# Patient Record
Sex: Female | Born: 1950
Health system: Southern US, Community
[De-identification: ages and names within clinical notes are randomized; demographics above are authoritative.]

## PROBLEM LIST (undated history)

## (undated) DIAGNOSIS — K5900 Constipation, unspecified: Secondary | ICD-10-CM

## (undated) DIAGNOSIS — R3915 Urgency of urination: Secondary | ICD-10-CM

## (undated) DIAGNOSIS — R310 Gross hematuria: Secondary | ICD-10-CM

## (undated) DIAGNOSIS — D494 Neoplasm of unspecified behavior of bladder: Secondary | ICD-10-CM

## (undated) DIAGNOSIS — C801 Malignant (primary) neoplasm, unspecified: Secondary | ICD-10-CM

## (undated) DIAGNOSIS — I10 Essential (primary) hypertension: Secondary | ICD-10-CM

## (undated) DIAGNOSIS — D62 Acute posthemorrhagic anemia: Secondary | ICD-10-CM

## (undated) HISTORY — PX: TUBAL LIGATION: SHX77

---

## 2001-04-20 ENCOUNTER — Emergency Department (HOSPITAL_COMMUNITY): Admission: EM | Admit: 2001-04-20 | Discharge: 2001-04-20 | Payer: Self-pay | Admitting: Emergency Medicine

## 2001-08-09 ENCOUNTER — Emergency Department (HOSPITAL_COMMUNITY): Admission: EM | Admit: 2001-08-09 | Discharge: 2001-08-09 | Payer: Self-pay | Admitting: Emergency Medicine

## 2002-08-13 ENCOUNTER — Emergency Department (HOSPITAL_COMMUNITY): Admission: EM | Admit: 2002-08-13 | Discharge: 2002-08-13 | Payer: Self-pay | Admitting: Emergency Medicine

## 2007-10-15 ENCOUNTER — Emergency Department (HOSPITAL_COMMUNITY): Admission: EM | Admit: 2007-10-15 | Discharge: 2007-10-15 | Payer: Self-pay | Admitting: Emergency Medicine

## 2009-09-13 ENCOUNTER — Emergency Department (HOSPITAL_COMMUNITY): Admission: EM | Admit: 2009-09-13 | Discharge: 2009-09-13 | Payer: Self-pay | Admitting: Emergency Medicine

## 2009-11-12 ENCOUNTER — Emergency Department (HOSPITAL_COMMUNITY): Admission: EM | Admit: 2009-11-12 | Discharge: 2009-11-13 | Payer: Self-pay | Admitting: Emergency Medicine

## 2010-12-15 ENCOUNTER — Emergency Department (HOSPITAL_COMMUNITY)
Admission: EM | Admit: 2010-12-15 | Discharge: 2010-12-15 | Payer: Self-pay | Source: Home / Self Care | Admitting: Emergency Medicine

## 2011-03-08 LAB — POCT I-STAT, CHEM 8
BUN: 21 mg/dL (ref 6–23)
Calcium, Ion: 1.11 mmol/L — ABNORMAL LOW (ref 1.12–1.32)
Creatinine, Ser: 1.1 mg/dL (ref 0.4–1.2)
Glucose, Bld: 112 mg/dL — ABNORMAL HIGH (ref 70–99)
Sodium: 140 mEq/L (ref 135–145)
TCO2: 30 mmol/L (ref 0–100)

## 2011-10-06 LAB — RPR: RPR Ser Ql: NONREACTIVE

## 2011-10-06 LAB — BASIC METABOLIC PANEL
CO2: 29
Calcium: 9.4
Glucose, Bld: 131 — ABNORMAL HIGH
Potassium: 4.1
Sodium: 135

## 2011-10-06 LAB — WET PREP, GENITAL: Trich, Wet Prep: NONE SEEN

## 2011-10-06 LAB — CBC
MCHC: 33.1
MCV: 79.1
RBC: 5.08
RDW: 15.4 — ABNORMAL HIGH

## 2011-10-06 LAB — GC/CHLAMYDIA PROBE AMP, GENITAL: Chlamydia, DNA Probe: NEGATIVE

## 2011-10-06 LAB — DIFFERENTIAL
Basophils Relative: 0
Eosinophils Absolute: 0.1
Monocytes Relative: 6
Neutrophils Relative %: 79 — ABNORMAL HIGH

## 2012-02-18 ENCOUNTER — Emergency Department (HOSPITAL_COMMUNITY)
Admission: EM | Admit: 2012-02-18 | Discharge: 2012-02-18 | Disposition: A | Discharge status: Home / Self Care | Payer: Self-pay | Attending: Emergency Medicine | Admitting: Emergency Medicine

## 2012-02-18 ENCOUNTER — Encounter (HOSPITAL_COMMUNITY): Payer: Self-pay

## 2012-02-18 DIAGNOSIS — S161XXA Strain of muscle, fascia and tendon at neck level, initial encounter: Secondary | ICD-10-CM

## 2012-02-18 DIAGNOSIS — I10 Essential (primary) hypertension: Secondary | ICD-10-CM

## 2012-02-18 DIAGNOSIS — X58XXXA Exposure to other specified factors, initial encounter: Secondary | ICD-10-CM | POA: Insufficient documentation

## 2012-02-18 DIAGNOSIS — S139XXA Sprain of joints and ligaments of unspecified parts of neck, initial encounter: Secondary | ICD-10-CM | POA: Insufficient documentation

## 2012-02-18 HISTORY — DX: Essential (primary) hypertension: I10

## 2012-02-18 LAB — RENAL FUNCTION PANEL
Albumin: 4 g/dL (ref 3.5–5.2)
Chloride: 98 mEq/L (ref 96–112)
GFR calc non Af Amer: >90 mL/min (ref >90)
Potassium: 3.4 mEq/L — ABNORMAL LOW (ref 3.5–5.1)

## 2012-02-18 MED ORDER — CLONIDINE HCL 0.1 MG PO TABS
0.1000 mg | ORAL_TABLET | Freq: Once | ORAL | Status: AC
  Administered 2012-02-18: 0.1 mg via ORAL
  Filled 2012-02-18: qty 1

## 2012-02-18 MED ORDER — OXYCODONE-ACETAMINOPHEN 5-325 MG PO TABS
1.0000 | ORAL_TABLET | Freq: Once | ORAL | Status: AC
  Administered 2012-02-18: 1 via ORAL
  Filled 2012-02-18: qty 1

## 2012-02-18 MED ORDER — CYCLOBENZAPRINE HCL 10 MG PO TABS: 10.0000 mg | ORAL_TABLET | Freq: Two times a day (BID) | ORAL | Status: AC | PRN

## 2012-02-18 MED ORDER — TRAMADOL HCL 50 MG PO TABS: 50.0000 mg | ORAL_TABLET | Freq: Four times a day (QID) | ORAL | Status: AC | PRN

## 2012-02-18 MED ORDER — AMLODIPINE BESYLATE 10 MG PO TABS: 5.0000 mg | ORAL_TABLET | Freq: Every day | ORAL | Status: DC

## 2012-02-18 NOTE — ED Notes (Signed)
Pt aware of HTN no on meds- supposed to be on medication- does not have doctor- pt has no symptoms

## 2012-02-18 NOTE — ED Provider Notes (Signed)
History     CSN: [id number]  Arrival date & time 02/18/12  7829   First MD Initiated Contact with Patient 02/18/12 1826      Chief Complaint  Patient presents with  . Neck Pain    (Consider location/radiation/quality/duration/timing/severity/associated sxs/prior treatment) HPI   Pt presents to the ED with complaints of sleeping wrong on her neck. She woke up yesterday morning with the left side of her neck hurting. It hurts even worse when she moves iit. She denies history of neck problems, denies injury or MVC. The patient also incidentally is noted to be hypertensive at 242/110. The patient has not seen a PCP in 3 years and does not take any medications. She is not having any symptoms of chest pain, SOB, weakness, headache, abdominal pain. Past Medical History  Diagnosis Date  . Hypertension     History reviewed. No pertinent past surgical history.  History reviewed. No pertinent family history.  History  Substance Use Topics  . Smoking status: Never Smoker   . Smokeless tobacco: Not on file  . Alcohol Use: No    OB History    Grav Para Term Preterm Abortions TAB SAB Ect Mult Living                  Review of Systems  All other systems reviewed and are negative.    Allergies  Review of patient's allergies indicates no known allergies.  Home Medications   Current Outpatient Rx  Name Route Sig Dispense Refill  . IBUPROFEN 200 MG PO TABS Oral Take 800 mg by mouth every 6 (six) hours as needed. FOR PAIN    . AMLODIPINE BESYLATE 10 MG PO TABS Oral Take 0.5 tablets (5 mg total) by mouth daily. 30 tablet 0  . CYCLOBENZAPRINE HCL 10 MG PO TABS Oral Take 1 tablet (10 mg total) by mouth 2 (two) times daily as needed for muscle spasms. 20 tablet 0  . TRAMADOL HCL 50 MG PO TABS Oral Take 1 tablet (50 mg total) by mouth every 6 (six) hours as needed for pain. 15 tablet 0    BP 200/90  Pulse 83  Resp 18  Wt 175 lb (79.379 kg)  SpO2 99%  Physical Exam  Nursing  note and vitals reviewed. Constitutional: She is oriented to person, place, and time. She appears well-developed and well-nourished. No distress.  HENT:  Head: Normocephalic and atraumatic.  Eyes: Conjunctivae are normal. Pupils are equal, round, and reactive to light.  Neck: Full passive range of motion without pain. Neck supple. No JVD present. Muscular tenderness present. No tracheal tenderness and no spinous process tenderness present. Decreased range of motion (due to pain) present. No tracheal deviation and no edema present.    Cardiovascular: Normal rate, regular rhythm and normal pulses.   Pulmonary/Chest: Effort normal and breath sounds normal. No stridor. Chest wall is not dull to percussion. She exhibits no tenderness, no crepitus, no edema, no deformity and no retraction.  Abdominal: Soft. Normal appearance and bowel sounds are normal.  Neurological: She is oriented to person, place, and time. She has normal strength. GCS eye subscore is 4. GCS verbal subscore is 5. GCS motor subscore is 6.  Skin: Skin is warm, dry and intact.  Psychiatric: She has a normal mood and affect. Her speech is normal and behavior is normal. Judgment and thought content normal. Cognition and memory are normal.    ED Course  Procedures (including critical care time)  Labs Reviewed  RENAL  FUNCTION PANEL - Abnormal; Notable for the following:    Potassium 3.4 (*)    Glucose, Bld 111 (*)    All other components within normal limits   No results found.   1. Hypertension   2. Neck strain       MDM  I discussed patients blood pressure with Dr. Freida Busman who has advised me to get a Renal Function panel.   Pt is asymptomatic. Given 0.1 of catapress, BP went down to below 200/90. I have started patient on Norvasc 5mg  tab q day. Pt also given tramadol and flexeril for pain. Pt renal function came back normal.     Dorthula Matas, PA 02/18/12 2145

## 2012-02-18 NOTE — Discharge Instructions (Signed)
Arterial Hypertension Arterial hypertension (high blood pressure) is a condition of elevated pressure in your blood vessels. Hypertension over a long period of time is a risk factor for strokes, heart attacks, and heart failure. It is also the leading cause of kidney (renal) failure.  CAUSES   In Adults -- Over 90% of all hypertension has no known cause. This is called essential or primary hypertension. In the other 10% of people with hypertension, the increase in blood pressure is caused by another disorder. This is called secondary hypertension. Important causes of secondary hypertension are:   Heavy alcohol use.   Obstructive sleep apnea.   Hyperaldosterosim (Conn's syndrome).   Steroid use.   Chronic kidney failure.   Hyperparathyroidism.   Medications.   Renal artery stenosis.   Pheochromocytoma.   Cushing's disease.   Coarctation of the aorta.   Scleroderma renal crisis.   Licorice (in excessive amounts).   Drugs (cocaine, methamphetamine).  Your caregiver can explain any items above that apply to you.  In Children -- Secondary hypertension is more common and should always be considered.   Pregnancy -- Few women of childbearing age have high blood pressure. However, up to 10% of them develop hypertension of pregnancy. Generally, this will not harm the woman. It Even be a sign of 3 complications of pregnancy: preeclampsia, HELLP syndrome, and eclampsia. Follow up and control with medication is necessary.  SYMPTOMS   This condition normally does not produce any noticeable symptoms. It is usually found during a routine exam.   Malignant hypertension is a late problem of high blood pressure. It Monger have the following symptoms:   Headaches.   Blurred vision.   End-organ damage (this means your kidneys, heart, lungs, and other organs are being damaged).   Stressful situations can increase the blood pressure. If a person with normal blood pressure has their blood  pressure go up while being seen by their caregiver, this is often termed "white coat hypertension." Its importance is not known. It Alkire be related with eventually developing hypertension or complications of hypertension.   Hypertension is often confused with mental tension, stress, and anxiety.  DIAGNOSIS  The diagnosis is made by 3 separate blood pressure measurements. They are taken at least 1 week apart from each other. If there is organ damage from hypertension, the diagnosis Lemire be made without repeat measurements. Hypertension is usually identified by having blood pressure readings:  Above 140/90 mmHg measured in both arms, at 3 separate times, over a couple weeks.   Over 130/80 mmHg should be considered a risk factor and Grisanti require treatment in patients with diabetes.  Blood pressure readings over 120/80 mmHg are called "pre-hypertension" even in non-diabetic patients. To get a true blood pressure measurement, use the following guidelines. Be aware of the factors that can alter blood pressure readings.  Take measurements at least 1 hour after caffeine.   Take measurements 30 minutes after smoking and without any stress. This is another reason to quit smoking - it raises your blood pressure.   Use a proper cuff size. Ask your caregiver if you are not sure about your cuff size.   Most home blood pressure cuffs are automatic. They will measure systolic and diastolic pressures. The systolic pressure is the pressure reading at the start of sounds. Diastolic pressure is the pressure at which the sounds disappear. If you are elderly, measure pressures in multiple postures. Try sitting, lying or standing.   Sit at rest for a minimum of   5 minutes before taking measurements.   You should not be on any medications like decongestants. These are found in many cold medications.   Record your blood pressure readings and review them with your caregiver.  If you have hypertension:  Your caregiver  may do tests to be sure you do not have secondary hypertension (see "causes" above).   Your caregiver may also look for signs of metabolic syndrome. This is also called Syndrome X or Insulin Resistance Syndrome. You may have this syndrome if you have type 2 diabetes, abdominal obesity, and abnormal blood lipids in addition to hypertension.   Your caregiver will take your medical and family history and perform a physical exam.   Diagnostic tests may include blood tests (for glucose, cholesterol, potassium, and kidney function), a urinalysis, or an EKG. Other tests may also be necessary depending on your condition.  PREVENTION  There are important lifestyle issues that you can adopt to reduce your chance of developing hypertension:  Maintain a normal weight.   Limit the amount of salt (sodium) in your diet.   Exercise often.   Limit alcohol intake.   Get enough potassium in your diet. Discuss specific advice with your caregiver.   Follow a DASH diet (dietary approaches to stop hypertension). This diet is rich in fruits, vegetables, and low-fat dairy products, and avoids certain fats.  PROGNOSIS  Essential hypertension cannot be cured. Lifestyle changes and medical treatment can lower blood pressure and reduce complications. The prognosis of secondary hypertension depends on the underlying cause. Many people whose hypertension is controlled with medicine or lifestyle changes can live a normal, healthy life.  RISKS AND COMPLICATIONS  While high blood pressure alone is not an illness, it often requires treatment due to its short- and long-term effects on many organs. Hypertension increases your risk for:  CVAs or strokes (cerebrovascular accident).   Heart failure due to chronically high blood pressure (hypertensive cardiomyopathy).   Heart attack (myocardial infarction).   Damage to the retina (hypertensive retinopathy).   Kidney failure (hypertensive nephropathy).  Your caregiver can  explain list items above that apply to you. Treatment of hypertension can significantly reduce the risk of complications. TREATMENT   For overweight patients, weight loss and regular exercise are recommended. Physical fitness lowers blood pressure.   Mild hypertension is usually treated with diet and exercise. A diet rich in fruits and vegetables, fat-free dairy products, and foods low in fat and salt (sodium) can help lower blood pressure. Decreasing salt intake decreases blood pressure in a 1/3 of people.   Stop smoking if you are a smoker.  The steps above are highly effective in reducing blood pressure. While these actions are easy to suggest, they are difficult to achieve. Most patients with moderate or severe hypertension end up requiring medications to bring their blood pressure down to a normal level. There are several classes of medications for treatment. Blood pressure pills (antihypertensives) will lower blood pressure by their different actions. Lowering the blood pressure by 10 mmHg may decrease the risk of complications by as much as 25%. The goal of treatment is effective blood pressure control. This will reduce your risk for complications. Your caregiver will help you determine the best treatment for you according to your lifestyle. What is excellent treatment for one person, may not be for you. HOME CARE INSTRUCTIONS   Do not smoke.   Follow the lifestyle changes outlined in the "Prevention" section.   If you are on medications, follow the directions   carefully. Blood pressure medications must be taken as prescribed. Skipping doses reduces their benefit. It also puts you at risk for problems.   Follow up with your caregiver, as directed.   If you are asked to monitor your blood pressure at home, follow the guidelines in the "Diagnosis" section above.  SEEK MEDICAL CARE IF:   You think you are having medication side effects.   You have recurrent headaches or lightheadedness.     You have swelling in your ankles.   You have trouble with your vision.  SEEK IMMEDIATE MEDICAL CARE IF:   You have sudden onset of chest pain or pressure, difficulty breathing, or other symptoms of a heart attack.   You have a severe headache.   You have symptoms of a stroke (such as sudden weakness, difficulty speaking, difficulty walking).  MAKE SURE YOU:   Understand these instructions.   Will watch your condition.   Will get help right away if you are not doing well or get worse.  Document Released: 12020/09/905 Document Revised: 08/25/2011 Document Reviewed: 07/13/2007 [facility] Patient Information 2012 [place], [place].Cervical Sprain and Strain   A cervical sprain is an injury to the neck. The injury can include either over-stretching or even small tears in the ligaments that hold the bones of the neck in place. A strain affects muscles and tendons. Minor injuries usually only involve ligaments and muscles. Because the different parts of the neck are so close together, more severe injuries can involve both sprain and strain. These injuries can affect the muscles, ligaments, tendons, discs, and nerves in the neck. CAUSES  An injury may be the result of a direct blow or from certain habits that can lead to the symptoms noted above.  Injury from:   Contact sports (such as football, rugby, wrestling, hockey, auto racing, gymnastics, diving, martial arts, and boxing).   Motor vehicle accidents.   Whiplash injuries (see image at right). These are common. They occur when the neck is forcefully whipped or forced backward and/or forward.   Falls.   Lifestyle or awkward postures:   Cradling a telephone between the ear and shoulder.   Sitting in a chair that offers no support.   Working at an [employer] park manager station.   Activities that require hours of repeated or long periods of looking up (stretching the neck backward) or looking down (bending the head/neck forward).   SYMPTOMS   Pain, soreness, stiffness, or burning sensation in the front, back, or sides of the neck. This may develop immediately after injury. Onset of discomfort may also develop slowly and not begin for 24 hours or more.   Shoulder and/or upper back pain.   Limits to the normal movement of the neck.   Headache.   Dizziness.   Weakness and/or abnormal sensation (such as numbness or tingling) of one or both arms and/or hands.   Muscle spasm.   Difficulty with swallowing or chewing.   Tenderness and swelling at the injury site.  DIAGNOSIS  Most of the time, your caregiver can diagnose this problem with a careful history and examination. The history will include information about known problems (such as arthritis in the neck) or a previous neck injury. X-rays may be ordered to find out if there is a different problem. X-rays can also help to find problems with the bones of the neck not related to the injury or current symptoms. TREATMENT  Several treatment options are available to help pain, spasm, and other symptoms. They include:  Cold helps relieve pain and reduce inflammation. Cold should be applied for 10 to 15 minutes every 2 to 3 hours after any activity that aggravates your symptoms. Use ice packs or an ice massage. Place a towel or cloth in between your skin and the ice pack.   Medication:   Only take over-the-counter or prescription medicines for pain, discomfort, or fever as directed by your caregiver.   Pain relievers or muscle relaxants may be prescribed. Use only as directed and only as much as you need.   Change in the activity that caused the problem. This might include using a headset with a telephone so that the phone is not propped between your ear and shoulder.   Neck collar. Your caregiver may recommend temporary use of a soft cervical collar.   Work station. Changes may be needed in your work place. A better sitting position and/or better posture during work  may be part of your treatment.   Physical Therapy. Your caregiver may recommend physical therapy. This can include instructions in the use of stretching and strengthening exercises. Improvement in posture is important. Exercises and posture training can help stabilize the neck and strengthen muscles and keep symptoms from returning.  HOME CARE INSTRUCTIONS  Other than formal physical therapy, all treatments above can be done at home. Even when not at work, it is important to be conscious of your posture and of activities that can cause a return of symptoms. Most cervical sprains and/or strains are better in 1-3 weeks. As you improve and increase activities, doing a warm up and stretching before the activity will help prevent recurrent problems. SEEK MEDICAL CARE IF:   Pain is not effectively controlled with medication.   You feel unable to decrease pain medication over time as planned.   Activity level is not improving as planned and/or expected.  SEEK IMMEDIATE MEDICAL CARE IF:   While using medication, you develop any bleeding, stomach upset, or signs of an allergic reaction.   Symptoms get worse, become intolerable, and are not helped by medications.   New, unexplained symptoms develop.   You experience numbness, tingling, weakness, or paralysis of any part of your body.  MAKE SURE YOU:   Understand these instructions.   Will watch your condition.   Will get help right away if you are not doing well or get worse.  Document Released: 10/10/2007 Document Revised: 08/25/2011 Document Reviewed: 10/10/2007 [facility] Patient Information 2012 [place], [place].

## 2012-02-18 NOTE — ED Notes (Signed)
Pt denies injury- HX of the same.  EDPA Tiffany present for evaluation

## 2012-02-19 NOTE — ED Provider Notes (Signed)
Medical screening examination/treatment/procedure(s) were performed by non-physician practitioner and as supervising physician I was immediately available for consultation/collaboration.  Toy Baker, MD 02/19/12 2026

## 2013-07-13 ENCOUNTER — Other Ambulatory Visit: Payer: Self-pay

## 2013-07-13 ENCOUNTER — Encounter (HOSPITAL_COMMUNITY): Payer: Self-pay | Admitting: *Deleted

## 2013-07-13 ENCOUNTER — Emergency Department (HOSPITAL_COMMUNITY)
Admission: EM | Admit: 2013-07-13 | Discharge: 2013-07-13 | Disposition: A | Discharge status: Home / Self Care | Payer: BC Managed Care – PPO | Attending: Emergency Medicine | Admitting: Emergency Medicine

## 2013-07-13 DIAGNOSIS — R5381 Other malaise: Secondary | ICD-10-CM | POA: Insufficient documentation

## 2013-07-13 DIAGNOSIS — I10 Essential (primary) hypertension: Secondary | ICD-10-CM

## 2013-07-13 DIAGNOSIS — R197 Diarrhea, unspecified: Secondary | ICD-10-CM | POA: Insufficient documentation

## 2013-07-13 DIAGNOSIS — R110 Nausea: Secondary | ICD-10-CM | POA: Insufficient documentation

## 2013-07-13 LAB — BASIC METABOLIC PANEL
CO2: 29 mEq/L (ref 19–32)
GFR calc non Af Amer: 79 mL/min — ABNORMAL LOW (ref >90)
Glucose, Bld: 137 mg/dL — ABNORMAL HIGH (ref 70–99)
Potassium: 4.3 mEq/L (ref 3.5–5.1)
Sodium: 141 mEq/L (ref 135–145)

## 2013-07-13 LAB — CBC WITH DIFFERENTIAL/PLATELET
Eosinophils Absolute: 0.2 10*3/uL (ref 0.0–0.7)
Lymphocytes Relative: 25 % (ref 12–46)
Lymphs Abs: 2.6 10*3/uL (ref 0.7–4.0)
Neutrophils Relative %: 65 % (ref 43–77)
Platelets: 272 10*3/uL (ref 150–400)
RBC: 5.48 MIL/uL — ABNORMAL HIGH (ref 3.87–5.11)
WBC: 10.2 10*3/uL (ref 4.0–10.5)

## 2013-07-13 MED ORDER — SODIUM CHLORIDE 0.9 % IV BOLUS (SEPSIS)
1000.0000 mL | Freq: Once | INTRAVENOUS | Status: AC
  Administered 2013-07-13: 1000 mL via INTRAVENOUS

## 2013-07-13 MED ORDER — HYDROCHLOROTHIAZIDE 25 MG PO TABS: 25.0000 mg | ORAL_TABLET | Freq: Every day | ORAL | Status: DC

## 2013-07-13 MED ORDER — LABETALOL HCL 5 MG/ML IV SOLN
20.0000 mg | Freq: Once | INTRAVENOUS | Status: AC
  Administered 2013-07-13: 20 mg via INTRAVENOUS
  Filled 2013-07-13: qty 4

## 2013-07-13 NOTE — ED Notes (Signed)
Pt verbalizes understanding 

## 2013-07-13 NOTE — ED Provider Notes (Signed)
History    CSN: [id number] Arrival date & time 07/13/13  1452  First MD Initiated Contact with Patient 07/13/13 1621     Chief Complaint  Patient presents with  . hypertension 252/104   . general malaise    (Consider location/radiation/quality/duration/timing/severity/associated sxs/prior Treatment) Patient is a 62 y.o. female presenting with general illness.  Illness Quality:  Generalized malaise Severity:  Moderate Onset quality:  Gradual Duration:  3 days Timing:  Constant Progression:  Worsening Chronicity:  New Context:  Has been out of BP meds for about 1 year Relieved by:  Nothing Worsened by:  Nothing Associated symptoms: diarrhea and nausea   Associated symptoms: no abdominal pain, no chest pain, no congestion, no cough, no fever, no headaches, no shortness of breath and no vomiting    Past Medical History  Diagnosis Date  . Hypertension    History reviewed. No pertinent past surgical history. History reviewed. No pertinent family history. History  Substance Use Topics  . Smoking status: Never Smoker   . Smokeless tobacco: Not on file  . Alcohol Use: No   OB History   Grav Para Term Preterm Abortions TAB SAB Ect Mult Living                 Review of Systems  Constitutional: Negative for fever.  HENT: Negative for congestion.   Eyes: Negative for visual disturbance.  Respiratory: Negative for cough and shortness of breath.   Cardiovascular: Negative for chest pain.  Gastrointestinal: Positive for nausea and diarrhea. Negative for vomiting and abdominal pain.  Neurological: Negative for dizziness and headaches.  All other systems reviewed and are negative.    Allergies  Review of patient's allergies indicates no known allergies.  Home Medications  No current outpatient prescriptions on file. BP 180/136  Pulse 101  Temp(Src) 99.6 F (37.6 C) (Oral)  Resp 16  SpO2 98% Physical Exam  Nursing note and vitals reviewed. Constitutional: She is  oriented to person, place, and time. She appears well-developed and well-nourished. No distress.  HENT:  Head: Normocephalic and atraumatic.  Mouth/Throat: Oropharynx is clear and moist.  Eyes: Conjunctivae are normal. Pupils are equal, round, and reactive to light. No scleral icterus.  Fundus not well visualized on right, no papilledema on left.  Neck: Neck supple.  Cardiovascular: Normal rate, regular rhythm, normal heart sounds and intact distal pulses.   No murmur heard. Pulmonary/Chest: Effort normal and breath sounds normal. No stridor. No respiratory distress. She has no rales.  Abdominal: Soft. Bowel sounds are normal. She exhibits no distension. There is no tenderness.  Musculoskeletal: Normal range of motion.  Neurological: She is alert and oriented to person, place, and time.  Skin: Skin is warm and dry. No rash noted.  Psychiatric: She has a normal mood and affect. Her behavior is normal.    ED Course  Procedures (including critical care time) Labs Reviewed  CBC WITH DIFFERENTIAL - Abnormal; Notable for the following:    RBC 5.48 (*)    All other components within normal limits  BASIC METABOLIC PANEL - Abnormal; Notable for the following:    Glucose, Bld 137 (*)    GFR calc non Af Amer 79 (*)    All other components within normal limits  TROPONIN I   No results found.  EKG - NSR, rate 89, normal axis, normal intervals, no ST/T changes, no priors.    1. Hypertension   2. Malaise     MDM  62 yo female with  general malaise found to be hypertensive.  No symptoms or signs of end organ damage.  EKG unremarkable, labs unremarkable.  Pt very well appearing with benign exam.  Given dose of labetalol with response in BPs.  I suspect that she has dramatically elevated blood pressure at baseline in setting of being off of her BP meds for approx 1 year.  Have initiated HCTZ and provided resources for further outpatient management.  Return precautions given.   Candyce Churn, MD 07/14/13 [phone]

## 2013-07-13 NOTE — ED Notes (Addendum)
Pt reports she has been under a lot of stress. Has had 7 friends die in the last month. Pt has hx of HTN. Pt has not been to a doctor in over 2 years. Does not take BP medications. Denies pain, but has numbness in right hand since yesterday. Reports general malaise x3 days.

## 2016-11-24 ENCOUNTER — Observation Stay (HOSPITAL_COMMUNITY)
Admission: EM | Admit: 2016-11-24 | Discharge: 2016-11-26 | Disposition: A | Discharge status: Home / Self Care | Payer: BLUE CROSS/BLUE SHIELD | Attending: Emergency Medicine | Admitting: Emergency Medicine

## 2016-11-24 ENCOUNTER — Encounter (HOSPITAL_COMMUNITY): Payer: Self-pay | Admitting: *Deleted

## 2016-11-24 DIAGNOSIS — I10 Essential (primary) hypertension: Secondary | ICD-10-CM | POA: Diagnosis not present

## 2016-11-24 DIAGNOSIS — R319 Hematuria, unspecified: Secondary | ICD-10-CM

## 2016-11-24 DIAGNOSIS — D62 Acute posthemorrhagic anemia: Secondary | ICD-10-CM | POA: Diagnosis present

## 2016-11-24 DIAGNOSIS — Z79899 Other long term (current) drug therapy: Secondary | ICD-10-CM | POA: Diagnosis not present

## 2016-11-24 DIAGNOSIS — R310 Gross hematuria: Secondary | ICD-10-CM | POA: Diagnosis present

## 2016-11-24 DIAGNOSIS — D500 Iron deficiency anemia secondary to blood loss (chronic): Secondary | ICD-10-CM | POA: Diagnosis not present

## 2016-11-24 DIAGNOSIS — D649 Anemia, unspecified: Secondary | ICD-10-CM

## 2016-11-24 HISTORY — DX: Acute posthemorrhagic anemia: D62

## 2016-11-24 LAB — URINALYSIS, ROUTINE W REFLEX MICROSCOPIC
BILIRUBIN URINE: NEGATIVE
Glucose, UA: NEGATIVE mg/dL
KETONES UR: NEGATIVE mg/dL
NITRITE: NEGATIVE
PROTEIN: 100 mg/dL — AB
Specific Gravity, Urine: 1.017 (ref 1.005–1.030)
pH: 5.5 (ref 5.0–8.0)

## 2016-11-24 LAB — BASIC METABOLIC PANEL
ANION GAP: 6 (ref 5–15)
BUN: 7 mg/dL (ref 6–20)
CALCIUM: 9 mg/dL (ref 8.9–10.3)
CO2: 26 mmol/L (ref 22–32)
Chloride: 107 mmol/L (ref 101–111)
Creatinine, Ser: 0.89 mg/dL (ref 0.44–1.00)
GLUCOSE: 119 mg/dL — AB (ref 65–99)
Potassium: 3.7 mmol/L (ref 3.5–5.1)
SODIUM: 139 mmol/L (ref 135–145)

## 2016-11-24 LAB — CBC
HCT: 22.4 % — ABNORMAL LOW (ref 36.0–46.0)
Hemoglobin: 6.5 g/dL — CL (ref 12.0–15.0)
MCH: 19.9 pg — ABNORMAL LOW (ref 26.0–34.0)
MCHC: 29.9 g/dL — ABNORMAL LOW (ref 30.0–36.0)
MCV: 66.5 fL — AB (ref 78.0–100.0)
PLATELETS: 357 10*3/uL (ref 150–400)
RBC: 3.37 MIL/uL — AB (ref 3.87–5.11)
RDW: 17.2 % — AB (ref 11.5–15.5)
WBC: 7.1 10*3/uL (ref 4.0–10.5)

## 2016-11-24 LAB — URINE MICROSCOPIC-ADD ON

## 2016-11-24 MED ORDER — SODIUM CHLORIDE 0.9 % IV SOLN
10.0000 mL/h | Freq: Once | INTRAVENOUS | Status: AC
  Administered 2016-11-25: 10 mL/h via INTRAVENOUS

## 2016-11-24 NOTE — ED Triage Notes (Signed)
Pt c/o hematuria x 4 days with blood clots. Pt also reports fatigue. Pt noted to be hypertension, has been out of blood pressure medications for years

## 2016-11-24 NOTE — ED Provider Notes (Signed)
[facility] DEPT Provider Note   CSN: [id number] Arrival date & time: 11/24/16  2002     History   Chief Complaint Chief Complaint  Patient presents with  . Hematuria    HPI Faith [name] is a 65 y.o. female with a hx of HTN (unconctrolled) presents to the Emergency Department complaining of gradual, large volume hematuria with clots onset 4 days ago and resolving 2 days ago. Pt reports she has had approx 6 mos of increased urinary frequency without dysuria, hematuria (until 4 days ago), polyuria or urinary urgency.  Pt reports that over the last 6 mos she occasionally saw blood on the tissue after urination.  No vaginal bleeding.  She reports associated fatigue x 1 week.  She reports no PCP for several years and no medications for her HTN.  She denies SOB, DOE or CP.  Nothing makes her ssx better or worse.  No fever, chills, abd pain, flank pain.     The history is provided by the patient and medical records. No language interpreter was used.    Past Medical History:  Diagnosis Date  . Hypertension     Patient Active Problem List   Diagnosis Date Noted  . Acute blood loss anemia 11/25/2016  . HTN (hypertension) 11/25/2016  . Gross hematuria 11/25/2016    History reviewed. No pertinent surgical history.  OB History    No data available       Home Medications    Prior to Admission medications   Medication Sig Start Date End Date Taking? Authorizing Provider  hydrochlorothiazide (HYDRODIURIL) 25 MG tablet Take 1 tablet (25 mg total) by mouth daily. Patient not taking: Reported on 11/24/2016 07/13/13   Serita Grit, MD    Family History Family History  Problem Relation Age of Onset  . Lung cancer Father     Social History Social History  Substance Use Topics  . Smoking status: Never Smoker  . Smokeless tobacco: Never Used  . Alcohol use No     Allergies   Patient has no known allergies.   Review of Systems Review of Systems  Constitutional:  Positive for fatigue. Negative for fever.  Genitourinary: Positive for frequency ( x6 mos) and hematuria. Negative for dysuria, flank pain, pelvic pain and vaginal bleeding.  All other systems reviewed and are negative.    Physical Exam Updated Vital Signs BP (!) 192/163   Pulse 87   Temp 98.7 F (37.1 C) (Oral)   Resp 18   SpO2 100%   Physical Exam  Constitutional: She appears well-developed and well-nourished. No distress.  Awake, alert, nontoxic appearance  HENT:  Head: Normocephalic and atraumatic.  Mouth/Throat: Oropharynx is clear and moist. No oropharyngeal exudate.  Pale mucous membranes  Eyes: Conjunctivae are normal. No scleral icterus.  Neck: Normal range of motion. Neck supple.  Cardiovascular: Regular rhythm and intact distal pulses.  Tachycardia present.   Pulses:      Radial pulses are 2+ on the right side, and 2+ on the left side.       Dorsalis pedis pulses are 2+ on the right side, and 2+ on the left side.  Pulmonary/Chest: Effort normal and breath sounds normal. No respiratory distress. She has no wheezes.  Equal chest expansion  Abdominal: Soft. Bowel sounds are normal. She exhibits no mass. There is no tenderness. There is no rebound and no guarding.  Musculoskeletal: Normal range of motion. She exhibits no edema.  Neurological: She is alert.  Speech is clear and  goal oriented Moves extremities without ataxia  Skin: Skin is warm and dry. She is not diaphoretic. There is pallor.  Psychiatric: She has a normal mood and affect.  Nursing note and vitals reviewed.    ED Treatments / Results  Labs (all labs ordered are listed, but only abnormal results are displayed) Labs Reviewed  URINALYSIS, ROUTINE W REFLEX MICROSCOPIC (NOT AT [facility]) - Abnormal; Notable for the following:       Result Value   APPearance CLOUDY (*)    Hgb urine dipstick LARGE (*)    Protein, ur 100 (*)    Leukocytes, UA SMALL (*)    All other components within normal limits  CBC -  Abnormal; Notable for the following:    RBC 3.37 (*)    Hemoglobin 6.5 (*)    HCT 22.4 (*)    MCV 66.5 (*)    MCH 19.9 (*)    MCHC 29.9 (*)    RDW 17.2 (*)    All other components within normal limits  BASIC METABOLIC PANEL - Abnormal; Notable for the following:    Glucose, Bld 119 (*)    All other components within normal limits  URINE MICROSCOPIC-ADD ON - Abnormal; Notable for the following:    Squamous Epithelial / LPF 0-5 (*)    Bacteria, UA FEW (*)    All other components within normal limits  CBC - Abnormal; Notable for the following:    RBC 3.13 (*)    Hemoglobin 6.0 (*)    HCT 20.7 (*)    MCV 66.1 (*)    MCH 19.2 (*)    MCHC 29.0 (*)    RDW 17.4 (*)    All other components within normal limits  URINE CULTURE  PROTIME-INR  APTT  POC OCCULT BLOOD, ED  PREPARE RBC (CROSSMATCH)  TYPE AND SCREEN  ABO/RH    Radiology Ct Abdomen Pelvis W Contrast  Result Date: 11/25/2016 CLINICAL DATA:  Acute onset of hematuria.  Initial encounter. EXAM: CT ABDOMEN AND PELVIS WITH CONTRAST TECHNIQUE: Multidetector CT imaging of the abdomen and pelvis was performed using the standard protocol following bolus administration of intravenous contrast. CONTRAST:  100 mL ISOVUE-300 IOPAMIDOL (ISOVUE-300) INJECTION 61% COMPARISON:  None. FINDINGS: Lower chest: The visualized lung bases are grossly clear. The visualized portions of the mediastinum are unremarkable. Hepatobiliary: The liver is unremarkable in appearance. The gallbladder is unremarkable in appearance. The common bile duct remains normal in caliber. Pancreas: The pancreas is within normal limits. Spleen: The spleen is unremarkable in appearance. Adrenals/Urinary Tract: The adrenal glands are unremarkable in appearance. There is minimal right-sided hydronephrosis, with dilatation of the right ureter. This appears to reflect a partially obstructing mass at the right side of the bladder, overlying the right vesicoureteral junction. A large  left renal cyst is noted, measuring 5.3 cm in size. No nonobstructing renal stones are identified. No perinephric stranding is seen. Stomach/Bowel: The stomach is unremarkable in appearance. The small bowel is within normal limits. The appendix is normal in caliber, without evidence of appendicitis. The colon is unremarkable in appearance. Vascular/Lymphatic: Scattered calcification is seen along the abdominal aorta and its branches. The abdominal aorta is otherwise grossly unremarkable. The inferior vena cava is grossly unremarkable. No retroperitoneal lymphadenopathy is seen. No pelvic sidewall lymphadenopathy is identified. Reproductive: There appears to be a lobulated mass at the right posterior aspect of the bladder, measuring 4.7 x 2.3 x 3.2 cm. The bladder is otherwise unremarkable in appearance. The uterus is grossly unremarkable.  The ovaries are relatively symmetric. No suspicious adnexal masses are seen. Other: No additional soft tissue abnormalities are seen. Musculoskeletal: No acute osseous abnormalities are identified. Vacuum phenomenon is noted along the lumbar spine. The visualized musculature is unremarkable in appearance. IMPRESSION: 1. Apparent lobulated mass at the right posterior wall of the bladder, measuring 4.7 x 2.3 x 3.2 cm. This could reflect primary bladder malignancy, or possibly a large fungal ball. Cystoscopy and tissue diagnosis are recommended for further evaluation. 2. Minimal right-sided hydronephrosis, with dilatation of the right ureter. This appears to reflect the bladder mass, which overlies the right vesicoureteral junction. 3. Large left renal cyst noted. 4. Scattered aortic atherosclerosis. Electronically Signed   By: Garald Balding M.D.   On: 11/25/2016 01:40    Procedures Procedures (including critical care time)  CRITICAL CARE Performed by: Abigail Butts Total critical care time: 45 minutes Critical care time was exclusive of separately billable procedures  and treating other patients. Critical care was necessary to treat or prevent imminent or life-threatening deterioration. Critical care was time spent personally by me on the following activities: development of treatment plan with patient and/or surrogate as well as nursing, discussions with consultants, evaluation of patient's response to treatment, examination of patient, obtaining history from patient or surrogate, ordering and performing treatments and interventions, ordering and review of laboratory studies, ordering and review of radiographic studies, pulse oximetry and re-evaluation of patient's condition.   Medications Ordered in ED Medications  amLODipine (NORVASC) tablet 5 mg (5 mg Oral Given 11/25/16 0318)  hydrALAZINE (APRESOLINE) injection 10 mg (10 mg Intravenous Given 11/25/16 0556)  0.9 %  sodium chloride infusion (10 mL/hr Intravenous Transfusing/Transfer 11/25/16 0457)  iopamidol (ISOVUE-300) 61 % injection (100 mLs Intravenous Contrast Given 11/25/16 0110)     Initial Impression / Assessment and Plan / ED Course  I have reviewed the triage vital signs and the nursing notes.  Pertinent labs & imaging results that were available during my care of the patient were reviewed by me and considered in my medical decision making (see chart for details).  Clinical Course as of Nov 25 706  Wed Nov 24, 2016  2240 HTN noted, pt with hx of same and not on any medications BP: (!) 230/76 [HM]  2241 Significant anemia, no hx of same Hemoglobin: (!!) 6.5 [HM]  2241 normal Platelets: 357 [HM]  2241 No leukocytosis WBC: 7.1 [HM]    Clinical Course User Index [HM] Son Barkan, PA-C    Pt presents With gross hematuria and significant anemia. Patient also hypertension, uncontrolled. Hemoglobin 6.5 and blood initiated. CT scan shows lobulated mass at the right posterior wall of the bladder.  Patient admitted for further evaluation and treatment.  Final Clinical Impressions(s) / ED  Diagnoses   Final diagnoses:  Hematuria, unspecified type  Symptomatic anemia  Iron deficiency anemia due to chronic blood loss    New Prescriptions Current Discharge Medication List       Abigail Butts, PA-C 11/25/16 [id number]    Carmin Muskrat, MD 11/29/16 [phone]

## 2016-11-24 NOTE — ED Notes (Signed)
Lab called critical lab hgb 6.5 reported to EDP.

## 2016-11-25 ENCOUNTER — Encounter (HOSPITAL_COMMUNITY): Payer: Self-pay | Admitting: Internal Medicine

## 2016-11-25 ENCOUNTER — Observation Stay (HOSPITAL_COMMUNITY): Payer: BLUE CROSS/BLUE SHIELD

## 2016-11-25 DIAGNOSIS — I10 Essential (primary) hypertension: Secondary | ICD-10-CM | POA: Diagnosis not present

## 2016-11-25 DIAGNOSIS — D62 Acute posthemorrhagic anemia: Secondary | ICD-10-CM

## 2016-11-25 DIAGNOSIS — R310 Gross hematuria: Secondary | ICD-10-CM | POA: Diagnosis not present

## 2016-11-25 LAB — CBC
HEMATOCRIT: 20.7 % — AB (ref 36.0–46.0)
HEMATOCRIT: 27.4 % — AB (ref 36.0–46.0)
HEMOGLOBIN: 6 g/dL — AB (ref 12.0–15.0)
Hemoglobin: 8.7 g/dL — ABNORMAL LOW (ref 12.0–15.0)
MCH: 19.2 pg — AB (ref 26.0–34.0)
MCH: 22.3 pg — ABNORMAL LOW (ref 26.0–34.0)
MCHC: 29 g/dL — ABNORMAL LOW (ref 30.0–36.0)
MCHC: 31.8 g/dL (ref 30.0–36.0)
MCV: 66.1 fL — ABNORMAL LOW (ref 78.0–100.0)
MCV: 70.1 fL — AB (ref 78.0–100.0)
Platelets: 307 10*3/uL (ref 150–400)
Platelets: 323 10*3/uL (ref 150–400)
RBC: 3.13 MIL/uL — AB (ref 3.87–5.11)
RBC: 3.91 MIL/uL (ref 3.87–5.11)
RDW: 17.4 % — ABNORMAL HIGH (ref 11.5–15.5)
RDW: 19.7 % — AB (ref 11.5–15.5)
WBC: 7.7 10*3/uL (ref 4.0–10.5)
WBC: 8.5 10*3/uL (ref 4.0–10.5)

## 2016-11-25 LAB — PROTIME-INR
INR: 1.08
Prothrombin Time: 14.1 seconds (ref 11.4–15.2)

## 2016-11-25 LAB — APTT: aPTT: 25 seconds (ref 24–36)

## 2016-11-25 LAB — ABO/RH: ABO/RH(D): O POS

## 2016-11-25 LAB — PREPARE RBC (CROSSMATCH)

## 2016-11-25 LAB — POC OCCULT BLOOD, ED: FECAL OCCULT BLD: NEGATIVE

## 2016-11-25 MED ORDER — INFLUENZA VAC SPLIT QUAD 0.5 ML IM SUSY
0.5000 mL | PREFILLED_SYRINGE | INTRAMUSCULAR | Status: AC
  Administered 2016-11-26: 0.5 mL via INTRAMUSCULAR
  Filled 2016-11-25: qty 0.5

## 2016-11-25 MED ORDER — AMLODIPINE BESYLATE 5 MG PO TABS
5.0000 mg | ORAL_TABLET | Freq: Once | ORAL | Status: AC
  Administered 2016-11-25: 5 mg via ORAL
  Filled 2016-11-25: qty 1

## 2016-11-25 MED ORDER — AMLODIPINE BESYLATE 10 MG PO TABS
10.0000 mg | ORAL_TABLET | Freq: Every day | ORAL | Status: DC
  Administered 2016-11-26: 10 mg via ORAL
  Filled 2016-11-25: qty 1

## 2016-11-25 MED ORDER — IOPAMIDOL (ISOVUE-300) INJECTION 61%
INTRAVENOUS | Status: AC
  Administered 2016-11-25: 100 mL via INTRAVENOUS
  Filled 2016-11-25: qty 100

## 2016-11-25 MED ORDER — ALUM & MAG HYDROXIDE-SIMETH 200-200-20 MG/5ML PO SUSP
30.0000 mL | Freq: Four times a day (QID) | ORAL | Status: DC | PRN
  Administered 2016-11-25: 30 mL via ORAL
  Filled 2016-11-25: qty 30

## 2016-11-25 MED ORDER — ZOLPIDEM TARTRATE 5 MG PO TABS
5.0000 mg | ORAL_TABLET | Freq: Once | ORAL | Status: AC
  Administered 2016-11-25: 5 mg via ORAL
  Filled 2016-11-25: qty 1

## 2016-11-25 MED ORDER — AMLODIPINE BESYLATE 5 MG PO TABS
5.0000 mg | ORAL_TABLET | Freq: Every day | ORAL | Status: DC
  Administered 2016-11-25: 5 mg via ORAL
  Filled 2016-11-25: qty 1

## 2016-11-25 MED ORDER — HYDRALAZINE HCL 20 MG/ML IJ SOLN
10.0000 mg | INTRAMUSCULAR | Status: DC | PRN
  Administered 2016-11-25: 10 mg via INTRAVENOUS
  Filled 2016-11-25: qty 1

## 2016-11-25 NOTE — ED Notes (Signed)
Attempted to call report x2

## 2016-11-25 NOTE — Care Management Note (Signed)
Case Management Note  Patient Details  Name: Faith [name] MRN: [id number] Date of Birth: December 29, 1950  Subjective/Objective:    Acute blood loss anemia, gross hematuria d/t bladder tumor                Action/Plan: Discharge Planning: NCM spoke to pt and lives at home with adult son, Bobby Rumpf. Pt states she was independent prior to admission. Still works part-time. No problems affording medications. Provided info on locating PCP. Pt plans to contact Dr. Glendale Chard for new pt appt.    Expected Discharge Date:               Expected Discharge Plan:  Home/Self Care  In-House Referral:  NA  Discharge planning Services  CM Consult  Post Acute Care Choice:  NA Choice offered to:  NA  DME Arranged:  N/A DME Agency:  NA  HH Arranged:  NA HH Agency:  NA  Status of Service:  Completed, signed off  If discussed at [place] of Stay Meetings, dates discussed:    Additional Comments:  Erenest Rasher, RN 11/25/2016, 12:03 PM

## 2016-11-25 NOTE — ED Notes (Signed)
Attempted to call report x 1  

## 2016-11-25 NOTE — Progress Notes (Signed)
[facility] PROGRESS NOTE  Faith [name] [id number] DOB: [birth year] DOA: 11/24/2016  PCP: No PCP Per Patient  Brief History/Interval Summary: 65 year old African-American female with a past medical history of hypertension for which she is not taking any medications at this time, who presented to the emergency department with complaints of hematuria. She also had complains of fatigue. Patient was found to be quite anemic. She was hospitalized for further management.  Reason for Visit: Acute blood loss anemia  Consultants: Phone discussion with urology, Dr. Alyson Ingles  Procedures: None yet  Antibiotics: None  Subjective/Interval History: Patient continues to feel fatigued. Has not had any further blood in the urine. Denies any abdominal pain. Denies any nausea or vomiting. Denies any vaginal bleeding or discharge.  ROS: Denies any chest pain or shortness of breath.  Objective:  Vital Signs  Vitals:   11/25/16 0556 11/25/16 0600 11/25/16 0615 11/25/16 0844  BP: (!) 193/79 (!) 184/72 (!) 184/72 (!) 176/69  Pulse: 77 77 80 85  Resp: 19 14 18 19   Temp:  98.5 F (36.9 C) 98.9 F (37.2 C) 98.8 F (37.1 C)  TempSrc:   Oral   SpO2: 97% 99% 99% 98%  Weight:      Height:        Intake/Output Summary (Last 24 hours) at 11/25/16 1124 Last data filed at 11/25/16 0840  Gross per 24 hour  Intake              700 ml  Output                0 ml  Net              700 ml   Filed Weights   11/25/16 0531  Weight: 84.3 kg (185 lb 13.6 oz)    General appearance: alert, cooperative, appears stated age and no distress Resp: clear to auscultation bilaterally Cardio: regular rate and rhythm, S1, S2 normal, no murmur, click, rub or gallop GI: soft, non-tender; bowel sounds normal; no masses,  no organomegaly Extremities: extremities normal, atraumatic, no cyanosis or edema Neurologic: Awake and alert. Oriented 3. No focal neurological deficits.  Lab Results:  Data  Reviewed: I have personally reviewed following labs and imaging studies  CBC:  Recent Labs Lab 11/24/16 2122 11/25/16 0153 11/25/16 1009  WBC 7.1 7.7 PENDING  HGB 6.5* 6.0* 8.7*  HCT 22.4* 20.7* 27.4*  MCV 66.5* 66.1* 70.1*  PLT 357 307 [id number]    Basic Metabolic Panel:  Recent Labs Lab 11/24/16 2122  NA 139  K 3.7  CL 107  CO2 26  GLUCOSE 119*  BUN 7  CREATININE 0.89  CALCIUM 9.0    Coagulation Profile:  Recent Labs Lab 11/24/16 2327  INR 1.08      Radiology Studies: Ct Abdomen Pelvis W Contrast  Result Date: 11/25/2016 CLINICAL DATA:  Acute onset of hematuria.  Initial encounter. EXAM: CT ABDOMEN AND PELVIS WITH CONTRAST TECHNIQUE: Multidetector CT imaging of the abdomen and pelvis was performed using the standard protocol following bolus administration of intravenous contrast. CONTRAST:  100 mL ISOVUE-300 IOPAMIDOL (ISOVUE-300) INJECTION 61% COMPARISON:  None. FINDINGS: Lower chest: The visualized lung bases are grossly clear. The visualized portions of the mediastinum are unremarkable. Hepatobiliary: The liver is unremarkable in appearance. The gallbladder is unremarkable in appearance. The common bile duct remains normal in caliber. Pancreas: The pancreas is within normal limits. Spleen: The spleen is unremarkable in appearance. Adrenals/Urinary Tract: The adrenal glands are unremarkable in appearance.  There is minimal right-sided hydronephrosis, with dilatation of the right ureter. This appears to reflect a partially obstructing mass at the right side of the bladder, overlying the right vesicoureteral junction. A large left renal cyst is noted, measuring 5.3 cm in size. No nonobstructing renal stones are identified. No perinephric stranding is seen. Stomach/Bowel: The stomach is unremarkable in appearance. The small bowel is within normal limits. The appendix is normal in caliber, without evidence of appendicitis. The colon is unremarkable in appearance.  Vascular/Lymphatic: Scattered calcification is seen along the abdominal aorta and its branches. The abdominal aorta is otherwise grossly unremarkable. The inferior vena cava is grossly unremarkable. No retroperitoneal lymphadenopathy is seen. No pelvic sidewall lymphadenopathy is identified. Reproductive: There appears to be a lobulated mass at the right posterior aspect of the bladder, measuring 4.7 x 2.3 x 3.2 cm. The bladder is otherwise unremarkable in appearance. The uterus is grossly unremarkable. The ovaries are relatively symmetric. No suspicious adnexal masses are seen. Other: No additional soft tissue abnormalities are seen. Musculoskeletal: No acute osseous abnormalities are identified. Vacuum phenomenon is noted along the lumbar spine. The visualized musculature is unremarkable in appearance. IMPRESSION: 1. Apparent lobulated mass at the right posterior wall of the bladder, measuring 4.7 x 2.3 x 3.2 cm. This could reflect primary bladder malignancy, or possibly a large fungal ball. Cystoscopy and tissue diagnosis are recommended for further evaluation. 2. Minimal right-sided hydronephrosis, with dilatation of the right ureter. This appears to reflect the bladder mass, which overlies the right vesicoureteral junction. 3. Large left renal cyst noted. 4. Scattered aortic atherosclerosis. Electronically Signed   By: Garald Balding M.D.   On: 11/25/2016 01:40     Medications:  Scheduled: . [START ON 11/26/2016] amLODipine  10 mg Oral Daily   Continuous:  [id number]  Assessment/Plan:  Principal Problem:   Acute blood loss anemia Active Problems:   HTN (hypertension)   Gross hematuria    Acute blood loss anemia, symptomatic anemia. Patient has been transfused 2 units of blood. Follow-up on hemoglobin. Anemia secondary to hematuria.  Gross hematuria likely secondary to bladder tumor. CT scan shows a mass in her urinary bladder with mild right-sided hydronephrosis. Discussed with Dr.  Alyson Ingles with urology. Patient is not actively bleeding at this time. If her hemoglobin remains stable, this can be addressed as an outpatient. She will need to have a cystoscopy in urology office. UA reviewed. Large hemoglobin, is noted. There are some leukocytes. However, patient denies any dysuria. No fever. No clear indication for antibiotics at this time.  Accelerated hypertension. Blood pressure significantly elevated at the time of admission. She states that she has a known history of hypertension, but has not taken her medications in a long time. She was started on Norvasc. We will increase the dose to 10 mg. When necessary hydralazine. Blood pressure will likely take 2-3 weeks to reach optimal levels. She may also need additional medications.  DVT Prophylaxis: SCDs    Code Status: Full code  Family Communication: Discussed with the patient  Disposition Plan: Management as outlined above.    LOS: 0 days   [place] [facility] Pager [phone] 11/25/2016, 11:24 AM  If 7PM-7AM, please contact night-coverage at [email or web address], password [facility]

## 2016-11-25 NOTE — ED Notes (Signed)
Pt up ambulating to bathroom with minimal assistance. No distress observed.

## 2016-11-25 NOTE — H&P (Signed)
History and Physical    Faith [name] [id number] DOB: [birth year] DOA: 11/24/2016   PCP: No PCP Per Patient Chief Complaint:  Chief Complaint  Patient presents with  . Hematuria    HPI: Faith [name] is a 65 y.o. female with medical history significant of HTN, not taking meds for this for years.  Patient presents to the ED with c/o hematuria with blood clots.  Had onset 4 days ago and resolution 2 days ago of symptoms.  Has been having intermittent hematuria for past 6 months with increased urinary frequency without dysuria.  No vaginal bleeding.  Nothing makes symptoms better or worse.  ED Course: Microscopic hematuria on UA.  HGB 6.5, transfusion ordered.  Review of Systems: As per HPI otherwise 10 point review of systems negative.    Past Medical History:  Diagnosis Date  . Hypertension     History reviewed. No pertinent surgical history.   reports that she has never smoked. She has never used smokeless tobacco. She reports that she does not drink alcohol or use drugs.  No Known Allergies  Family History  Problem Relation Age of Onset  . Lung cancer Father       Prior to Admission medications   Medication Sig Start Date End Date Taking? Authorizing Provider  hydrochlorothiazide (HYDRODIURIL) 25 MG tablet Take 1 tablet (25 mg total) by mouth daily. Patient not taking: Reported on 11/24/2016 07/13/13   Serita Grit, MD    Physical Exam: Vitals:   11/24/16 2051 11/24/16 2236  BP: (!) 230/76 (!) 192/163  Pulse: 92 87  Resp: 18   Temp: 98.7 F (37.1 C)   TempSrc: Oral   SpO2: 100% 100%      Constitutional: NAD, calm, comfortable Eyes: PERRL, lids and conjunctivae normal ENMT: Mucous membranes are moist. Posterior pharynx clear of any exudate or lesions.Normal dentition.  Neck: normal, supple, no masses, no thyromegaly Respiratory: clear to auscultation bilaterally, no wheezing, no crackles. Normal respiratory effort. No accessory muscle use.    Cardiovascular: Regular rate and rhythm, no murmurs / rubs / gallops. No extremity edema. 2+ pedal pulses. No carotid bruits.  Abdomen: no tenderness, no masses palpated. No hepatosplenomegaly. Bowel sounds positive.  Musculoskeletal: no clubbing / cyanosis. No joint deformity upper and lower extremities. Good ROM, no contractures. Normal muscle tone.  Skin: no rashes, lesions, ulcers. No induration Neurologic: CN 2-12 grossly intact. Sensation intact, DTR normal. Strength 5/5 in all 4.  Psychiatric: Normal judgment and insight. Alert and oriented x 3. Normal mood.    Labs on Admission: I have personally reviewed following labs and imaging studies  CBC:  Recent Labs Lab 11/24/16 2122  WBC 7.1  HGB 6.5*  HCT 22.4*  MCV 66.5*  PLT [id number]   Basic Metabolic Panel:  Recent Labs Lab 11/24/16 2122  NA 139  K 3.7  CL 107  CO2 26  GLUCOSE 119*  BUN 7  CREATININE 0.89  CALCIUM 9.0   GFR: CrCl cannot be calculated (Unknown ideal weight.). Liver Function Tests: No results for input(s): AST, ALT, ALKPHOS, BILITOT, PROT, ALBUMIN in the last 168 hours. No results for input(s): LIPASE, AMYLASE in the last 168 hours. No results for input(s): AMMONIA in the last 168 hours. Coagulation Profile:  Recent Labs Lab 11/24/16 2327  INR 1.08   Cardiac Enzymes: No results for input(s): CKTOTAL, CKMB, CKMBINDEX, TROPONINI in the last 168 hours. BNP (last 3 results) No results for input(s): PROBNP in the last 8760 hours. HbA1C: No results  for input(s): HGBA1C in the last 72 hours. CBG: No results for input(s): GLUCAP in the last 168 hours. Lipid Profile: No results for input(s): CHOL, HDL, LDLCALC, TRIG, CHOLHDL, LDLDIRECT in the last 72 hours. Thyroid Function Tests: No results for input(s): TSH, T4TOTAL, FREET4, T3FREE, THYROIDAB in the last 72 hours. Anemia Panel: No results for input(s): VITAMINB12, FOLATE, FERRITIN, TIBC, IRON, RETICCTPCT in the last 72 hours. Urine analysis:     Component Value Date/Time   COLORURINE YELLOW 11/24/2016 2122   APPEARANCEUR CLOUDY (A) 11/24/2016 2122   LABSPEC 1.017 11/24/2016 2122   PHURINE 5.5 11/24/2016 2122   GLUCOSEU NEGATIVE 11/24/2016 2122   HGBUR LARGE (A) 11/24/2016 2122   BILIRUBINUR NEGATIVE 11/24/2016 2122   [place] NEGATIVE 11/24/2016 2122   PROTEINUR 100 (A) 11/24/2016 2122   NITRITE NEGATIVE 11/24/2016 2122   LEUKOCYTESUR SMALL (A) 11/24/2016 2122   Sepsis Labs: @LABRCNTIP (procalcitonin:4,lacticidven:4) )No results found for this or any previous visit (from the past 240 hour(s)).   Radiological Exams on Admission: No results found.  EKG: Independently reviewed.  Assessment/Plan Principal Problem:   Acute blood loss anemia Active Problems:   HTN (hypertension)   Gross hematuria    1. Acute blood loss anemia - due to hematuria 1. Transfuse 2. Repeat CBC in AM 2. Gross hematuria - with intermittent gross hematuria for past 6 months 1. Getting CT abd/pelvis 2. Highly concerning for neoplasm 3. Likely needs urology consult in AM based on results of CT. 3. HTN - 1. Starting norvasc 5mg  daily 2. PRN hydralazine ordered IV   DVT prophylaxis: SCDs Code Status: Full Family Communication: No family in room Consults called: None Admission status: Admit to obs   Miryah Ralls, [place] [facility] Pager [phone] from 7PM-7AM  If 7AM-7PM, please contact the day physician for the patient [email or web address] Password [email or web address]  11/25/2016, 1:04 AM

## 2016-11-26 DIAGNOSIS — D62 Acute posthemorrhagic anemia: Secondary | ICD-10-CM | POA: Diagnosis not present

## 2016-11-26 LAB — CBC
HEMATOCRIT: 27.9 % — AB (ref 36.0–46.0)
Hemoglobin: 8.6 g/dL — ABNORMAL LOW (ref 12.0–15.0)
MCH: 21.8 pg — AB (ref 26.0–34.0)
MCHC: 30.8 g/dL (ref 30.0–36.0)
MCV: 70.6 fL — AB (ref 78.0–100.0)
PLATELETS: 336 10*3/uL (ref 150–400)
RBC: 3.95 MIL/uL (ref 3.87–5.11)
RDW: 19.1 % — AB (ref 11.5–15.5)
WBC: 8.2 10*3/uL (ref 4.0–10.5)

## 2016-11-26 LAB — BASIC METABOLIC PANEL
Anion gap: 7 (ref 5–15)
BUN: 10 mg/dL (ref 6–20)
CO2: 26 mmol/L (ref 22–32)
Calcium: 9 mg/dL (ref 8.9–10.3)
Chloride: 106 mmol/L (ref 101–111)
Creatinine, Ser: 0.94 mg/dL (ref 0.44–1.00)
GFR calc Af Amer: >60 mL/min (ref >60)
GLUCOSE: 116 mg/dL — AB (ref 65–99)
POTASSIUM: 3.9 mmol/L (ref 3.5–5.1)
Sodium: 139 mmol/L (ref 135–145)

## 2016-11-26 LAB — TYPE AND SCREEN
ABO/RH(D): O POS
ANTIBODY SCREEN: NEGATIVE
UNIT DIVISION: 0
UNIT DIVISION: 0

## 2016-11-26 LAB — URINE CULTURE

## 2016-11-26 MED ORDER — METOPROLOL TARTRATE 25 MG PO TABS
25.0000 mg | ORAL_TABLET | Freq: Two times a day (BID) | ORAL | Status: DC
  Administered 2016-11-26: 25 mg via ORAL
  Filled 2016-11-26: qty 1

## 2016-11-26 MED ORDER — POLYETHYLENE GLYCOL 3350 17 G PO PACK
17.0000 g | PACK | Freq: Every day | ORAL | Status: DC
  Administered 2016-11-26: 17 g via ORAL
  Filled 2016-11-26: qty 1

## 2016-11-26 MED ORDER — AMLODIPINE BESYLATE 10 MG PO TABS: 10.0000 mg | ORAL_TABLET | Freq: Every day | ORAL | 1 refills | Status: DC

## 2016-11-26 MED ORDER — METOPROLOL TARTRATE 25 MG PO TABS: 25.0000 mg | ORAL_TABLET | Freq: Two times a day (BID) | ORAL | 0 refills | Status: DC

## 2016-11-26 MED ORDER — SENNOSIDES-DOCUSATE SODIUM 8.6-50 MG PO TABS: 1.0000 | ORAL_TABLET | Freq: Every day | ORAL | 2 refills | Status: DC

## 2016-11-26 MED ORDER — POLYETHYLENE GLYCOL 3350 17 G PO PACK: 17.0000 g | PACK | Freq: Every day | ORAL | 0 refills | Status: DC | PRN

## 2016-11-26 MED ORDER — FERROUS SULFATE 325 (65 FE) MG PO TABS: ORAL_TABLET | ORAL | 3 refills | Status: DC

## 2016-11-26 NOTE — Discharge Summary (Signed)
[facility]  Physician Discharge Summary   Patient ID: CATANA Faith [name] MRN: [id number] DOB/AGE: April 19, 1951 65 y.o.  Admit date: 11/24/2016 Discharge date: 11/26/2016  PCP: No PCP Per Patient  DISCHARGE DIAGNOSES:  Principal Problem:   Acute blood loss anemia Active Problems:   HTN (hypertension)   Gross hematuria   RECOMMENDATIONS FOR OUTPATIENT FOLLOW UP: 1. Patient has been made an appointment to see a urologist on December 5 for bladder tumor   DISCHARGE CONDITION: fair  Diet recommendation: Low sodium  Filed Weights   11/25/16 0531  Weight: 84.3 kg (185 lb 13.6 oz)    INITIAL HISTORY: 65 year old African-American female with a past medical history of hypertension for which she is not taking any medications at this time, who presented to the emergency department with complaints of hematuria. She also had complains of fatigue. Patient was found to be quite anemic. She was hospitalized for further management.  Consultations: Phone discussion with urology, Dr. Alyson Ingles  Procedures: None   HOSPITAL COURSE:   Acute blood loss anemia, symptomatic anemia. Patient has been transfused 2 units of blood. Hemoglobin has responded appropriately. Anemia was secondary to hematuria. Hemoglobin is stable this morning.   Gross hematuria likely secondary to bladder tumor. CT scan shows a mass in her urinary bladder with mild right-sided hydronephrosis. Discussed with Dr. Alyson Ingles with urology. Patient is not actively bleeding at this time. Outpatient follow-up is recommended as the patient was not actively bleeding. She has been made an appointment to see a urologist on December 5. She will need to have cystoscopy in the office. Patient denies any dysuria. She states that the urine is cleared up. No indication for antibiotics.   Accelerated hypertension. Blood pressure was significantly elevated at the time of admission. She states that she has a known history of  hypertension, but has not taken her medications in a long time. Her elevated blood pressure was likely due to noncompliance. Patient was started on Norvasc. Metoprolol has been added. She knows to follow-up with the primary care provider. Blood pressure will likely take 2-3 weeks to reach optimal levels.   Overall, stable and improved. All of patient's questions were answered. She requested that I speak to her brother chided. His questions were also answered. She has been emphasized importance of following up with a primary care provider and to keep her appointment with the urologist.   PERTINENT LABS:  The results of significant diagnostics from this hospitalization (including imaging, microbiology, ancillary and laboratory) are listed below for reference.    Microbiology: Recent Results (from the past 240 hour(s))  Urine culture     Status: Abnormal   Collection Time: 11/24/16 10:43 PM  Result Value Ref Range Status   Specimen Description URINE, CLEAN CATCH  Final   Special Requests NONE  Final   Culture MULTIPLE SPECIES PRESENT, SUGGEST RECOLLECTION (A)  Final   Report Status 11/26/2016 FINAL  Final     Labs: Basic Metabolic Panel:  Recent Labs Lab 11/24/16 2122 11/26/16 0259  NA 139 139  K 3.7 3.9  CL 107 106  CO2 26 26  GLUCOSE 119* 116*  BUN 7 10  CREATININE 0.89 0.94  CALCIUM 9.0 9.0   CBC:  Recent Labs Lab 11/24/16 2122 11/25/16 0153 11/25/16 1009 11/26/16 0259  WBC 7.1 7.7 8.5 8.2  HGB 6.5* 6.0* 8.7* 8.6*  HCT 22.4* 20.7* 27.4* 27.9*  MCV 66.5* 66.1* 70.1* 70.6*  PLT 357 307 323 336     IMAGING STUDIES Ct  Abdomen Pelvis W Contrast  Result Date: 11/25/2016 CLINICAL DATA:  Acute onset of hematuria.  Initial encounter. EXAM: CT ABDOMEN AND PELVIS WITH CONTRAST TECHNIQUE: Multidetector CT imaging of the abdomen and pelvis was performed using the standard protocol following bolus administration of intravenous contrast. CONTRAST:  100 mL ISOVUE-300 IOPAMIDOL  (ISOVUE-300) INJECTION 61% COMPARISON:  None. FINDINGS: Lower chest: The visualized lung bases are grossly clear. The visualized portions of the mediastinum are unremarkable. Hepatobiliary: The liver is unremarkable in appearance. The gallbladder is unremarkable in appearance. The common bile duct remains normal in caliber. Pancreas: The pancreas is within normal limits. Spleen: The spleen is unremarkable in appearance. Adrenals/Urinary Tract: The adrenal glands are unremarkable in appearance. There is minimal right-sided hydronephrosis, with dilatation of the right ureter. This appears to reflect a partially obstructing mass at the right side of the bladder, overlying the right vesicoureteral junction. A large left renal cyst is noted, measuring 5.3 cm in size. No nonobstructing renal stones are identified. No perinephric stranding is seen. Stomach/Bowel: The stomach is unremarkable in appearance. The small bowel is within normal limits. The appendix is normal in caliber, without evidence of appendicitis. The colon is unremarkable in appearance. Vascular/Lymphatic: Scattered calcification is seen along the abdominal aorta and its branches. The abdominal aorta is otherwise grossly unremarkable. The inferior vena cava is grossly unremarkable. No retroperitoneal lymphadenopathy is seen. No pelvic sidewall lymphadenopathy is identified. Reproductive: There appears to be a lobulated mass at the right posterior aspect of the bladder, measuring 4.7 x 2.3 x 3.2 cm. The bladder is otherwise unremarkable in appearance. The uterus is grossly unremarkable. The ovaries are relatively symmetric. No suspicious adnexal masses are seen. Other: No additional soft tissue abnormalities are seen. Musculoskeletal: No acute osseous abnormalities are identified. Vacuum phenomenon is noted along the lumbar spine. The visualized musculature is unremarkable in appearance. IMPRESSION: 1. Apparent lobulated mass at the right posterior wall of  the bladder, measuring 4.7 x 2.3 x 3.2 cm. This could reflect primary bladder malignancy, or possibly a large fungal ball. Cystoscopy and tissue diagnosis are recommended for further evaluation. 2. Minimal right-sided hydronephrosis, with dilatation of the right ureter. This appears to reflect the bladder mass, which overlies the right vesicoureteral junction. 3. Large left renal cyst noted. 4. Scattered aortic atherosclerosis. Electronically Signed   By: Garald Balding M.D.   On: 11/25/2016 01:40    DISCHARGE EXAMINATION: Vitals:   11/25/16 2058 11/26/16 0443 11/26/16 0924 11/26/16 1218  BP: (!) 199/74 (!) 182/80 (!) 185/75 (!) 170/70  Pulse: 89 69 72   Resp: (!) 21 20    Temp: 99.3 F (37.4 C) 98.7 F (37.1 C)    TempSrc: Oral Oral    SpO2: 98% 100%    Weight:      Height:       General appearance: alert, cooperative, appears stated age and no distress Resp: clear to auscultation bilaterally Cardio: regular rate and rhythm, S1, S2 normal, no murmur, click, rub or gallop GI: soft, non-tender; bowel sounds normal; no masses,  no organomegaly Extremities: extremities normal, atraumatic, no cyanosis or edema Neurologic: Alert and oriented X 3, normal strength and tone. Normal symmetric reflexes. Normal coordination and gait  DISPOSITION: Home  Discharge Instructions    Call MD for:  difficulty breathing, headache or visual disturbances    Complete by:  As directed    Call MD for:  extreme fatigue    Complete by:  As directed    Call MD for:  persistant dizziness or light-headedness    Complete by:  As directed    Call MD for:  persistant nausea and vomiting    Complete by:  As directed    Call MD for:  severe uncontrolled pain    Complete by:  As directed    Call MD for:  temperature >100.4    Complete by:  As directed    Diet - low sodium heart healthy    Complete by:  As directed    Discharge instructions    Complete by:  As directed    Please be sure to establish with a  primary care provider for further management of high blood pressure and for further evaluation of high glucose levels. Please be sure to keep your appointment with the urologist on December 5. Taking medications as prescribed. Seek attention if bleeding recurs.  You were cared for by a hospitalist during your hospital stay. If you have any questions about your discharge medications or the care you received while you were in the hospital after you are discharged, you can call the unit and asked to speak with the hospitalist on call if the hospitalist that took care of you is not available. Once you are discharged, your primary care physician will handle any further medical issues. Please note that NO REFILLS for any discharge medications will be authorized once you are discharged, as it is imperative that you return to your primary care physician (or establish a relationship with a primary care physician if you do not have one) for your aftercare needs so that they can reassess your need for medications and monitor your lab values. If you do not have a primary care physician, you can call [phone] for a physician referral.   Increase activity slowly    Complete by:  As directed       ALLERGIES: No Known Allergies   Discharge Medication List as of 11/26/2016 11:36 AM    START taking these medications   Details  amLODipine (NORVASC) 10 MG tablet Take 1 tablet (10 mg total) by mouth daily., Starting Fri 11/26/2016, Print    ferrous sulfate 325 (65 FE) MG tablet Start taking only after constipation is relieved. Take 1 tablet once daily for 1 week, then 1 tablet twice daily for 1 week and then 1 tablet three times daily., Print    metoprolol tartrate (LOPRESSOR) 25 MG tablet Take 1 tablet (25 mg total) by mouth 2 (two) times daily., Starting Fri 11/26/2016, Print    polyethylene glycol (MIRALAX / GLYCOLAX) packet Take 17 g by mouth daily as needed., Starting Fri 11/26/2016, Print    senna-docusate  (SENOKOT-S) 8.6-50 MG tablet Take 1 tablet by mouth daily., Starting Fri 11/26/2016, Print      STOP taking these medications     hydrochlorothiazide (HYDRODIURIL) 25 MG tablet          Follow-up Information    [facility]. Go on 11/30/2016.   Why:  Appt @ 10 am bring insurance card and copay arrive 15 min early w/ Dr. Jarvis Morgan information: [place] [phone]          TOTAL DISCHARGE TIME: 46 minutes  [facility] Pager [phone]  11/26/2016, 2:22 PM

## 2016-11-26 NOTE — Discharge Instructions (Signed)

## 2016-11-26 NOTE — Progress Notes (Signed)
Pt given note for work, ride here at this time, pt discharged via wheelchair with belongings, escorted by hospital volunteer.

## 2016-11-26 NOTE — Progress Notes (Signed)
D/c instructions reviewed with pt. Copy of instructions and scripts given to pt. MD had discussed with pt to return to work in 1 week, pt needs note for her work. MD text messaged, waiting response. Pt waiting for ride to arrive.

## 2016-12-07 ENCOUNTER — Other Ambulatory Visit: Payer: Self-pay | Admitting: Urology

## 2016-12-22 ENCOUNTER — Encounter (HOSPITAL_BASED_OUTPATIENT_CLINIC_OR_DEPARTMENT_OTHER): Payer: Self-pay | Admitting: *Deleted

## 2016-12-22 NOTE — Progress Notes (Signed)
NPO AFTER MN.  ARRIVE AT 0745.  NEEDS ISTAT AND EKG.  WILL TAKE METOPROLOL AND NORVASC AM DOS W/ SIPS OF WATER.

## 2016-12-24 NOTE — H&P (Signed)
Office Visit Report     11/30/2016   --------------------------------------------------------------------------------   [place]  MRN: [id number]  PRIMARY CARE:    DOB: 1951-02-24, 65 year old Female  REFERRING:    SSN: [id number]  PROVIDER:  Festus Aloe, M.D.    LOCATION:  [facility] - [phone]   --------------------------------------------------------------------------------   CC: I have blood in my urine.  HPI: Faith [name] is a 65 year-old female patient who is here for blood in the urine.  She did see the blood in her urine. She first noticed the symptoms 11/20/2016. She has seen blood clots.   She does not have a burning sensation when she urinates. She is not currently having trouble urinating.   She is not having pain.   The patient developed gross hematuria with a hemoglobin of 6. Her BUN was 7 and creatinine 0.89. She was admitted to hospital, transfused and discharged with a hemoglobin of 8.6 and hematocrit of 27.9. Urine culture grew mixed, multiple species. Her hematuria had cleared. She underwent a CT scan of the abdomen and pelvis which revealed a 4.7 x 3.2 bladder neoplasm situated over the right ureteral orifice causing mild right hydroureteronephrosis.   Her urine is clear. No dysuria. No flank pain. She has frequency and urgency, specially turn key. She has UUI and has had some accidents.   She has no exposure risk.     ALLERGIES: None   MEDICATIONS: Metoprolol Tartrate 25 mg tablet  Amlodipine Besylate 10 mg tablet  Ferrous Sulfate 325 mg (65 mg iron) tablet  Polyethylene Glycol 3350 17 gram powder in packet  Senna     GU PSH: None   NON-GU PSH: None   GU PMH: None   NON-GU PMH: Hypertension    FAMILY HISTORY: None   SOCIAL HISTORY: Marital Status: Widowed Does not drink caffeine. 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    REVIEW OF SYSTEMS:    GU Review Female:   Patient reports frequent urination, hard to  postpone urination, and get up at night to urinate. Patient denies burning /pain with urination, leakage of urine, stream starts and stops, trouble starting your stream, have to strain to urinate, and currently pregnant.  Gastrointestinal (Upper):   Patient denies nausea, vomiting, and indigestion/ heartburn.  Gastrointestinal (Lower):   Patient denies diarrhea and constipation.  Constitutional:   Patient reports fatigue. Patient denies night sweats, weight loss, and fever.  Skin:   Patient denies skin rash/ lesion and itching.  Eyes:   Patient denies blurred vision and double vision.  Ears/ Nose/ Throat:   Patient denies sore throat and sinus problems.  Hematologic/Lymphatic:   Patient denies swollen glands and easy bruising.  Cardiovascular:   Patient denies leg swelling and chest pains.  Respiratory:   Patient denies cough and shortness of breath.  Endocrine:   Patient denies excessive thirst.  Musculoskeletal:   Patient denies back pain and joint pain.  Neurological:   Patient reports headaches. Patient denies dizziness.  Psychologic:   Patient denies depression and anxiety.   VITAL SIGNS:      11/30/2016 10:57 AM  Weight 180 lb / 81.65 kg  Height 61 in / 154.94 cm  BP 180/61 mmHg  Pulse 66 /min  Temperature 98.5 F / 37 C  BMI 34.0 kg/m   GU PHYSICAL EXAMINATION:    External Genitalia: No hirsutism, no rash, no scarring, no cyst, no erythematous lesion, no papular lesion, no blanched lesion, no warty lesion. No edema.  Urethral Meatus: Normal size. Normal position. No discharge.  Urethra: No tenderness, no mass, no scarring. No hypermobility. No leakage.   MULTI-SYSTEM PHYSICAL EXAMINATION:    Constitutional: Well-nourished. No physical deformities. Normally developed. Good grooming.  Neck: Neck symmetrical, not swollen. Normal tracheal position.  Respiratory: No labored breathing, no use of accessory muscles.   Cardiovascular: Normal temperature, normal extremity pulses, no  swelling, no varicosities.  Lymphatic: No enlargement of neck, axillae, groin.  Skin: No paleness, no jaundice, no cyanosis. No lesion, no ulcer, no rash.  Neurologic / Psychiatric: Oriented to time, oriented to place, oriented to person. No depression, no anxiety, no agitation.  Gastrointestinal: No mass, no tenderness, no rigidity, non obese abdomen.  Eyes: Normal conjunctivae. Normal eyelids.  Ears, Nose, Mouth, and Throat: Left ear no scars, no lesions, no masses. Right ear no scars, no lesions, no masses. Nose no scars, no lesions, no masses. Normal hearing. Normal lips.  Musculoskeletal: Normal gait and station of head and neck.     PAST DATA REVIEWED:  Source Of History:  Patient   PROCEDURES:         Flexible Cystoscopy - 52000  Risks, benefits, and some of the potential complications of the procedure were discussed at length with the patient including infection, bleeding, voiding discomfort, urinary retention, fever, chills, sepsis, and others. All questions were answered. Informed consent was obtained. Antibiotic prophylaxis was given. Sterile technique and intraurethral analgesia were used. Chaperone - ERica for exam and cysto.   Meatus:  Normal size. Normal location. Normal condition.  Urethra:  No hypermobility. No leakage.  Ureteral Orifices:  Normal location. Normal size. Normal shape. Effluxed clear urine.  Bladder:  A trigone tumor - right side over right UO. No trabeculation. Normal mucosa. No stones.      The lower urinary tract was carefully examined. The procedure was well-tolerated and without complications. Antibiotic instructions were given. Instructions were given to call the office immediately for bloody urine, difficulty urinating, urinary retention, painful or frequent urination, fever, chills, nausea, vomiting or other illness. The patient stated that she understood these instructions and would comply with them.         Urinalysis w/Scope Dipstick Dipstick  Cont'd Micro  Color: Yellow Bilirubin: Neg WBC/hpf: >60/hpf  Appearance: Cloudy Ketones: Neg RBC/hpf: 40 - 60/hpf  Specific Gravity: 1.020 Blood: 3+ Bacteria: Few (10-25/hpf)  pH: 5.5 Protein: 3+ Cystals: NS (Not Seen)  Glucose: Neg Urobilinogen: 0.2 Casts: NS (Not Seen)    Nitrites: Neg Trichomonas: Not Present    Leukocyte Esterase: Trace Mucous: Present      Epithelial Cells: 0 - 5/hpf      Yeast: NS (Not Seen)      Sperm: Not Present    ASSESSMENT:      ICD-10 Details  1 GU:   Gross hematuria - R31.0   2   Bladder, Neoplasm of uncertain behavior - A999333    PLAN:           Orders Labs Urine Culture and Sensitivity          Schedule Return Visit: Next Available Appointment - Office Visit, Schedule Surgery          Document Letter(s):  Created for Patient: Clinical Summary         Notes:   Bladder neoplasm - I discussed the nature of risks benefits and alternatives to TURBT with postoperative mitomycin C. We also discussed she will likely need a right ureteral stent. All questions answered. She elects  to proceed. Discussed possible need for foley, restaging in a few weeks, etc.       * Signed by Festus Aloe, M.D. on 12/01/16 at 7:51 AM (EST)*     The information contained in this medical record document is considered private and confidential patient information. This information can only be used for the medical diagnosis and/or medical services that are being provided by the patient's selected caregivers. This information can only be distributed outside of the patient's care if the patient agrees and signs waivers of authorization for this information to be sent to an outside source or route.

## 2016-12-28 ENCOUNTER — Ambulatory Visit (HOSPITAL_BASED_OUTPATIENT_CLINIC_OR_DEPARTMENT_OTHER): Payer: BLUE CROSS/BLUE SHIELD | Admitting: Anesthesiology

## 2016-12-28 ENCOUNTER — Ambulatory Visit (HOSPITAL_BASED_OUTPATIENT_CLINIC_OR_DEPARTMENT_OTHER)
Admission: RE | Admit: 2016-12-28 | Discharge: 2016-12-28 | Disposition: A | Discharge status: Home / Self Care | Payer: BLUE CROSS/BLUE SHIELD | Source: Ambulatory Visit | Attending: Urology | Admitting: Urology

## 2016-12-28 ENCOUNTER — Encounter (HOSPITAL_BASED_OUTPATIENT_CLINIC_OR_DEPARTMENT_OTHER)
Admission: RE | Disposition: A | Discharge status: Home / Self Care | Payer: Self-pay | Source: Ambulatory Visit | Attending: Urology

## 2016-12-28 ENCOUNTER — Encounter (HOSPITAL_BASED_OUTPATIENT_CLINIC_OR_DEPARTMENT_OTHER): Payer: Self-pay | Admitting: Anesthesiology

## 2016-12-28 DIAGNOSIS — D494 Neoplasm of unspecified behavior of bladder: Secondary | ICD-10-CM | POA: Insufficient documentation

## 2016-12-28 DIAGNOSIS — Z79899 Other long term (current) drug therapy: Secondary | ICD-10-CM | POA: Insufficient documentation

## 2016-12-28 DIAGNOSIS — I10 Essential (primary) hypertension: Secondary | ICD-10-CM | POA: Diagnosis not present

## 2016-12-28 DIAGNOSIS — N8110 Cystocele, unspecified: Secondary | ICD-10-CM | POA: Insufficient documentation

## 2016-12-28 DIAGNOSIS — Z6834 Body mass index (BMI) 34.0-34.9, adult: Secondary | ICD-10-CM | POA: Insufficient documentation

## 2016-12-28 DIAGNOSIS — R310 Gross hematuria: Secondary | ICD-10-CM | POA: Diagnosis not present

## 2016-12-28 DIAGNOSIS — Z8551 Personal history of malignant neoplasm of bladder: Secondary | ICD-10-CM

## 2016-12-28 DIAGNOSIS — N1330 Unspecified hydronephrosis: Secondary | ICD-10-CM | POA: Insufficient documentation

## 2016-12-28 HISTORY — DX: Gross hematuria: R31.0

## 2016-12-28 HISTORY — DX: Urgency of urination: R39.15

## 2016-12-28 HISTORY — PX: CYSTOSCOPY W/ URETERAL STENT PLACEMENT: SHX1429

## 2016-12-28 HISTORY — PX: TRANSURETHRAL RESECTION OF BLADDER TUMOR WITH MITOMYCIN-C: SHX6459

## 2016-12-28 HISTORY — DX: Neoplasm of unspecified behavior of bladder: D49.4

## 2016-12-28 HISTORY — DX: Acute posthemorrhagic anemia: D62

## 2016-12-28 HISTORY — DX: Constipation, unspecified: K59.00

## 2016-12-28 LAB — POCT I-STAT, CHEM 8
BUN: 28 mg/dL — AB (ref 6–20)
CHLORIDE: 104 mmol/L (ref 101–111)
Calcium, Ion: 1.1 mmol/L — ABNORMAL LOW (ref 1.15–1.40)
Creatinine, Ser: 0.9 mg/dL (ref 0.44–1.00)
GLUCOSE: 138 mg/dL — AB (ref 65–99)
HCT: 36 % (ref 36.0–46.0)
Hemoglobin: 12.2 g/dL (ref 12.0–15.0)
POTASSIUM: 6.1 mmol/L — AB (ref 3.5–5.1)
Sodium: 135 mmol/L (ref 135–145)
TCO2: 28 mmol/L (ref 0–100)

## 2016-12-28 LAB — POCT I-STAT 4, (NA,K, GLUC, HGB,HCT)
GLUCOSE: 140 mg/dL — AB (ref 65–99)
Glucose, Bld: 157 mg/dL — ABNORMAL HIGH (ref 65–99)
HCT: 36 % (ref 36.0–46.0)
HEMATOCRIT: 34 % — AB (ref 36.0–46.0)
HEMOGLOBIN: 12.2 g/dL (ref 12.0–15.0)
HEMOGLOBIN: 11.6 g/dL — AB (ref 12.0–15.0)
POTASSIUM: 4.1 mmol/L (ref 3.5–5.1)
Potassium: 6.2 mmol/L — ABNORMAL HIGH (ref 3.5–5.1)
SODIUM: 140 mmol/L (ref 135–145)
Sodium: 138 mmol/L (ref 135–145)

## 2016-12-28 SURGERY — TRANSURETHRAL RESECTION OF BLADDER TUMOR WITH MITOMYCIN-C
Anesthesia: General | Laterality: Right

## 2016-12-28 MED ORDER — BELLADONNA ALKALOIDS-OPIUM 16.2-60 MG RE SUPP
RECTAL | Status: AC
  Filled 2016-12-28: qty 1

## 2016-12-28 MED ORDER — MIDAZOLAM HCL 5 MG/5ML IJ SOLN
INTRAMUSCULAR | Status: DC | PRN
  Administered 2016-12-28: 2 mg via INTRAVENOUS

## 2016-12-28 MED ORDER — ONDANSETRON HCL 4 MG/2ML IJ SOLN
INTRAMUSCULAR | Status: DC | PRN
  Administered 2016-12-28: 4 mg via INTRAVENOUS

## 2016-12-28 MED ORDER — ONDANSETRON HCL 4 MG/2ML IJ SOLN
INTRAMUSCULAR | Status: AC
  Filled 2016-12-28: qty 2

## 2016-12-28 MED ORDER — HYDROMORPHONE HCL 1 MG/ML IJ SOLN
0.2500 mg | INTRAMUSCULAR | Status: DC | PRN
  Filled 2016-12-28: qty 0.5

## 2016-12-28 MED ORDER — GLYCOPYRROLATE 0.2 MG/ML IJ SOLN
INTRAMUSCULAR | Status: DC | PRN
  Administered 2016-12-28: 0.2 mg via INTRAVENOUS

## 2016-12-28 MED ORDER — LIDOCAINE HCL (CARDIAC) 20 MG/ML IV SOLN
INTRAVENOUS | Status: DC | PRN
  Administered 2016-12-28: 100 mg via INTRAVENOUS

## 2016-12-28 MED ORDER — CEFAZOLIN SODIUM-DEXTROSE 2-4 GM/100ML-% IV SOLN
2.0000 g | INTRAVENOUS | Status: AC
  Administered 2016-12-28: 2 g via INTRAVENOUS
  Filled 2016-12-28: qty 100

## 2016-12-28 MED ORDER — SCOPOLAMINE 1 MG/3DAYS TD PT72
1.0000 | MEDICATED_PATCH | TRANSDERMAL | Status: DC
  Administered 2016-12-28: 1.5 mg via TRANSDERMAL
  Filled 2016-12-28: qty 1

## 2016-12-28 MED ORDER — MIDAZOLAM HCL 2 MG/2ML IJ SOLN
INTRAMUSCULAR | Status: AC
  Filled 2016-12-28: qty 2

## 2016-12-28 MED ORDER — METOCLOPRAMIDE HCL 5 MG/ML IJ SOLN
INTRAMUSCULAR | Status: DC | PRN
  Administered 2016-12-28: 10 mg via INTRAVENOUS

## 2016-12-28 MED ORDER — LACTATED RINGERS IV SOLN
INTRAVENOUS | Status: DC
  Administered 2016-12-28 (×2): via INTRAVENOUS
  Filled 2016-12-28: qty 1000

## 2016-12-28 MED ORDER — PROPOFOL 10 MG/ML IV BOLUS
INTRAVENOUS | Status: DC | PRN
  Administered 2016-12-28: 50 mg via INTRAVENOUS
  Administered 2016-12-28: 20 mg via INTRAVENOUS
  Administered 2016-12-28: 180 mg via INTRAVENOUS

## 2016-12-28 MED ORDER — DEXAMETHASONE SODIUM PHOSPHATE 4 MG/ML IJ SOLN
INTRAMUSCULAR | Status: DC | PRN
  Administered 2016-12-28: 10 mg via INTRAVENOUS

## 2016-12-28 MED ORDER — LIDOCAINE 2% (20 MG/ML) 5 ML SYRINGE
INTRAMUSCULAR | Status: AC
  Filled 2016-12-28: qty 5

## 2016-12-28 MED ORDER — FENTANYL CITRATE (PF) 100 MCG/2ML IJ SOLN
INTRAMUSCULAR | Status: AC
  Filled 2016-12-28: qty 2

## 2016-12-28 MED ORDER — CEFAZOLIN SODIUM-DEXTROSE 2-4 GM/100ML-% IV SOLN
INTRAVENOUS | Status: AC
  Filled 2016-12-28: qty 100

## 2016-12-28 MED ORDER — SODIUM CHLORIDE 0.9 % IR SOLN
Status: DC | PRN
  Administered 2016-12-28 (×8): 3000 mL via INTRAVESICAL

## 2016-12-28 MED ORDER — DEXAMETHASONE SODIUM PHOSPHATE 10 MG/ML IJ SOLN
INTRAMUSCULAR | Status: AC
  Filled 2016-12-28: qty 1

## 2016-12-28 MED ORDER — PROPOFOL 10 MG/ML IV BOLUS
INTRAVENOUS | Status: AC
  Filled 2016-12-28: qty 40

## 2016-12-28 MED ORDER — TRAMADOL HCL 50 MG PO TABS: 50.0000 mg | ORAL_TABLET | Freq: Four times a day (QID) | ORAL | 0 refills | Status: DC | PRN

## 2016-12-28 MED ORDER — IOHEXOL 300 MG/ML  SOLN
INTRAMUSCULAR | Status: DC | PRN
  Administered 2016-12-28: 6 mL via INTRAVENOUS

## 2016-12-28 MED ORDER — PROMETHAZINE HCL 25 MG/ML IJ SOLN
6.2500 mg | INTRAMUSCULAR | Status: DC | PRN
  Filled 2016-12-28: qty 1

## 2016-12-28 MED ORDER — FENTANYL CITRATE (PF) 100 MCG/2ML IJ SOLN
INTRAMUSCULAR | Status: DC | PRN
  Administered 2016-12-28: 12.5 ug via INTRAVENOUS
  Administered 2016-12-28: 25 ug via INTRAVENOUS
  Administered 2016-12-28 (×2): 12.5 ug via INTRAVENOUS
  Administered 2016-12-28: 25 ug via INTRAVENOUS
  Administered 2016-12-28 (×2): 12.5 ug via INTRAVENOUS
  Administered 2016-12-28 (×3): 25 ug via INTRAVENOUS
  Administered 2016-12-28: 12.5 ug via INTRAVENOUS

## 2016-12-28 MED ORDER — SCOPOLAMINE 1 MG/3DAYS TD PT72
MEDICATED_PATCH | TRANSDERMAL | Status: AC
  Filled 2016-12-28: qty 1

## 2016-12-28 MED ORDER — LIDOCAINE HCL 2 % EX GEL
CUTANEOUS | Status: AC
  Filled 2016-12-28: qty 5

## 2016-12-28 MED ORDER — LEVOFLOXACIN 500 MG PO TABS: 500.0000 mg | ORAL_TABLET | Freq: Every day | ORAL | 0 refills | Status: DC

## 2016-12-28 MED ORDER — METOCLOPRAMIDE HCL 5 MG/ML IJ SOLN
INTRAMUSCULAR | Status: AC
  Filled 2016-12-28: qty 2

## 2016-12-28 SURGICAL SUPPLY — 27 items
BAG DRAIN URO-CYSTO SKYTR STRL (DRAIN) ×3 IMPLANT
BENZOIN TINCTURE PRP APPL 2/3 (GAUZE/BANDAGES/DRESSINGS) IMPLANT
CATH COUDE FOLEY 2W 5CC 18FR (CATHETERS) ×3 IMPLANT
CATH INTERMIT  6FR 70CM (CATHETERS) ×3 IMPLANT
CATH URET 5FR 28IN CONE TIP (BALLOONS)
CATH URET 5FR 28IN OPEN ENDED (CATHETERS) IMPLANT
CATH URET 5FR 70CM CONE TIP (BALLOONS) IMPLANT
CLOTH BEACON ORANGE TIMEOUT ST (SAFETY) ×3 IMPLANT
GLOVE BIO SURGEON STRL SZ7.5 (GLOVE) ×3 IMPLANT
GOWN STRL REUS W/ TWL XL LVL3 (GOWN DISPOSABLE) ×2 IMPLANT
GOWN STRL REUS W/TWL XL LVL3 (GOWN DISPOSABLE) ×1
GUIDEWIRE 0.038 PTFE COATED (WIRE) IMPLANT
GUIDEWIRE ANG ZIPWIRE 038X150 (WIRE) IMPLANT
GUIDEWIRE STR DUAL SENSOR (WIRE) ×3 IMPLANT
KIT BALLIN UROMAX 15FX10 (LABEL) IMPLANT
KIT BALLN UROMAX 15FX4 (MISCELLANEOUS) IMPLANT
KIT BALLN UROMAX 26 75X4 (MISCELLANEOUS)
KIT ROOM TURNOVER WOR (KITS) ×3 IMPLANT
LOOP CUT BIPOLAR 24F LRG (ELECTROSURGICAL) ×3 IMPLANT
MANIFOLD NEPTUNE II (INSTRUMENTS) ×3 IMPLANT
NS IRRIG 500ML POUR BTL (IV SOLUTION) IMPLANT
PACK CYSTO (CUSTOM PROCEDURE TRAY) ×3 IMPLANT
SET HIGH PRES BAL DIL (LABEL)
STENT URET 6FRX24 CONTOUR (STENTS) ×3 IMPLANT
SYR 10ML LL (SYRINGE) ×3 IMPLANT
TRAY FOLEY BAG SILVER LF 16FR (SET/KITS/TRAYS/PACK) ×3 IMPLANT
TUBE CONNECTING 12X1/4 (SUCTIONS) ×3 IMPLANT

## 2016-12-28 NOTE — Anesthesia Postprocedure Evaluation (Addendum)
Anesthesia Post Note  Patient: Faith [name]  Procedure(s) Performed: Procedure(s) (LRB): TRANSURETHRAL RESECTION OF BLADDER TUMOR GREATER THAN 5 CM (N/A) CYSTOSCOPY WITH RIGHT RETROGRADE URETERAL STENT PLACEMENT (Right)  Patient location during evaluation: PACU Anesthesia Type: General Level of consciousness: sedated Pain management: pain level controlled Vital Signs Assessment: post-procedure vital signs reviewed and stable Respiratory status: spontaneous breathing and respiratory function stable Cardiovascular status: stable Anesthetic complications: no       Last Vitals:  Vitals:   12/28/16 1245 12/28/16 1400  BP: (!) 152/70 (!) 138/55  Pulse: 77 72  Resp: 18 16  Temp:  36.7 C    Last Pain:  Vitals:   12/28/16 1245  TempSrc:   PainSc: 0-No pain                 Jaymarion Trombly DANIEL

## 2016-12-28 NOTE — Transfer of Care (Signed)
Immediate Anesthesia Transfer of Care Note  Patient: Faith [name]  Procedure(s) Performed: Procedure(s) (LRB): TRANSURETHRAL RESECTION OF BLADDER TUMOR GREATER THAN 5 CM (N/A) CYSTOSCOPY WITH RIGHT RETROGRADE URETERAL STENT PLACEMENT (Right)  Patient Location: PACU  Anesthesia Type: General  Level of Consciousness: awake, sedated, patient cooperative and responds to stimulation  Airway & Oxygen Therapy: Patient Spontanous Breathing and Patient connected to face mask oxygen  Post-op Assessment: Report given to PACU RN, Post -op Vital signs reviewed and stable and Patient moving all extremities  Post vital signs: Reviewed and stable  Complications: No apparent anesthesia complications

## 2016-12-28 NOTE — Interval H&P Note (Signed)
History and Physical Interval Note:  12/28/2016 8:46 AM  Faith [name]  has presented today for surgery, with the diagnosis of BLADDER NEOPLASM  The various methods of treatment have been discussed with the patient and family. After consideration of risks, benefits and other options for treatment, the patient has consented to  Procedure(s): TRANSURETHRAL RESECTION OF BLADDER TUMOR WITH EPIRUBICIN (N/A) CYSTOSCOPY WITH RIGHT RETROGRADE URETERAL STENT PLACEMENT (Right) as a surgical intervention .  The patient's history has been reviewed, patient examined, no change in status, stable for surgery. She's had a sore throat and stuffy nose for a few days, but no fever. She's had no dysuria. She had gross hematuria yesterday. Her K is elevated and we are rechecking labs. I have reviewed the patient's chart and labs.  Questions were answered to the patient's satisfaction.  Discussed again need for foley, stent, possible staged procedure.    Faith [name]

## 2016-12-28 NOTE — Anesthesia Preprocedure Evaluation (Addendum)
Anesthesia Evaluation  Patient identified by MRN, date of birth, ID band Patient awake    Reviewed: Allergy & Precautions, NPO status , Patient's Chart, lab work & pertinent test results, reviewed documented beta blocker date and time   Airway Mallampati: II  TM Distance: >3 FB Neck ROM: Full    Dental  (+) Dental Advisory Given, Poor Dentition   Pulmonary neg pulmonary ROS,    Pulmonary exam normal        Cardiovascular hypertension, Pt. on medications and Pt. on home beta blockers negative cardio ROS Normal cardiovascular exam     Neuro/Psych negative neurological ROS  negative psych ROS   GI/Hepatic negative GI ROS, Neg liver ROS,   Endo/Other  Morbid obesity  Renal/GU   negative genitourinary   Musculoskeletal negative musculoskeletal ROS (+)   Abdominal   Peds negative pediatric ROS (+)  Hematology negative hematology ROS (+)   Anesthesia Other Findings   Reproductive/Obstetrics negative OB ROS                           Anesthesia Physical Anesthesia Plan  ASA: III  Anesthesia Plan: General   Post-op Pain Management:    Induction: Intravenous  Airway Management Planned: LMA  Additional Equipment:   Intra-op Plan:   Post-operative Plan: Extubation in OR  Informed Consent: I have reviewed the patients History and Physical, chart, labs and discussed the procedure including the risks, benefits and alternatives for the proposed anesthesia with the patient or authorized representative who has indicated his/her understanding and acceptance.   Dental advisory given  Plan Discussed with: CRNA and Anesthesiologist  Anesthesia Plan Comments:         Anesthesia Quick Evaluation

## 2016-12-28 NOTE — Op Note (Addendum)
Preoperative diagnosis: Right bladder neoplasm, right hydronephrosis, gross hematuria Postoperative diagnosis: Same  Procedure: TURBT greater than 5 cm, right retrograde pyelogram, right ureteral stent placement  Surgeon: Junious Silk  Anesthesia: Gen.  Indication for procedure: Patient is a 66 year old African-American female with recurrent gross hematuria and bladder neoplasm on CT. There was mild right hydroureteronephrosis to the tumor; the ureteral orifice appeared dilated on the CT. Her K was a bit high and I spoke to anesthesia about it. Given no CV symptoms and EKG tracing appeared normal we proceeded. Will recheck in PACU.   Findings: On exam under anesthesia the bladder was palpably normal both on palpation of the anterior vaginal wall and bimanual exam. She had a stage II cystocele. The full but appeared normal without mass or lesion. The introitus was mildly enlarged with a stage I rectocele.  On cystoscopy the urethra was tight but may been some of the angulation because of the cystocele. There was a large papillary broad-based tumor progressing from the trigone area around the right ureteral orifice to the right posterior. The right bladder neck appeared edematous with some erythema and was biopsied. The remainder the mucosa appeared normal. The left ureteral orifice appeared normal with clear efflux. The right ureteral orifice was not initially located. After was located it was wide and patulous and did not appear to be directly involved but it was obstructed.  Right retrograde pyelogram-this outlined a single ureter single collecting system unit without filling defect or stricture, initially there was some mild dilation but the system drained quickly and follow-up imaging revealed a good coil in the renal pelvis and no dilation.  Description of procedure: After consent was obtained the patient was brought to the operating room. After adequate anesthesia she was placed in lithotomy  position and prepped and draped in the usual sterile fashion. A timeout was performed to confirm the patient and procedure. I did an exam under anesthesia. The cystoscope was passed per urethra and the bladder carefully inspected. The resectoscope sheath, continuous-flow, was passed with the visual obturator but it would not readily go. Therefore I dilated the ureteral orifice to 35 [place] with the female sounds. The continuous-flow sheath was then passed and then the loop and handle were connected. I resected the tumor from right to left down to its base and sent this as right bladder tumor. The tumor was solid especially in the middle but did seem to come off the muscle. The base was resected and I was able to locate the right ureteral orifice which was patulous and readily visible. I sent the bladder tumor base separately.   Some initial hemostasis was obtained and then I put the bridge on and was able to advance a sensor wire without difficulty. A 6 French open-ended catheter was advanced into the right proximal ureter and the wire removed. Urine drained. Retrograde injection of contrast outlined the collecting system. The wire was replaced and then backloaded on the resectoscope sheath with the bridge and a 6 x 24 cm stent was advanced. The wire was removed with a good coil seen in the renal pelvis and a good coil in the bladder.  I then replaced the loop and insured adequate hemostasis. There was an excellent resection down through the muscle but she may need a staged procedure. The right bladder neck had more of an edematous look and I biopsied this separately bypassing the loop and taking a sample. This was fulgurated. Hemostasis was excellent. There was equal return of irrigant throughout  the case. Given the broad-based nature, size and hydronephrosis, elected not to give epirubicin. An 26 French Foley was placed and left to gravity drainage. Drainage was clear. She was awakened and taken to the  recovery room in stable condition.  Complications: None  Blood loss: 30 mL  Specimens to pathology: #1 right bladder tumor #2 right bladder tumor base #3 right bladder neck  Drains: 6 x 24 cm right ureteral stent, 18 French Foley catheter  Disposition: Patient stable to PACU

## 2016-12-28 NOTE — Discharge Instructions (Signed)
Post Anesthesia Home Care Instructions  Activity: Get plenty of rest for the remainder of the day. A responsible adult should stay with you for 24 hours following the procedure.  For the next 24 hours, DO NOT: -Drive a car -Paediatric nurse -Drink alcoholic beverages -Take any medication unless instructed by your physician -Make any legal decisions or sign important papers.  Meals: Start with liquid foods such as gelatin or soup. Progress to regular foods as tolerated. Avoid greasy, spicy, heavy foods. If nausea and/or vomiting occur, drink only clear liquids until the nausea and/or vomiting subsides. Call your physician if vomiting continues.  Special Instructions/Symptoms: Your throat may feel dry or sore from the anesthesia or the breathing tube placed in your throat during surgery. If this causes discomfort, gargle with warm salt water. The discomfort should disappear within 24 hours.  If you had a scopolamine patch placed behind your ear for the management of post- operative nausea and/or vomiting:  1. The medication in the patch is effective for 72 hours, after which it should be removed.  Wrap patch in a tissue and discard in the trash. Wash hands thoroughly with soap and water. 2. You may remove the patch earlier than 72 hours if you experience unpleasant side effects which may include dry mouth, dizziness or visual disturbances. 3. Avoid touching the patch. Wash your hands with soap and water after contact with the patch.   Ureteral Stent Implantation, Care After Introduction Refer to this sheet in the next few weeks. These instructions provide you with information about caring for yourself after your procedure. Your health care provider may also give you more specific instructions. Your treatment has been planned according to current medical practices, but problems sometimes occur. Call your health care provider if you have any problems or questions after your procedure. What  can I expect after the procedure? After the procedure, it is common to have:  Nausea.  Mild pain when you urinate. You may feel this pain in your lower back or lower abdomen. Pain should stop within a few minutes after you urinate. This may last for up to 1 week.  A small amount of blood in your urine for several days.  REMOVAL OF THE STENT: The stent will need to be removed in the office in a few weeks.  Follow these instructions at home:   Medicines  Take over-the-counter and prescription medicines only as told by your health care provider.  If you were prescribed an antibiotic medicine, take it as told by your health care provider. Do not stop taking the antibiotic even if you start to feel better.  Do not drive for 24 hours if you received a sedative.  Do not drive or operate heavy machinery while taking prescription pain medicines. Activity  Return to your normal activities as told by your health care provider. Ask your health care provider what activities are safe for you.  Do not lift anything that is heavier than 10 lb (4.5 kg). Follow this limit for 1 week after your procedure, or for as long as told by your health care provider. General instructions  Watch for any blood in your urine. Call your health care provider if the amount of blood in your urine increases.  If you have a catheter:  Follow instructions from your health care provider about taking care of your catheter and collection bag.  Do not take baths, swim, or use a hot tub until your health care provider approves.  Drink enough  fluid to keep your urine clear or pale yellow.  Keep all follow-up visits as told by your health care provider. This is important. Contact a health care provider if:  You have pain that gets worse or does not get better with medicine, especially pain when you urinate.  You have difficulty urinating.  You feel nauseous or you vomit repeatedly during a period of more than 2 days  after the procedure. Get help right away if:  Your urine is dark red or has blood clots in it.  You are leaking urine (have incontinence).  The end of the stent comes out of your urethra.  You cannot urinate.  You have sudden, sharp, or severe pain in your abdomen or lower back.  You have a fever. This information is not intended to replace advice given to you by your health care provider. Make sure you discuss any questions you have with your health care provider.   Foley Catheter Care, Adult A Foley catheter is a soft, flexible tube. This tube is placed into your bladder to drain pee (urine). If you go home with this catheter in place, follow the instructions below. TAKING CARE OF THE CATHETER 1. Wash your hands with soap and water. 2. Put soap and water on a clean washcloth.  Clean the skin where the tube goes into your body.  Clean away from the tube site.  Never wipe toward the tube.  Clean the area using a circular motion.  Remove all the soap. Pat the area dry with a clean towel. For males, reposition the skin that covers the end of the penis (foreskin). 3. Attach the tube to your leg with tape or a leg strap. Do not stretch the tube tight. If you are using tape, remove any stickiness left behind by past tape you used. 4. Keep the drainage bag below your hips. Keep it off the floor. 5. Check your tube during the day. Make sure it is working and draining. Make sure the tube does not curl, twist, or bend. 6. Do not pull on the tube or try to take it out. TAKING CARE OF THE DRAINAGE BAGS You will have a large overnight drainage bag and a small leg bag. You may wear the overnight bag any time. Never wear the small bag at night. Follow the directions below. Emptying the Drainage Bag  Empty your drainage bag when it is ?- full or at least 2-3 times a day. 1. Wash your hands with soap and water. 2. Keep the drainage bag below your hips. 3. Hold the dirty bag over the toilet  or clean container. 4. Open the pour spout at the bottom of the bag. Empty the pee into the toilet or container. Do not let the pour spout touch anything. 5. Clean the pour spout with a gauze pad or cotton ball that has rubbing alcohol on it. 6. Close the pour spout. 7. Attach the bag to your leg with tape or a leg strap. 8. Wash your hands well. Changing the Drainage Bag  Change your bag once a month or sooner if it starts to smell or look dirty.  1. Wash your hands with soap and water. 2. Pinch the rubber tube so that pee does not spill out. 3. Disconnect the catheter tube from the drainage tube at the connection valve. Do not let the tubes touch anything. 4. Clean the end of the catheter tube with an alcohol wipe. Clean the end of a the drainage tube  with a different alcohol wipe. 5. Connect the catheter tube to the drainage tube of the clean drainage bag. 6. Attach the new bag to the leg with tape or a leg strap. Avoid attaching the new bag too tightly. 7. Wash your hands well. Cleaning the Drainage Bag  1. Wash your hands with soap and water. 2. Wash the bag in warm, soapy water. 3. Rinse the bag with warm water. 4. Fill the bag with a mixture of white vinegar and water (1 cup vinegar to 1 quart warm water [.2 liter vinegar to 1 liter warm water]). Close the bag and soak it for 30 minutes in the solution. 5. Rinse the bag with warm water. 6. Hang the bag to dry with the pour spout open and hanging downward. 7. Store the clean bag (once it is dry) in a clean plastic bag. 8. Wash your hands well. PREVENT INFECTION  Wash your hands before and after touching your tube.  Take showers every day. Wash the skin where the tube enters your body. Do not take baths. Replace wet leg straps with dry ones, if this applies.  Do not use powders, sprays, or lotions on the genital area. Only use creams, lotions, or ointments as told by your doctor.  For females, wipe from front to back after going  to the bathroom.  Drink enough fluids to keep your pee clear or pale yellow unless you are told not to have too much fluid (fluid restriction).  Do not let the drainage bag or tubing touch or lie on the floor.  Wear cotton underwear to keep the area dry. GET HELP IF:  Your pee is cloudy or smells unusually bad.  Your tube becomes clogged.  You are not draining pee into the bag or your bladder feels full.  Your tube starts to leak. GET HELP RIGHT AWAY IF:  You have pain, puffiness (swelling), redness, or yellowish-white fluid (pus) where the tube enters the body.  You have pain in the belly (abdomen), legs, lower back, or bladder.  You have a fever.  You see blood fill the tube, or your pee is pink or red.  You feel sick to your stomach (nauseous), throw up (vomit), or have chills.  Your tube gets pulled out. MAKE SURE YOU:   Understand these instructions.  Will watch your condition.  Will get help right away if you are not doing well or get worse. This information is not intended to replace advice given to you by your health care provider. Make sure you discuss any questions you have with your health care provider. Document Released: 04/09/2013 Document Revised: 01/03/2015 Document Reviewed: 11/29/2015 Elsevier Interactive Patient Education  2017 [employer].

## 2016-12-28 NOTE — Anesthesia Procedure Notes (Addendum)
Procedure Name: LMA Insertion Date/Time: 12/28/2016 9:15 AM Performed by: Justice Rocher Pre-anesthesia Checklist: Patient identified, Emergency Drugs available, Suction available and Patient being monitored Patient Re-evaluated:Patient Re-evaluated prior to inductionOxygen Delivery Method: Circle system utilized Preoxygenation: Pre-oxygenation with 100% oxygen Intubation Type: IV induction Ventilation: Mask ventilation without difficulty LMA: LMA inserted LMA Size: 4.0 Number of attempts: 1 Airway Equipment and Method: Bite block Placement Confirmation: positive ETCO2 and breath sounds checked- equal and bilateral Tube secured with: Tape Dental Injury: Teeth and Oropharynx as per pre-operative assessment  Comments: Discussed with Patient about loose teeth - especially upper right molar. Noted poor dentition poor existing teeth with protrusion . Teeth as preop with LMA placement. Risked explained, pt accepted

## 2016-12-28 NOTE — Progress Notes (Signed)
Late entry for IStat 4 ordered in PACU.

## 2016-12-29 ENCOUNTER — Encounter (HOSPITAL_BASED_OUTPATIENT_CLINIC_OR_DEPARTMENT_OTHER): Payer: Self-pay | Admitting: Urology

## 2016-12-29 LAB — POCT I-STAT 4, (NA,K, GLUC, HGB,HCT)
Glucose, Bld: 137 mg/dL — ABNORMAL HIGH (ref 65–99)
HCT: 36 % (ref 36.0–46.0)
Hemoglobin: 12.2 g/dL (ref 12.0–15.0)
POTASSIUM: 6.1 mmol/L — AB (ref 3.5–5.1)
Sodium: 138 mmol/L (ref 135–145)

## 2017-02-11 ENCOUNTER — Other Ambulatory Visit: Payer: Self-pay

## 2017-03-02 ENCOUNTER — Encounter (HOSPITAL_COMMUNITY): Payer: Self-pay | Admitting: Family Medicine

## 2017-03-02 ENCOUNTER — Ambulatory Visit (HOSPITAL_COMMUNITY)
Admission: EM | Admit: 2017-03-02 | Discharge: 2017-03-02 | Disposition: A | Discharge status: Home / Self Care | Payer: BLUE CROSS/BLUE SHIELD | Attending: Family Medicine | Admitting: Family Medicine

## 2017-03-02 DIAGNOSIS — Z760 Encounter for issue of repeat prescription: Secondary | ICD-10-CM

## 2017-03-02 DIAGNOSIS — I10 Essential (primary) hypertension: Secondary | ICD-10-CM

## 2017-03-02 MED ORDER — AMLODIPINE BESYLATE 10 MG PO TABS: 10.0000 mg | ORAL_TABLET | Freq: Every morning | ORAL | 0 refills | Status: AC

## 2017-03-02 MED ORDER — METOPROLOL TARTRATE 25 MG PO TABS
25.0000 mg | ORAL_TABLET | Freq: Two times a day (BID) | ORAL | 0 refills | Status: DC
Start: 2017-03-02 — End: 2018-07-28

## 2017-03-02 MED ORDER — POLYETHYLENE GLYCOL 3350 17 G PO PACK: 17.0000 g | PACK | ORAL | 2 refills | Status: DC

## 2017-03-02 MED ORDER — SENNOSIDES-DOCUSATE SODIUM 8.6-50 MG PO TABS: 1.0000 | ORAL_TABLET | Freq: Every day | ORAL | 2 refills | Status: AC | PRN

## 2017-03-02 NOTE — ED Triage Notes (Signed)
Bladder cancer in December 2017, various procedures and surgeries since and community providers not taking new patient's.

## 2017-03-02 NOTE — ED Provider Notes (Signed)
[facility]    CSN: [id number] Arrival date & time: 03/02/17  1024     History   Chief Complaint Chief Complaint  Patient presents with  . Medication Refill    HPI Faith [name] is a 66 y.o. female.   Bladder cancer in December 2017, various procedures and surgeries since and community providers not taking new patient's.        Past Medical History:  Diagnosis Date  . Anemia due to acute blood loss 11/24/2016   TRANSFUSED PRBC X2 UNITS 11-25-2016  . Bladder neoplasm   . Constipation   . Gross hematuria   . Hypertension   . Urgency of urination     Patient Active Problem List   Diagnosis Date Noted  . Acute blood loss anemia 11/25/2016  . HTN (hypertension) 11/25/2016  . Gross hematuria 11/25/2016    Past Surgical History:  Procedure Laterality Date  . CYSTOSCOPY W/ URETERAL STENT PLACEMENT Right 12/28/2016   Procedure: CYSTOSCOPY WITH RIGHT RETROGRADE URETERAL STENT PLACEMENT;  Surgeon: Festus Aloe, MD;  Location: [facility];  Service: Urology;  Laterality: Right;  . TRANSURETHRAL RESECTION OF BLADDER TUMOR WITH MITOMYCIN-C N/A 12/28/2016   Procedure: TRANSURETHRAL RESECTION OF BLADDER TUMOR GREATER THAN 5 CM;  Surgeon: Festus Aloe, MD;  Location: [facility];  Service: Urology;  Laterality: N/A;  . TUBAL LIGATION Bilateral 1990's    OB History    No data available       Home Medications    Prior to Admission medications   Medication Sig Start Date End Date Taking? Authorizing Provider  amLODipine (NORVASC) 10 MG tablet Take 1 tablet (10 mg total) by mouth every morning. 03/02/17   Robyn Haber, MD  ferrous sulfate 325 (65 FE) MG tablet Start taking only after constipation is relieved. Take 1 tablet once daily for 1 week, then 1 tablet twice daily for 1 week and then 1 tablet three times daily. Patient taking differently: Take 325 mg by mouth. Start taking only after constipation is relieved. Take 1  tablet once daily for 1 week, then 1 tablet twice daily for 1 week and then 1 tablet three times daily. 11/26/16   Bonnielee Haff, MD  metoprolol tartrate (LOPRESSOR) 25 MG tablet Take 1 tablet (25 mg total) by mouth 2 (two) times daily. 03/02/17   Robyn Haber, MD  polyethylene glycol [facility] / Floria Raveling) packet Take 17 g by mouth every other day. 03/02/17   Robyn Haber, MD  senna-docusate (SENOKOT-S) 8.6-50 MG tablet Take 1 tablet by mouth daily as needed. 03/02/17   Robyn Haber, MD  traMADol (ULTRAM) 50 MG tablet Take 1 tablet (50 mg total) by mouth every 6 (six) hours as needed. 12/28/16   Festus Aloe, MD    Family History Family History  Problem Relation Age of Onset  . Lung cancer Father     Social History Social History  Substance Use Topics  . Smoking status: Never Smoker  . Smokeless tobacco: Never Used  . Alcohol use No     Allergies   Patient has no known allergies.   Review of Systems Review of Systems  Constitutional: Negative.   HENT: Negative.   Respiratory: Negative.   Cardiovascular: Negative.   Gastrointestinal: Negative.   Genitourinary: Negative.   Neurological: Negative.      Physical Exam Triage Vital Signs ED Triage Vitals [03/02/17 1130]  Enc Vitals Group     BP (!) 208/75     Pulse Rate 118  Resp 18     Temp 98.7 F (37.1 C)     Temp Source Oral     SpO2 100 %     Weight      Height      Head Circumference      Peak Flow      Pain Score      Pain Loc      Pain Edu?      Excl. in [place]?    No data found.   Updated Vital Signs BP (!) 208/75 (BP Location: Left Arm)   Pulse 118   Temp 98.7 F (37.1 C) (Oral)   Resp 18   SpO2 100%    Physical Exam  Constitutional: She is oriented to person, place, and time. She appears well-developed and well-nourished.  HENT:  Head: Normocephalic.  Right Ear: External ear normal.  Left Ear: External ear normal.  Mouth/Throat: Oropharynx is clear and moist.  Eyes: Conjunctivae and  EOM are normal. Pupils are equal, round, and reactive to light.  Neck: Normal range of motion. Neck supple.  Cardiovascular: Normal rate, regular rhythm and normal heart sounds.   Pulmonary/Chest: Effort normal.  Musculoskeletal: Normal range of motion.  Neurological: She is alert and oriented to person, place, and time.  Skin: Skin is warm and dry.  Nursing note and vitals reviewed.    UC Treatments / Results  Labs (all labs ordered are listed, but only abnormal results are displayed) Labs Reviewed - No data to display  EKG  EKG Interpretation None       Radiology No results found.  Procedures Procedures (including critical care time)  Medications Ordered in UC Medications - No data to display   Initial Impression / Assessment and Plan / UC Course  I have reviewed the triage vital signs and the nursing notes.  Pertinent labs & imaging results that were available during my care of the patient were reviewed by me and considered in my medical decision making (see chart for details).     Final Clinical Impressions(s) / UC Diagnoses   Final diagnoses:  Essential hypertension  Medication refill    New Prescriptions Current Discharge Medication List    meds refilled and referral made   Robyn Haber, MD 03/02/17 1149

## 2017-05-28 NOTE — Addendum Note (Signed)
Addendum  created 05/28/17 0959 by Duane Boston, MD   Sign clinical note

## 2017-05-28 NOTE — Addendum Note (Signed)
Addendum  created 05/28/17 0749 by Duane Boston, MD   Sign clinical note

## 2017-08-12 ENCOUNTER — Other Ambulatory Visit: Payer: Self-pay

## 2017-11-30 ENCOUNTER — Other Ambulatory Visit: Payer: Self-pay | Admitting: Family Medicine

## 2017-11-30 DIAGNOSIS — R739 Hyperglycemia, unspecified: Secondary | ICD-10-CM | POA: Diagnosis not present

## 2017-11-30 DIAGNOSIS — Z139 Encounter for screening, unspecified: Secondary | ICD-10-CM

## 2017-11-30 DIAGNOSIS — Z6832 Body mass index (BMI) 32.0-32.9, adult: Secondary | ICD-10-CM | POA: Diagnosis not present

## 2017-11-30 DIAGNOSIS — Z1211 Encounter for screening for malignant neoplasm of colon: Secondary | ICD-10-CM | POA: Diagnosis not present

## 2017-11-30 DIAGNOSIS — Z0000 Encounter for general adult medical examination without abnormal findings: Secondary | ICD-10-CM | POA: Diagnosis not present

## 2017-11-30 DIAGNOSIS — E2839 Other primary ovarian failure: Secondary | ICD-10-CM

## 2017-12-08 DIAGNOSIS — N3941 Urge incontinence: Secondary | ICD-10-CM | POA: Diagnosis not present

## 2017-12-08 DIAGNOSIS — C672 Malignant neoplasm of lateral wall of bladder: Secondary | ICD-10-CM | POA: Diagnosis not present

## 2018-01-03 DIAGNOSIS — C672 Malignant neoplasm of lateral wall of bladder: Secondary | ICD-10-CM | POA: Diagnosis not present

## 2018-01-03 DIAGNOSIS — Z5111 Encounter for antineoplastic chemotherapy: Secondary | ICD-10-CM | POA: Diagnosis not present

## 2018-01-10 DIAGNOSIS — Z5111 Encounter for antineoplastic chemotherapy: Secondary | ICD-10-CM | POA: Diagnosis not present

## 2018-01-10 DIAGNOSIS — C672 Malignant neoplasm of lateral wall of bladder: Secondary | ICD-10-CM | POA: Diagnosis not present

## 2018-01-11 ENCOUNTER — Other Ambulatory Visit: Payer: BLUE CROSS/BLUE SHIELD

## 2018-01-11 ENCOUNTER — Ambulatory Visit: Payer: BLUE CROSS/BLUE SHIELD

## 2018-01-11 ENCOUNTER — Encounter: Payer: Self-pay | Admitting: Family Medicine

## 2018-01-17 DIAGNOSIS — Z5111 Encounter for antineoplastic chemotherapy: Secondary | ICD-10-CM | POA: Diagnosis not present

## 2018-01-17 DIAGNOSIS — C672 Malignant neoplasm of lateral wall of bladder: Secondary | ICD-10-CM | POA: Diagnosis not present

## 2018-03-28 DIAGNOSIS — E782 Mixed hyperlipidemia: Secondary | ICD-10-CM | POA: Diagnosis not present

## 2018-03-28 DIAGNOSIS — I10 Essential (primary) hypertension: Secondary | ICD-10-CM | POA: Diagnosis not present

## 2018-03-28 DIAGNOSIS — Z6836 Body mass index (BMI) 36.0-36.9, adult: Secondary | ICD-10-CM | POA: Diagnosis not present

## 2018-03-28 DIAGNOSIS — E1165 Type 2 diabetes mellitus with hyperglycemia: Secondary | ICD-10-CM | POA: Diagnosis not present

## 2018-05-17 DIAGNOSIS — N3941 Urge incontinence: Secondary | ICD-10-CM | POA: Diagnosis not present

## 2018-05-17 DIAGNOSIS — C672 Malignant neoplasm of lateral wall of bladder: Secondary | ICD-10-CM | POA: Diagnosis not present

## 2018-05-23 DIAGNOSIS — E782 Mixed hyperlipidemia: Secondary | ICD-10-CM | POA: Diagnosis not present

## 2018-05-23 DIAGNOSIS — R5383 Other fatigue: Secondary | ICD-10-CM | POA: Diagnosis not present

## 2018-05-23 DIAGNOSIS — Z6831 Body mass index (BMI) 31.0-31.9, adult: Secondary | ICD-10-CM | POA: Diagnosis not present

## 2018-05-23 DIAGNOSIS — E559 Vitamin D deficiency, unspecified: Secondary | ICD-10-CM | POA: Diagnosis not present

## 2018-05-23 DIAGNOSIS — G4700 Insomnia, unspecified: Secondary | ICD-10-CM | POA: Diagnosis not present

## 2018-05-23 DIAGNOSIS — I10 Essential (primary) hypertension: Secondary | ICD-10-CM | POA: Diagnosis not present

## 2018-05-23 DIAGNOSIS — R42 Dizziness and giddiness: Secondary | ICD-10-CM | POA: Diagnosis not present

## 2018-05-25 DIAGNOSIS — E876 Hypokalemia: Secondary | ICD-10-CM | POA: Diagnosis not present

## 2018-05-25 DIAGNOSIS — I10 Essential (primary) hypertension: Secondary | ICD-10-CM | POA: Diagnosis not present

## 2018-05-25 DIAGNOSIS — E782 Mixed hyperlipidemia: Secondary | ICD-10-CM | POA: Diagnosis not present

## 2018-05-25 DIAGNOSIS — E559 Vitamin D deficiency, unspecified: Secondary | ICD-10-CM | POA: Diagnosis not present

## 2018-05-30 DIAGNOSIS — R42 Dizziness and giddiness: Secondary | ICD-10-CM | POA: Diagnosis not present

## 2018-05-30 DIAGNOSIS — R739 Hyperglycemia, unspecified: Secondary | ICD-10-CM | POA: Diagnosis not present

## 2018-05-30 DIAGNOSIS — I10 Essential (primary) hypertension: Secondary | ICD-10-CM | POA: Diagnosis not present

## 2018-05-30 DIAGNOSIS — Z6832 Body mass index (BMI) 32.0-32.9, adult: Secondary | ICD-10-CM | POA: Diagnosis not present

## 2018-06-02 ENCOUNTER — Encounter: Payer: Self-pay | Admitting: Internal Medicine

## 2018-07-03 DIAGNOSIS — I10 Essential (primary) hypertension: Secondary | ICD-10-CM | POA: Diagnosis not present

## 2018-07-03 DIAGNOSIS — E782 Mixed hyperlipidemia: Secondary | ICD-10-CM | POA: Diagnosis not present

## 2018-07-03 DIAGNOSIS — Z6832 Body mass index (BMI) 32.0-32.9, adult: Secondary | ICD-10-CM | POA: Diagnosis not present

## 2018-07-03 DIAGNOSIS — K219 Gastro-esophageal reflux disease without esophagitis: Secondary | ICD-10-CM | POA: Diagnosis not present

## 2018-07-14 DIAGNOSIS — Z6832 Body mass index (BMI) 32.0-32.9, adult: Secondary | ICD-10-CM | POA: Diagnosis not present

## 2018-07-14 DIAGNOSIS — K219 Gastro-esophageal reflux disease without esophagitis: Secondary | ICD-10-CM | POA: Diagnosis not present

## 2018-07-14 DIAGNOSIS — J309 Allergic rhinitis, unspecified: Secondary | ICD-10-CM | POA: Diagnosis not present

## 2018-07-14 DIAGNOSIS — M549 Dorsalgia, unspecified: Secondary | ICD-10-CM | POA: Diagnosis not present

## 2018-07-17 ENCOUNTER — Ambulatory Visit
Admission: RE | Admit: 2018-07-17 | Discharge: 2018-07-17 | Disposition: A | Discharge status: Home / Self Care | Payer: Medicare Other | Source: Ambulatory Visit | Attending: Family Medicine | Admitting: Family Medicine

## 2018-07-17 ENCOUNTER — Other Ambulatory Visit: Payer: Self-pay | Admitting: Family Medicine

## 2018-07-17 DIAGNOSIS — R0781 Pleurodynia: Secondary | ICD-10-CM

## 2018-07-21 ENCOUNTER — Ambulatory Visit
Admission: RE | Admit: 2018-07-21 | Discharge: 2018-07-21 | Disposition: A | Discharge status: Home / Self Care | Payer: Medicare Other | Source: Ambulatory Visit | Attending: Family Medicine | Admitting: Family Medicine

## 2018-07-21 ENCOUNTER — Other Ambulatory Visit: Payer: Self-pay | Admitting: Family Medicine

## 2018-07-21 ENCOUNTER — Other Ambulatory Visit: Payer: Medicare Other

## 2018-07-21 DIAGNOSIS — Z8551 Personal history of malignant neoplasm of bladder: Secondary | ICD-10-CM

## 2018-07-21 DIAGNOSIS — J189 Pneumonia, unspecified organism: Secondary | ICD-10-CM | POA: Diagnosis not present

## 2018-07-21 DIAGNOSIS — C679 Malignant neoplasm of bladder, unspecified: Secondary | ICD-10-CM | POA: Diagnosis not present

## 2018-07-21 DIAGNOSIS — S34112A Complete lesion of L2 level of lumbar spinal cord, initial encounter: Secondary | ICD-10-CM | POA: Diagnosis not present

## 2018-07-21 DIAGNOSIS — R1010 Upper abdominal pain, unspecified: Secondary | ICD-10-CM

## 2018-07-21 DIAGNOSIS — R109 Unspecified abdominal pain: Secondary | ICD-10-CM

## 2018-07-21 DIAGNOSIS — R935 Abnormal findings on diagnostic imaging of other abdominal regions, including retroperitoneum: Secondary | ICD-10-CM

## 2018-07-21 DIAGNOSIS — R1013 Epigastric pain: Secondary | ICD-10-CM | POA: Diagnosis not present

## 2018-07-21 DIAGNOSIS — R1012 Left upper quadrant pain: Secondary | ICD-10-CM | POA: Diagnosis not present

## 2018-07-21 HISTORY — DX: Malignant (primary) neoplasm, unspecified: C80.1

## 2018-07-21 MED ORDER — IOPAMIDOL (ISOVUE-300) INJECTION 61%
100.0000 mL | Freq: Once | INTRAVENOUS | Status: AC | PRN
  Administered 2018-07-21: 100 mL via INTRAVENOUS

## 2018-07-24 ENCOUNTER — Other Ambulatory Visit: Payer: Self-pay | Admitting: Family Medicine

## 2018-07-24 ENCOUNTER — Ambulatory Visit
Admission: RE | Admit: 2018-07-24 | Discharge: 2018-07-24 | Disposition: A | Discharge status: Home / Self Care | Payer: Medicare Other | Source: Ambulatory Visit | Attending: Family Medicine | Admitting: Family Medicine

## 2018-07-24 DIAGNOSIS — J90 Pleural effusion, not elsewhere classified: Secondary | ICD-10-CM | POA: Diagnosis not present

## 2018-07-24 DIAGNOSIS — R935 Abnormal findings on diagnostic imaging of other abdominal regions, including retroperitoneum: Secondary | ICD-10-CM

## 2018-07-24 MED ORDER — IOPAMIDOL (ISOVUE-300) INJECTION 61%
75.0000 mL | Freq: Once | INTRAVENOUS | Status: AC | PRN
  Administered 2018-07-24: 75 mL via INTRAVENOUS

## 2018-07-25 ENCOUNTER — Other Ambulatory Visit (HOSPITAL_COMMUNITY): Payer: Self-pay | Admitting: Family Medicine

## 2018-07-25 DIAGNOSIS — K5900 Constipation, unspecified: Secondary | ICD-10-CM | POA: Diagnosis not present

## 2018-07-25 DIAGNOSIS — C679 Malignant neoplasm of bladder, unspecified: Secondary | ICD-10-CM | POA: Diagnosis not present

## 2018-07-25 DIAGNOSIS — C7951 Secondary malignant neoplasm of bone: Secondary | ICD-10-CM | POA: Diagnosis not present

## 2018-07-25 DIAGNOSIS — Z6832 Body mass index (BMI) 32.0-32.9, adult: Secondary | ICD-10-CM | POA: Diagnosis not present

## 2018-07-26 ENCOUNTER — Institutional Professional Consult (permissible substitution): Payer: Self-pay | Admitting: Neurology

## 2018-07-26 DIAGNOSIS — Z8551 Personal history of malignant neoplasm of bladder: Secondary | ICD-10-CM | POA: Diagnosis not present

## 2018-07-26 DIAGNOSIS — C7951 Secondary malignant neoplasm of bone: Secondary | ICD-10-CM | POA: Diagnosis not present

## 2018-07-26 DIAGNOSIS — C778 Secondary and unspecified malignant neoplasm of lymph nodes of multiple regions: Secondary | ICD-10-CM | POA: Diagnosis not present

## 2018-07-27 ENCOUNTER — Telehealth: Payer: Self-pay | Admitting: Internal Medicine

## 2018-07-27 NOTE — Telephone Encounter (Signed)
Patient has been referred by Dr. Rachell Cipro for evidence of metastatic cancer. Pt has been scheduled to see Dr. Julien Nordmann on 8/2 at 10am. Pt aware to arrive 30 minutes early for labs.

## 2018-07-28 ENCOUNTER — Telehealth: Payer: Self-pay

## 2018-07-28 ENCOUNTER — Inpatient Hospital Stay: Payer: Medicare Other

## 2018-07-28 ENCOUNTER — Inpatient Hospital Stay: Payer: Medicare Other | Attending: Internal Medicine | Admitting: Internal Medicine

## 2018-07-28 ENCOUNTER — Encounter: Payer: Self-pay | Admitting: Internal Medicine

## 2018-07-28 ENCOUNTER — Other Ambulatory Visit: Payer: Self-pay | Admitting: Medical Oncology

## 2018-07-28 VITALS — BP 177/61 | HR 90 | Temp 99.6°F | Resp 18 | Ht 61.5 in | Wt 174.6 lb

## 2018-07-28 DIAGNOSIS — C7951 Secondary malignant neoplasm of bone: Secondary | ICD-10-CM

## 2018-07-28 DIAGNOSIS — R918 Other nonspecific abnormal finding of lung field: Secondary | ICD-10-CM | POA: Diagnosis not present

## 2018-07-28 DIAGNOSIS — R590 Localized enlarged lymph nodes: Secondary | ICD-10-CM

## 2018-07-28 DIAGNOSIS — C801 Malignant (primary) neoplasm, unspecified: Secondary | ICD-10-CM

## 2018-07-28 DIAGNOSIS — Z8551 Personal history of malignant neoplasm of bladder: Secondary | ICD-10-CM | POA: Insufficient documentation

## 2018-07-28 DIAGNOSIS — C679 Malignant neoplasm of bladder, unspecified: Secondary | ICD-10-CM | POA: Insufficient documentation

## 2018-07-28 DIAGNOSIS — I10 Essential (primary) hypertension: Secondary | ICD-10-CM | POA: Diagnosis not present

## 2018-07-28 LAB — CBC WITH DIFFERENTIAL (CANCER CENTER ONLY)
BASOS ABS: 0.1 10*3/uL (ref 0.0–0.1)
Basophils Relative: 1 %
Eosinophils Absolute: 0.2 10*3/uL (ref 0.0–0.5)
Eosinophils Relative: 3 %
HEMATOCRIT: 36.1 % (ref 34.8–46.6)
Hemoglobin: 12.1 g/dL (ref 11.6–15.9)
LYMPHS ABS: 1.1 10*3/uL (ref 0.9–3.3)
LYMPHS PCT: 14 %
MCH: 27 pg (ref 25.1–34.0)
MCHC: 33.5 g/dL (ref 31.5–36.0)
MCV: 80.6 fL (ref 79.5–101.0)
Monocytes Absolute: 0.5 10*3/uL (ref 0.1–0.9)
Monocytes Relative: 7 %
NEUTROS ABS: 6.1 10*3/uL (ref 1.5–6.5)
NEUTROS PCT: 75 %
Platelet Count: 299 10*3/uL (ref 145–400)
RBC: 4.48 MIL/uL (ref 3.70–5.45)
RDW: 13.3 % (ref 11.2–14.5)
WBC Count: 8 10*3/uL (ref 3.9–10.3)

## 2018-07-28 LAB — CMP (CANCER CENTER ONLY)
ALT: 8 U/L (ref 0–44)
ANION GAP: 10 (ref 5–15)
AST: 13 U/L — ABNORMAL LOW (ref 15–41)
Albumin: 3.5 g/dL (ref 3.5–5.0)
Alkaline Phosphatase: 80 U/L (ref 38–126)
BUN: 18 mg/dL (ref 8–23)
CHLORIDE: 103 mmol/L (ref 98–111)
CO2: 27 mmol/L (ref 22–32)
Calcium: 10 mg/dL (ref 8.9–10.3)
Creatinine: 1.13 mg/dL — ABNORMAL HIGH (ref 0.44–1.00)
GFR, EST AFRICAN AMERICAN: 58 mL/min — AB (ref >60)
GFR, Estimated: 50 mL/min — ABNORMAL LOW (ref >60)
Glucose, Bld: 116 mg/dL — ABNORMAL HIGH (ref 70–99)
POTASSIUM: 4 mmol/L (ref 3.5–5.1)
SODIUM: 140 mmol/L (ref 135–145)
Total Bilirubin: 0.4 mg/dL (ref 0.3–1.2)
Total Protein: 8.1 g/dL (ref 6.5–8.1)

## 2018-07-28 NOTE — Progress Notes (Signed)
[place] Telephone:([phone]   [phone]  CONSULT NOTE  REFERRING PHYSICIAN: Dr. Rachell Cipro  REASON FOR CONSULTATION:  67 years old African-American female with suspicious metastatic cancer.  HPI Faith [name] is a 67 y.o. female with past medical history significant for hypertension, anemia as well as history of invasive high-grade papillary urothelial carcinoma of the bladder status post transurethral resection under the care of Dr. Junious Silk on December 28, 2016.  The patient has been on observation since that time and repeat cystoscopy few times including an exam yesterday showed no concerning findings for recurrence.  The patient has been complaining for low back pain as well as left upper quadrant pain for 2 months.  She was seen by her primary care physician and treated for acid reflux and bloating with no improvement.  She was also treated with cough medication for suspicious bronchitis with no improvement.  Chest x-ray was performed on 07/17/2018 and that showed patchy increased density in the lingula and anterior aspect of the left lower lobe suspicious for atelectasis or pneumonia.  This was followed by CT scan of the abdomen pelvis on 07/21/2018 and that showed several new sclerotic lesions within the lumbar spine and sacrum highly concerning for skeletal metastasis.  There was also new rounded nodule at the left lung base suspicious for metastasis.  There was no evidence of local bladder cancer recurrence.  CT of the chest on 07/24/2018 showed bony metastatic disease involving the thoracic spine and to left medial ribs.  There is significant lytic involvement of the posterior elements of T6 and T5.  These findings are most market at T6.  There is no definite extension into the canal.  There was adenopathy in the mediastinum consistent with metastatic disease.  There was nodularity in the lungs most of the nodules likely represent metastatic disease.  There  was also a small left pleural effusion. The patient is scheduled for a PET scan on 08/04/2018. She was referred by her primary care physician for evaluation and recommendation regarding her condition. When seen today she continues to have pain in the back and left upper quadrant.  She rates her pain as 7 on a scale from 1-10.  She has prescription for tramadol by her primary care physician but she had not refilled it yet.  She denied having any current chest pain, shortness breath, cough or hemoptysis.  She denied having any nausea or vomiting.  She has no significant weight loss or night sweats.  She has no headache or visual changes. Family history significant for father with lung cancer.  Mother still alive at age 4.  Brother had prostate cancer. The patient is a widow and has 3 sons.  She worked all her life in [place].  She has no history for smoking, alcohol or drug abuse.  HPI  Past Medical History:  Diagnosis Date  . Anemia due to acute blood loss 11/24/2016   TRANSFUSED PRBC X2 UNITS 11-25-2016  . Bladder neoplasm   . Cancer ([place])   . Constipation   . Gross hematuria   . Hypertension   . Urgency of urination     Past Surgical History:  Procedure Laterality Date  . CYSTOSCOPY W/ URETERAL STENT PLACEMENT Right 12/28/2016   Procedure: CYSTOSCOPY WITH RIGHT RETROGRADE URETERAL STENT PLACEMENT;  Surgeon: Festus Aloe, MD;  Location: [facility];  Service: Urology;  Laterality: Right;  . TRANSURETHRAL RESECTION OF BLADDER TUMOR WITH MITOMYCIN-C N/A 12/28/2016  Procedure: TRANSURETHRAL RESECTION OF BLADDER TUMOR GREATER THAN 5 CM;  Surgeon: Festus Aloe, MD;  Location: [facility];  Service: Urology;  Laterality: N/A;  . TUBAL LIGATION Bilateral 1990's    Family History  Problem Relation Age of Onset  . Lung cancer Father     Social History Social History   Tobacco Use  . Smoking status: Never Smoker  . Smokeless tobacco: Never Used    Substance Use Topics  . Alcohol use: No  . Drug use: No    No Known Allergies  Current Outpatient Medications  Medication Sig Dispense Refill  . amLODipine (NORVASC) 10 MG tablet Take 1 tablet (10 mg total) by mouth every morning. 90 tablet 0  . ferrous sulfate 325 (65 FE) MG tablet Start taking only after constipation is relieved. Take 1 tablet once daily for 1 week, then 1 tablet twice daily for 1 week and then 1 tablet three times daily. (Patient taking differently: Take 325 mg by mouth. Start taking only after constipation is relieved. Take 1 tablet once daily for 1 week, then 1 tablet twice daily for 1 week and then 1 tablet three times daily.) 90 tablet 3  . metoprolol tartrate (LOPRESSOR) 25 MG tablet Take 1 tablet (25 mg total) by mouth 2 (two) times daily. 180 tablet 0  . polyethylene glycol (MIRALAX / GLYCOLAX) packet Take 17 g by mouth every other day. 30 each 2  . senna-docusate (SENOKOT-S) 8.6-50 MG tablet Take 1 tablet by mouth daily as needed. 30 tablet 2  . traMADol (ULTRAM) 50 MG tablet Take 1 tablet (50 mg total) by mouth every 6 (six) hours as needed. 30 tablet 0   No current facility-administered medications for this visit.     Review of Systems  Constitutional: positive for fatigue Eyes: negative Ears, nose, mouth, throat, and face: negative Respiratory: negative Cardiovascular: negative Gastrointestinal: negative Genitourinary:negative Integument/breast: negative Hematologic/lymphatic: negative Musculoskeletal:positive for back pain and bone pain Neurological: negative Behavioral/Psych: negative Endocrine: negative Allergic/Immunologic: negative  Physical Exam  UUV:OZDGU, healthy, no distress, well nourished and well developed SKIN: skin color, texture, turgor are normal, no rashes or significant lesions HEAD: Normocephalic, No masses, lesions, tenderness or abnormalities EYES: normal, PERRLA, Conjunctiva are pink and non-injected EARS: External ears  normal, Canals clear OROPHARYNX:no exudate and no erythema  NECK: supple, no adenopathy, no JVD LYMPH:  no palpable lymphadenopathy, no hepatosplenomegaly BREAST:not examined LUNGS: clear to auscultation , and palpation HEART: regular rate & rhythm, no murmurs and no gallops ABDOMEN:abdomen soft, non-tender, normal bowel sounds and no masses or organomegaly BACK: Back symmetric, no curvature., No CVA tenderness EXTREMITIES:no joint deformities, effusion, or inflammation, no edema  NEURO: alert & oriented x 3 with fluent speech, no focal motor/sensory deficits  PERFORMANCE STATUS: ECOG 1  LABORATORY DATA: Lab Results  Component Value Date   WBC 8.0 07/28/2018   HGB 12.1 07/28/2018   HCT 36.1 07/28/2018   MCV 80.6 07/28/2018   PLT 299 07/28/2018      Chemistry      Component Value Date/Time   NA 140 12/28/2016 1121   K 4.1 12/28/2016 1121   CL 104 12/28/2016 0850   CO2 26 11/26/2016 0259   BUN 28 (H) 12/28/2016 0850   CREATININE 0.90 12/28/2016 0850      Component Value Date/Time   CALCIUM 9.0 11/26/2016 0259       RADIOGRAPHIC STUDIES: Ct Abdomen Pelvis W Wo Contrast  Addendum Date: 07/21/2018   ADDENDUM REPORT: 07/21/2018 13:39 ADDENDUM:  Additional findings of skeletal metastasis: Lytic lesion within the LEFT transverse process of the L2 vertebral body (image 23/2). This may explain patient's LEFT upper quadrant pain. This lesion is expansile and erodes the cortex. Additional lytic lesion in the superior endplate of the L1 vertebral body (image 64/5.) Small nodule in the inferior lingula measuring 6 mm also concerning for pulmonary metastasis. Recommend urology/oncology consultation for bladder cancer recurrence. Consider FDG PET scan for biopsy planning and re staging. Findings conveyed toELIZABETH DEWEY on 07/21/2018  at13:38. Electronically Signed   By: Suzy Bouchard M.D.   On: 07/21/2018 13:39   Result Date: 07/21/2018 CLINICAL DATA:  LEFT flank pain and LEFT upper  quadrant pain EXAM: CT ABDOMEN AND PELVIS WITHOUT AND WITH CONTRAST TECHNIQUE: Multidetector CT imaging of the abdomen and pelvis was performed following the standard protocol before and following the bolus administration of intravenous contrast. CONTRAST:  11mL ISOVUE-300 IOPAMIDOL (ISOVUE-300) INJECTION 61% Creatinine was obtained on site at [facility] at [address] Results: Creatinine 1.2 mg/dL. COMPARISON:  CT 07/26/2017 FINDINGS: Lower chest: 15 mm nodule the RIGHT lung base (image 19/3) is new from prior. Band of atelectasis in the LEFT lower lobe is also new. Hepatobiliary: No focal hepatic lesion. No biliary duct dilatation. Gallbladder is normal. Common bile duct is normal. Pancreas: Pancreas is normal. No ductal dilatation. No pancreatic inflammation. Spleen: Normal spleen Adrenals/urinary tract: Adrenal glands are normal. No nephrolithiasis ureterolithiasis. Large nonenhancing cysts within the LEFT kidney measures 4.0 cm. Delayed imaging demonstrates no filling defect within the collecting system or bladder. Most inferior RIGHT aspect of the bladder there is an indentation by external soft tissue seen on image 6867 of series 6 and image 9 of series 11. Comparison exam from 07/26/2017 was calcifications in this region and similar indentation. Stomach/Bowel: Stomach, small bowel, appendix, and cecum are normal. The colon and rectosigmoid colon are normal. Vascular/Lymphatic: Abdominal aorta is normal caliber with atherosclerotic calcification. There is no retroperitoneal or periportal lymphadenopathy. No pelvic lymphadenopathy. Reproductive: Uterus and ovaries normal. Other: No free fluid. Musculoskeletal: Rounded sclerotic lesion within the L4 vertebral body measuring 8 mm (image 61/5) is new from 07/26/2017 potential lesion in the superior anterior endplate region of the L4 vertebral body (image 26/6 small sclerotic lesion in the RIGHT sacral ala measuring 4 mm is also new. In  IMPRESSION: 1. Several new sclerotic lesions within the lumbar spine and sacrum are highly concerning for skeletal metastasis. IF concern for bladder cancer metastatic disease, consider FDG PET scan further evaluation. 2. New rounded nodule at the LEFT lung base. In light of the new presumed skeletal metastasis this finding is concerning. Recommend FDG PET scan as above or at minimum a contrast chest CT. 3. No evidence of local bladder cancer recurrence. Indentation at the most inferior RIGHT aspect the bladder may relate to prior surgery. These results will be called to the ordering clinician or representative by the Radiologist Assistant, and communication documented in the PACS or zVision Dashboard. Electronically Signed: By: Suzy Bouchard M.D. On: 07/21/2018 11:38   Dg Chest 2 View  Result Date: 07/17/2018 CLINICAL DATA:  Anterior left ribcage pain deep to the breast for the past month possibly cause by lifting cases of water. EXAM: CHEST - 2 VIEW COMPARISON:  None in PACs FINDINGS: The lungs are subjectively adequately inflated. There is hazy increased density in the lingula. There is no pleural effusion. The heart and pulmonary vascularity are normal. There is calcification in the wall of the  aortic arch. The observed bony thorax exhibits no acute abnormality. IMPRESSION: Patchy increased density in the lingula and anterior aspect of the left lower lobe may reflect atelectasis or pneumonia. Followup PA and lateral chest X-ray is recommended in 3-4 weeks following trial of antibiotic therapy to ensure resolution and exclude underlying malignancy. Thoracic aortic atherosclerosis. Electronically Signed   By: David  Martinique M.D.   On: 07/17/2018 12:50   Dg Ribs Unilateral Left  Result Date: 07/17/2018 CLINICAL DATA:  Left ribcage pain for the past month likely secondary to lifting heavy cases of water. EXAM: LEFT RIBS - 2 VIEW COMPARISON:  Chest x-ray of today's date FINDINGS: Two views of the left ribs  reveal no acute or healing fracture. There is no lytic or blastic lesion. IMPRESSION: There is no acute abnormality of the visualized portions of the left ribs. Electronically Signed   By: David  Martinique M.D.   On: 07/17/2018 12:51   Ct Chest W Contrast  Result Date: 07/24/2018 CLINICAL DATA:  Abnormal lung findings on CT the abdomen and pelvis. Left upper quadrant left rib pain deep under left breast. EXAM: CT CHEST WITH CONTRAST TECHNIQUE: Multidetector CT imaging of the chest was performed during intravenous contrast administration. CONTRAST:  60mL ISOVUE-300 IOPAMIDOL (ISOVUE-300) INJECTION 61% COMPARISON:  None. FINDINGS: Cardiovascular: The heart is unremarkable. No obvious coronary artery calcifications. Mild atherosclerotic change in the thoracic aorta without aneurysm or dissection. The main pulmonary artery is normal. No central pulmonary arterial filling defects. Mediastinum/Nodes: The thyroid and esophagus are normal. The chest wall is normal. There is adenopathy in the mediastinum involving the right hilum, prevascular space on series 2, image 42, the AP window, subcarinal region, and the right paratracheal chain. The prevascular space node measures 16 mm in short axis and demonstrates central low attenuation suggesting the possibility of a developing necrotic metastasis. There is a small left pleural effusion. No pericardial effusion. Lungs/Pleura: Central airways are normal. No pneumothorax. Opacity in the right apex on series 3, image 21 demonstrates somewhat nodular components. There is similar mild opacity in the medial right apex on series 3, image 20. There is a peripheral nodule in the anterior right lung on series 3, image 61 measuring 7.9 mm in greatest dimension. There is a nodule in the superior segment of the right lower lobe measuring 7.5 mm on image 46. There is a nodule in the medial right lung base measuring 1.7 cm on series 3, image 94. Immediately superior and adjacent to this  nodule is another on image 90 measuring 9 mm. There is a tiny nodule in the left upper lobe on series 3, image 43. A larger nodule is seen in the lingula on series 3, image 69 measuring 1.8 cm. Small lingular nodules are seen on images 88 and 97. Nodularity in the left base is seen on series 3, image 68 and image 79 opacity in the left base has increased in the interval, likely atelectasis. There is also atelectasis in the lingula. Upper Abdomen: No acute abnormality. Musculoskeletal: Multiple bony metastases seen throughout the thoracic spine. There is involvement of T5, T6, probably T8, and probably T4. The most extensive involvement is at T5 and T6 involving the posterior elements including the left transverse processes and pedicles. There is also involvement of the right T5 pedicle and posterior elements including the spinous process. There appears to be involvement of the medial left fifth and sixth ribs. There is possible minimal involvement of the T5 transverse process. The canal remains patent.  No definitive extension of metastatic disease into the canal although MRI would be more sensitive. IMPRESSION: 1. Bony metastatic disease involving the thoracic spine and 2 left medial ribs as above. There is significant lytic involvement of the posterior elements of T6 and T5. The findings are most marked at T6. There is no definitive extension into the canal but MRI would be more sensitive. 2. Adenopathy in the mediastinum is consistent with metastatic disease. The low-attenuation centrally suggests necrotic nodes. 3. Nodularity in the lungs as above. Most of the nodules likely represent metastatic disease. The clustered nodularity in the right apex could be infectious or metastatic. 4. Small left pleural effusion. 5. Atherosclerotic changes in the thoracic aorta. These results will be called to the ordering clinician or representative by the Radiologist Assistant, and communication documented in the PACS or zVision  Dashboard. Aortic Atherosclerosis (ICD10-I70.0). Electronically Signed   By: Dorise Bullion III M.D   On: 07/24/2018 16:04    ASSESSMENT: This is a very pleasant 67 years old African-American female with highly suspicious metastatic neoplasm of unclear etiology at this point.  The patient has a history of locally advanced high-grade urothelial carcinoma status post resection in January of 2018   PLAN: I had a lengthy discussion with the patient today about her current disease status and further investigation to confirm her diagnosis.  I personally and independently reviewed her scan images and discussed the results with the patient today. I agree with proceeding with a PET scan as scheduled next week for further identification of her disease and to choose the right lesion for biopsy. This lesion could be secondary to her previous history of urothelial carcinoma of the bladder but other malignancy cannot be excluded at this point. For pain management she will start treatment with tramadol on as-needed basis. For hypertension the patient was advised to take her blood pressure medication as prescribed and to reconsult with her primary care physician for adjustment of her medication if needed. I will arrange her follow-up appointment after the PET scan and biopsy for discussion of her treatment options. The patient was advised to call immediately if she has any concerning symptoms in the interval. The patient voices understanding of current disease status and treatment options and is in agreement with the current care plan.  All questions were answered. The patient knows to call the clinic with any problems, questions or concerns. We can certainly see the patient much sooner if necessary.  Thank you so much for allowing me to participate in the care of [facility]. I will continue to follow up the patient with you and assist in her care.  I spent 40 minutes counseling the patient face to face. The  total time spent in the appointment was 60 minutes.  Disclaimer: This note was dictated with voice recognition software. Similar sounding words can inadvertently be transcribed and may not be corrected upon review.   Eilleen Kempf July 28, 2018, 10:21 AM

## 2018-07-28 NOTE — Telephone Encounter (Signed)
We will arrange follow-up visit after the PET scan and biopsy. Per 8/2 los

## 2018-08-01 ENCOUNTER — Other Ambulatory Visit: Payer: Self-pay | Admitting: Family Medicine

## 2018-08-01 ENCOUNTER — Telehealth: Payer: Self-pay | Admitting: *Deleted

## 2018-08-01 DIAGNOSIS — I10 Essential (primary) hypertension: Secondary | ICD-10-CM | POA: Diagnosis not present

## 2018-08-01 DIAGNOSIS — K219 Gastro-esophageal reflux disease without esophagitis: Secondary | ICD-10-CM | POA: Diagnosis not present

## 2018-08-01 DIAGNOSIS — C679 Malignant neoplasm of bladder, unspecified: Secondary | ICD-10-CM | POA: Diagnosis not present

## 2018-08-01 DIAGNOSIS — R9389 Abnormal findings on diagnostic imaging of other specified body structures: Secondary | ICD-10-CM

## 2018-08-01 DIAGNOSIS — C7951 Secondary malignant neoplasm of bone: Secondary | ICD-10-CM | POA: Diagnosis not present

## 2018-08-01 NOTE — Telephone Encounter (Signed)
Discussed with Dr. Carlean Purl timing of colonoscopy at this time. Dr. Carlean Purl is going to reach out to Dr. Earlie Server to discuss and will let [facility] RN or myself know how to proceed.

## 2018-08-02 NOTE — Telephone Encounter (Signed)
Dr. Julien Nordmann recommends holding off on screening colonoscopy while oncology work-up proceeds.  Please contact the patient and let her know of his and my recommendations

## 2018-08-04 ENCOUNTER — Ambulatory Visit (HOSPITAL_COMMUNITY)
Admission: RE | Admit: 2018-08-04 | Discharge: 2018-08-04 | Disposition: A | Discharge status: Home / Self Care | Payer: Medicare Other | Source: Ambulatory Visit | Attending: Family Medicine | Admitting: Family Medicine

## 2018-08-04 DIAGNOSIS — C771 Secondary and unspecified malignant neoplasm of intrathoracic lymph nodes: Secondary | ICD-10-CM | POA: Diagnosis not present

## 2018-08-04 DIAGNOSIS — R911 Solitary pulmonary nodule: Secondary | ICD-10-CM | POA: Insufficient documentation

## 2018-08-04 DIAGNOSIS — C7951 Secondary malignant neoplasm of bone: Secondary | ICD-10-CM | POA: Diagnosis not present

## 2018-08-04 DIAGNOSIS — C679 Malignant neoplasm of bladder, unspecified: Secondary | ICD-10-CM | POA: Diagnosis not present

## 2018-08-04 LAB — GLUCOSE, CAPILLARY: Glucose-Capillary: 132 mg/dL — ABNORMAL HIGH (ref 70–99)

## 2018-08-04 MED ORDER — FLUDEOXYGLUCOSE F - 18 (FDG) INJECTION
8.6000 | Freq: Once | INTRAVENOUS | Status: AC | PRN
  Administered 2018-08-04: 8.6 via INTRAVENOUS

## 2018-08-07 ENCOUNTER — Other Ambulatory Visit: Payer: Self-pay | Admitting: Internal Medicine

## 2018-08-07 DIAGNOSIS — C679 Malignant neoplasm of bladder, unspecified: Secondary | ICD-10-CM

## 2018-08-09 ENCOUNTER — Telehealth: Payer: Self-pay | Admitting: Internal Medicine

## 2018-08-09 NOTE — Telephone Encounter (Signed)
Scheduled appt per 8/12 sch message - pt is aware of appt date and time.

## 2018-08-10 NOTE — Telephone Encounter (Signed)
Patient was called on 08/04/18 to let her know that we were cancelling her colonoscopy due to her oncology work up. Patient was relieved that the procedure was being cancelled. Patient states she has a lot going on and she didn't think that she could handle that procedure at this point and time.   Riki Sheer, LPN ( [facility] )

## 2018-08-11 ENCOUNTER — Other Ambulatory Visit: Payer: Self-pay | Admitting: Radiology

## 2018-08-14 ENCOUNTER — Telehealth: Payer: Self-pay | Admitting: Medical Oncology

## 2018-08-14 ENCOUNTER — Ambulatory Visit (HOSPITAL_COMMUNITY)
Admission: RE | Admit: 2018-08-14 | Discharge: 2018-08-14 | Disposition: A | Discharge status: Home / Self Care | Payer: Medicare Other | Source: Ambulatory Visit | Attending: Internal Medicine | Admitting: Internal Medicine

## 2018-08-14 ENCOUNTER — Other Ambulatory Visit: Payer: Self-pay | Admitting: Medical Oncology

## 2018-08-14 ENCOUNTER — Encounter (HOSPITAL_COMMUNITY): Payer: Self-pay

## 2018-08-14 ENCOUNTER — Other Ambulatory Visit: Payer: Self-pay

## 2018-08-14 DIAGNOSIS — C689 Malignant neoplasm of urinary organ, unspecified: Secondary | ICD-10-CM | POA: Diagnosis not present

## 2018-08-14 DIAGNOSIS — R590 Localized enlarged lymph nodes: Secondary | ICD-10-CM | POA: Diagnosis not present

## 2018-08-14 DIAGNOSIS — R310 Gross hematuria: Secondary | ICD-10-CM | POA: Diagnosis not present

## 2018-08-14 DIAGNOSIS — J90 Pleural effusion, not elsewhere classified: Secondary | ICD-10-CM | POA: Insufficient documentation

## 2018-08-14 DIAGNOSIS — C7951 Secondary malignant neoplasm of bone: Secondary | ICD-10-CM

## 2018-08-14 DIAGNOSIS — Z79899 Other long term (current) drug therapy: Secondary | ICD-10-CM | POA: Diagnosis not present

## 2018-08-14 DIAGNOSIS — I700 Atherosclerosis of aorta: Secondary | ICD-10-CM | POA: Diagnosis not present

## 2018-08-14 DIAGNOSIS — M899 Disorder of bone, unspecified: Secondary | ICD-10-CM | POA: Diagnosis present

## 2018-08-14 DIAGNOSIS — R918 Other nonspecific abnormal finding of lung field: Secondary | ICD-10-CM | POA: Diagnosis not present

## 2018-08-14 DIAGNOSIS — C771 Secondary and unspecified malignant neoplasm of intrathoracic lymph nodes: Secondary | ICD-10-CM | POA: Insufficient documentation

## 2018-08-14 DIAGNOSIS — R222 Localized swelling, mass and lump, trunk: Secondary | ICD-10-CM | POA: Diagnosis not present

## 2018-08-14 DIAGNOSIS — I10 Essential (primary) hypertension: Secondary | ICD-10-CM | POA: Diagnosis not present

## 2018-08-14 DIAGNOSIS — C679 Malignant neoplasm of bladder, unspecified: Secondary | ICD-10-CM | POA: Insufficient documentation

## 2018-08-14 LAB — CBC
HCT: 40.6 % (ref 36.0–46.0)
Hemoglobin: 13.4 g/dL (ref 12.0–15.0)
MCH: 27.3 pg (ref 26.0–34.0)
MCHC: 33 g/dL (ref 30.0–36.0)
MCV: 82.7 fL (ref 78.0–100.0)
PLATELETS: 309 10*3/uL (ref 150–400)
RBC: 4.91 MIL/uL (ref 3.87–5.11)
RDW: 12.9 % (ref 11.5–15.5)
WBC: 8.9 10*3/uL (ref 4.0–10.5)

## 2018-08-14 LAB — APTT: aPTT: 26 seconds (ref 24–36)

## 2018-08-14 LAB — PROTIME-INR
INR: 1.1
Prothrombin Time: 14.1 seconds (ref 11.4–15.2)

## 2018-08-14 MED ORDER — HYDROCODONE-ACETAMINOPHEN 5-325 MG PO TABS
1.0000 | ORAL_TABLET | ORAL | Status: DC | PRN
  Administered 2018-08-14: 1 via ORAL
  Filled 2018-08-14: qty 1

## 2018-08-14 MED ORDER — SODIUM CHLORIDE 0.9 % IV SOLN
INTRAVENOUS | Status: DC
  Administered 2018-08-14: 08:00:00 via INTRAVENOUS

## 2018-08-14 MED ORDER — MIDAZOLAM HCL 2 MG/2ML IJ SOLN
INTRAMUSCULAR | Status: AC
  Filled 2018-08-14: qty 4

## 2018-08-14 MED ORDER — FENTANYL CITRATE (PF) 100 MCG/2ML IJ SOLN
INTRAMUSCULAR | Status: AC
  Filled 2018-08-14: qty 4

## 2018-08-14 MED ORDER — FENTANYL CITRATE (PF) 100 MCG/2ML IJ SOLN
INTRAMUSCULAR | Status: AC | PRN
  Administered 2018-08-14 (×3): 50 ug via INTRAVENOUS

## 2018-08-14 MED ORDER — MIDAZOLAM HCL 2 MG/2ML IJ SOLN
INTRAMUSCULAR | Status: AC | PRN
  Administered 2018-08-14 (×3): 1 mg via INTRAVENOUS

## 2018-08-14 MED ORDER — LIDOCAINE HCL (PF) 1 % IJ SOLN
INTRAMUSCULAR | Status: AC | PRN
  Administered 2018-08-14 (×2): 10 mL

## 2018-08-14 MED ORDER — OXYCODONE-ACETAMINOPHEN 5-325 MG PO TABS: 1.0000 | ORAL_TABLET | Freq: Three times a day (TID) | ORAL | 0 refills | Status: AC | PRN

## 2018-08-14 NOTE — Telephone Encounter (Signed)
Pain left back and under left breast . She did not tolerated tramadol > vomiting. IR gave her vicodin , 1 tab today.

## 2018-08-14 NOTE — H&P (Signed)
Chief Complaint: Patient was seen in consultation today for image guided T5/T7 lytic lesion/posterior element biopsy  Referring Physician(s): Mohamed,Mohamed  Supervising Physician: Aletta Edouard  Patient Status: [facility] - Out-pt  History of Present Illness: Faith [name] is a 67 y.o. female with past medical history of bladder carcinoma 2018 and recent PET scan revealing widespread hypermetabolic skeletal metastases , mediastinal nodal metastases as well as lingula and inferior right lower lobe lung nodules.  She presents today for image guided T5/T7 lytic lesion biopsy for further evaluation.  Past Medical History:  Diagnosis Date  . Anemia due to acute blood loss 11/24/2016   TRANSFUSED PRBC X2 UNITS 11-25-2016  . Bladder neoplasm   . Cancer ([place])   . Constipation   . Gross hematuria   . Hypertension   . Urgency of urination     Past Surgical History:  Procedure Laterality Date  . CYSTOSCOPY W/ URETERAL STENT PLACEMENT Right 12/28/2016   Procedure: CYSTOSCOPY WITH RIGHT RETROGRADE URETERAL STENT PLACEMENT;  Surgeon: Festus Aloe, MD;  Location: [facility];  Service: Urology;  Laterality: Right;  . TRANSURETHRAL RESECTION OF BLADDER TUMOR WITH MITOMYCIN-C N/A 12/28/2016   Procedure: TRANSURETHRAL RESECTION OF BLADDER TUMOR GREATER THAN 5 CM;  Surgeon: Festus Aloe, MD;  Location: [facility];  Service: Urology;  Laterality: N/A;  . TUBAL LIGATION Bilateral 1990's    Allergies: Patient has no known allergies.  Medications: Prior to Admission medications   Medication Sig Start Date End Date Taking? Authorizing Provider  amLODipine (NORVASC) 10 MG tablet Take 1 tablet (10 mg total) by mouth every morning. 03/02/17  Yes Robyn Haber, MD  metoprolol succinate (TOPROL-XL) 100 MG 24 hr tablet Take 100 mg by mouth daily. 07/04/18  Yes [provider]  olmesartan (BENICAR) 40 MG tablet Take 40 mg by mouth daily. 05/19/18  Yes  [provider]  omeprazole (PRILOSEC) 40 MG capsule Take 40 mg by mouth daily. 07/03/18  Yes [provider]  traMADol (ULTRAM) 50 MG tablet Take 1 tablet (50 mg total) by mouth every 6 (six) hours as needed. 12/28/16  Yes Festus Aloe, MD  ferrous sulfate 325 (65 FE) MG tablet Start taking only after constipation is relieved. Take 1 tablet once daily for 1 week, then 1 tablet twice daily for 1 week and then 1 tablet three times daily. Patient taking differently: Take 325 mg by mouth. Start taking only after constipation is relieved. Take 1 tablet once daily for 1 week, then 1 tablet twice daily for 1 week and then 1 tablet three times daily. 11/26/16   Bonnielee Haff, MD  montelukast (SINGULAIR) 10 MG tablet Take 10 mg by mouth daily. 07/14/18   [provider]  polyethylene glycol (MIRALAX / GLYCOLAX) packet Take 17 g by mouth every other day. Patient not taking: Reported on 07/28/2018 03/02/17   Robyn Haber, MD  rosuvastatin (CRESTOR) 5 MG tablet Take 5 mg by mouth daily. 07/05/18   [provider]  senna-docusate (SENOKOT-S) 8.6-50 MG tablet Take 1 tablet by mouth daily as needed. 03/02/17   Robyn Haber, MD     Family History  Problem Relation Age of Onset  . Lung cancer Father     Social History   Socioeconomic History  . Marital status: Widowed    Spouse name: Not on file  . Number of children: Not on file  . Years of education: Not on file  . Highest education level: Not on file  Occupational History  .  Not on file  Social Needs  . Financial resource strain: Not on file  . Food insecurity:    Worry: Not on file    Inability: Not on file  . Transportation needs:    Medical: Not on file    Non-medical: Not on file  Tobacco Use  . Smoking status: Never Smoker  . Smokeless tobacco: Never Used  Substance and Sexual Activity  . Alcohol use: No  . Drug use: No  . Sexual activity: Never  Lifestyle  . Physical activity:    Days per  week: Not on file    Minutes per session: Not on file  . Stress: Not on file  Relationships  . Social connections:    Talks on phone: Not on file    Gets together: Not on file    Attends religious service: Not on file    Active member of club or organization: Not on file    Attends meetings of clubs or organizations: Not on file    Relationship status: Not on file  Other Topics Concern  . Not on file  Social History Narrative  . Not on file      Review of Systems denies fever, headache, dyspnea, cough, abdominal pain, vomiting or abnormal bleeding.  She does have left-sided chest discomfort, back pain and occasional nausea.   Vital Signs: BP (!) 179/84   Pulse 66   Temp 98.5 F (36.9 C) (Oral)   Resp 20   Ht 5\' 1"  (1.549 m)   Wt 166 lb (75.3 kg)   SpO2 100%   BMI 31.37 kg/m   Physical Exam awake, alert.  Chest clear to auscultation bilaterally.  Heart with regular rate and rhythm.  Abdomen soft, positive bowel sounds, nontender.  No lower extremity edema  Imaging: Ct Abdomen Pelvis W Wo Contrast  Addendum Date: 07/21/2018   ADDENDUM REPORT: 07/21/2018 13:39 ADDENDUM: Additional findings of skeletal metastasis: Lytic lesion within the LEFT transverse process of the L2 vertebral body (image 23/2). This may explain patient's LEFT upper quadrant pain. This lesion is expansile and erodes the cortex. Additional lytic lesion in the superior endplate of the L1 vertebral body (image 64/5.) Small nodule in the inferior lingula measuring 6 mm also concerning for pulmonary metastasis. Recommend urology/oncology consultation for bladder cancer recurrence. Consider FDG PET scan for biopsy planning and re staging. Findings conveyed toELIZABETH [name] on 07/21/2018  at13:38. Electronically Signed   By: Suzy Bouchard M.D.   On: 07/21/2018 13:39   Result Date: 07/21/2018 CLINICAL DATA:  LEFT flank pain and LEFT upper quadrant pain EXAM: CT ABDOMEN AND PELVIS WITHOUT AND WITH CONTRAST  TECHNIQUE: Multidetector CT imaging of the abdomen and pelvis was performed following the standard protocol before and following the bolus administration of intravenous contrast. CONTRAST:  154mL ISOVUE-300 IOPAMIDOL (ISOVUE-300) INJECTION 61% Creatinine was obtained on site at [place] at [address] Results: Creatinine 1.2 mg/dL. COMPARISON:  CT 07/26/2017 FINDINGS: Lower chest: 15 mm nodule the RIGHT lung base (image 19/3) is new from prior. Band of atelectasis in the LEFT lower lobe is also new. Hepatobiliary: No focal hepatic lesion. No biliary duct dilatation. Gallbladder is normal. Common bile duct is normal. Pancreas: Pancreas is normal. No ductal dilatation. No pancreatic inflammation. Spleen: Normal spleen Adrenals/urinary tract: Adrenal glands are normal. No nephrolithiasis ureterolithiasis. Large nonenhancing cysts within the LEFT kidney measures 4.0 cm. Delayed imaging demonstrates no filling defect within the collecting system or bladder. Most inferior RIGHT aspect of the  bladder there is an indentation by external soft tissue seen on image 6867 of series 6 and image 9 of series 11. Comparison exam from 07/26/2017 was calcifications in this region and similar indentation. Stomach/Bowel: Stomach, small bowel, appendix, and cecum are normal. The colon and rectosigmoid colon are normal. Vascular/Lymphatic: Abdominal aorta is normal caliber with atherosclerotic calcification. There is no retroperitoneal or periportal lymphadenopathy. No pelvic lymphadenopathy. Reproductive: Uterus and ovaries normal. Other: No free fluid. Musculoskeletal: Rounded sclerotic lesion within the L4 vertebral body measuring 8 mm (image 61/5) is new from 07/26/2017 potential lesion in the superior anterior endplate region of the L4 vertebral body (image 26/6 small sclerotic lesion in the RIGHT sacral ala measuring 4 mm is also new. In IMPRESSION: 1. Several new sclerotic lesions within the lumbar spine and  sacrum are highly concerning for skeletal metastasis. IF concern for bladder cancer metastatic disease, consider FDG PET scan further evaluation. 2. New rounded nodule at the LEFT lung base. In light of the new presumed skeletal metastasis this finding is concerning. Recommend FDG PET scan as above or at minimum a contrast chest CT. 3. No evidence of local bladder cancer recurrence. Indentation at the most inferior RIGHT aspect the bladder may relate to prior surgery. These results will be called to the ordering clinician or representative by the Radiologist Assistant, and communication documented in the PACS or zVision Dashboard. Electronically Signed: By: Suzy Bouchard M.D. On: 07/21/2018 11:38   Dg Chest 2 View  Result Date: 07/17/2018 CLINICAL DATA:  Anterior left ribcage pain deep to the breast for the past month possibly cause by lifting cases of water. EXAM: CHEST - 2 VIEW COMPARISON:  None in PACs FINDINGS: The lungs are subjectively adequately inflated. There is hazy increased density in the lingula. There is no pleural effusion. The heart and pulmonary vascularity are normal. There is calcification in the wall of the aortic arch. The observed bony thorax exhibits no acute abnormality. IMPRESSION: Patchy increased density in the lingula and anterior aspect of the left lower lobe may reflect atelectasis or pneumonia. Followup PA and lateral chest X-ray is recommended in 3-4 weeks following trial of antibiotic therapy to ensure resolution and exclude underlying malignancy. Thoracic aortic atherosclerosis. Electronically Signed   By: David  Martinique M.D.   On: 07/17/2018 12:50   Dg Ribs Unilateral Left  Result Date: 07/17/2018 CLINICAL DATA:  Left ribcage pain for the past month likely secondary to lifting heavy cases of water. EXAM: LEFT RIBS - 2 VIEW COMPARISON:  Chest x-ray of today's date FINDINGS: Two views of the left ribs reveal no acute or healing fracture. There is no lytic or blastic lesion.  IMPRESSION: There is no acute abnormality of the visualized portions of the left ribs. Electronically Signed   By: David  Martinique M.D.   On: 07/17/2018 12:51   Ct Chest W Contrast  Result Date: 07/24/2018 CLINICAL DATA:  Abnormal lung findings on CT the abdomen and pelvis. Left upper quadrant left rib pain deep under left breast. EXAM: CT CHEST WITH CONTRAST TECHNIQUE: Multidetector CT imaging of the chest was performed during intravenous contrast administration. CONTRAST:  51mL ISOVUE-300 IOPAMIDOL (ISOVUE-300) INJECTION 61% COMPARISON:  None. FINDINGS: Cardiovascular: The heart is unremarkable. No obvious coronary artery calcifications. Mild atherosclerotic change in the thoracic aorta without aneurysm or dissection. The main pulmonary artery is normal. No central pulmonary arterial filling defects. Mediastinum/Nodes: The thyroid and esophagus are normal. The chest wall is normal. There is adenopathy in the mediastinum involving the  right hilum, prevascular space on series 2, image 42, the AP window, subcarinal region, and the right paratracheal chain. The prevascular space node measures 16 mm in short axis and demonstrates central low attenuation suggesting the possibility of a developing necrotic metastasis. There is a small left pleural effusion. No pericardial effusion. Lungs/Pleura: Central airways are normal. No pneumothorax. Opacity in the right apex on series 3, image 21 demonstrates somewhat nodular components. There is similar mild opacity in the medial right apex on series 3, image 20. There is a peripheral nodule in the anterior right lung on series 3, image 61 measuring 7.9 mm in greatest dimension. There is a nodule in the superior segment of the right lower lobe measuring 7.5 mm on image 46. There is a nodule in the medial right lung base measuring 1.7 cm on series 3, image 94. Immediately superior and adjacent to this nodule is another on image 90 measuring 9 mm. There is a tiny nodule in the  left upper lobe on series 3, image 43. A larger nodule is seen in the lingula on series 3, image 69 measuring 1.8 cm. Small lingular nodules are seen on images 88 and 97. Nodularity in the left base is seen on series 3, image 68 and image 79 opacity in the left base has increased in the interval, likely atelectasis. There is also atelectasis in the lingula. Upper Abdomen: No acute abnormality. Musculoskeletal: Multiple bony metastases seen throughout the thoracic spine. There is involvement of T5, T6, probably T8, and probably T4. The most extensive involvement is at T5 and T6 involving the posterior elements including the left transverse processes and pedicles. There is also involvement of the right T5 pedicle and posterior elements including the spinous process. There appears to be involvement of the medial left fifth and sixth ribs. There is possible minimal involvement of the T5 transverse process. The canal remains patent. No definitive extension of metastatic disease into the canal although MRI would be more sensitive. IMPRESSION: 1. Bony metastatic disease involving the thoracic spine and 2 left medial ribs as above. There is significant lytic involvement of the posterior elements of T6 and T5. The findings are most marked at T6. There is no definitive extension into the canal but MRI would be more sensitive. 2. Adenopathy in the mediastinum is consistent with metastatic disease. The low-attenuation centrally suggests necrotic nodes. 3. Nodularity in the lungs as above. Most of the nodules likely represent metastatic disease. The clustered nodularity in the right apex could be infectious or metastatic. 4. Small left pleural effusion. 5. Atherosclerotic changes in the thoracic aorta. These results will be called to the ordering clinician or representative by the Radiologist Assistant, and communication documented in the PACS or zVision Dashboard. Aortic Atherosclerosis (ICD10-I70.0). Electronically Signed    By: Dorise Bullion III M.D   On: 07/24/2018 16:04   Nm Pet Image Restag (ps) Skull Base To Thigh  Result Date: 08/04/2018 CLINICAL DATA:  Subsequent treatment strategy for bladder carcinoma. EXAM: NUCLEAR MEDICINE PET SKULL BASE TO THIGH TECHNIQUE: 8.6 mCi F-18 FDG was injected intravenously. Full-ring PET imaging was performed from the skull base to thigh after the radiotracer. CT data was obtained and used for attenuation correction and anatomic localization. Fasting blood glucose: 132 mg/dl COMPARISON:  CT 07/21/2018 FINDINGS: Mediastinal blood pool activity: SUV max 8.6 NECK: No hypermetabolic lymph nodes in the neck. Incidental CT findings: none CHEST: Hypermetabolic subcarinal, RIGHT paratracheal prevascular and RIGHT hilar lymph nodes. Example subcarinal nodes with SUV max  equal 11.9 and measuring 14 mm short axis. Similar intense activity within RIGHT paratracheal nodes (SUV max equal 9.9). No supraclavicular adenopathy. Incidental CT findings: Atelectasis and bands of nodular thickening in the LEFT lower lobe do not have focal metabolic activity. However, nodule in the lingula measuring 15 mm (image 70/4) does have intense focal activity with SUV max equal 5.6. Hypermetabolic nodule in the most inferior RIGHT lung base is also suspicious for metastasis measures 16 mm (image 9/4) with SUV max equal 4.3. ABDOMEN/PELVIS: No abnormal hypermetabolic activity within the liver, pancreas, adrenal glands, or spleen. No hypermetabolic lymph nodes in the abdomen or pelvis. Bladder not well evaluated due to radiotracer activity excreted in the urine. Incidental CT findings: none SKELETON: Multiple sites intensely hypermetabolic skeletal metastasis. Example lesion in the LEFT aspect the manubrium with SUV max equal 13 corresponds to lytic lesion on CT image 54/4. Multiple lesion involving the vertebral bodies and posterior elements of the T5-T6 and T7 vertebral bodies. There is lytic destruction of the transverse  processes and neural arch at this level (image 63/4). Activity is intense with SUV max equal 11.5. Additional lesions in the L1, L2 and L3 vertebral bodies. Lesion in the LEFT iliac bone along the SI joint. Incidental CT findings: none IMPRESSION: 1. Widespread intensely hypermetabolic skeletal metastasis involving multiple levels of the thoracic and lumbar spine including vertebral bodies and posterior elements. 2. Metastatic skeletal lesions also involve the sternum and LEFT iliac bone, and LEFT scapula. 3. Intensely hypermetabolic mediastinal nodal metastasis 4. Hypermetabolic nodule in the lingula and inferior RIGHT lower lobe are most consistent with pulmonary metastasis. Electronically Signed   By: Suzy Bouchard M.D.   On: 08/04/2018 12:38    Labs:  CBC: Recent Labs    07/28/18 1005 08/14/18 0756  WBC 8.0 8.9  HGB 12.1 13.4  HCT 36.1 40.6  PLT 299 309    COAGS: Recent Labs    08/14/18 0756  INR 1.10  APTT 26    BMP: Recent Labs    07/28/18 1005  NA 140  K 4.0  CL 103  CO2 27  GLUCOSE 116*  BUN 18  CALCIUM 10.0  CREATININE 1.13*  GFRNONAA 50*  GFRAA 58*    LIVER FUNCTION TESTS: Recent Labs    07/28/18 1005  BILITOT 0.4  AST 13*  ALT 8  ALKPHOS 80  PROT 8.1  ALBUMIN 3.5    TUMOR MARKERS: No results for input(s): AFPTM, CEA, CA199, CHROMGRNA in the last 8760 hours.  Assessment and Plan: 67 y.o. female with past medical history of bladder carcinoma 2018 and recent PET scan revealing widespread hypermetabolic skeletal metastases , mediastinal nodal metastases as well as lingula and inferior right lower lobe lung nodules.  She presents today for image guided T5/T7 lytic lesion biopsy for further evaluation.Risks and benefits discussed with the patient including, but not limited to bleeding, infection, damage to adjacent structures or low yield requiring additional tests.  All of the patient's questions were answered, patient is agreeable to  proceed. Consent signed and in chart.     Thank you for this interesting consult.  I greatly enjoyed meeting Faith [name] and look forward to participating in their care.  A copy of this report was sent to the requesting provider on this date.  Electronically Signed: D. Rowe Robert, PA-C 08/14/2018, 8:24 AM   I spent a total of 25 minutes    in face to face in clinical consultation, greater than 50% of which was  counseling/coordinating care for image guided T5/T7 lytic lesion biopsy

## 2018-08-14 NOTE — Procedures (Signed)
Interventional Radiology Procedure Note  Procedure: CT guided core biopsy of thoracic spine lesion  Complications: Mild paraspinous/intercostal hemorrhage  Estimated Blood Loss: 10 mL  Findings: Destructive lesion involving posterior elements of T5-T7 level sampled from left approach.  18 G core biopsy x 3 via 17 G needle. Some hemorrhage during biopsy, treated with Gelfoam pledgets.  Venetia Night. Kathlene Cote, M.D Pager:  [phone]

## 2018-08-14 NOTE — Discharge Instructions (Signed)

## 2018-08-17 ENCOUNTER — Telehealth: Payer: Self-pay | Admitting: Internal Medicine

## 2018-08-17 ENCOUNTER — Telehealth: Payer: Self-pay | Admitting: Medical Oncology

## 2018-08-17 NOTE — Telephone Encounter (Signed)
I called pt about appt change tomorrow. She has to find transportation.She will call back.

## 2018-08-17 NOTE — Telephone Encounter (Signed)
PT scheduled per 8/22 sch message. PT aware of d&t.

## 2018-08-18 ENCOUNTER — Inpatient Hospital Stay (HOSPITAL_BASED_OUTPATIENT_CLINIC_OR_DEPARTMENT_OTHER): Payer: Medicare Other | Admitting: Oncology

## 2018-08-18 ENCOUNTER — Telehealth: Payer: Self-pay

## 2018-08-18 ENCOUNTER — Encounter: Payer: Self-pay | Admitting: Radiation Oncology

## 2018-08-18 VITALS — BP 164/63 | HR 66 | Temp 98.1°F | Resp 17 | Ht 61.0 in | Wt 169.1 lb

## 2018-08-18 DIAGNOSIS — C7800 Secondary malignant neoplasm of unspecified lung: Secondary | ICD-10-CM | POA: Diagnosis not present

## 2018-08-18 DIAGNOSIS — I10 Essential (primary) hypertension: Secondary | ICD-10-CM

## 2018-08-18 DIAGNOSIS — C679 Malignant neoplasm of bladder, unspecified: Secondary | ICD-10-CM | POA: Diagnosis not present

## 2018-08-18 DIAGNOSIS — C781 Secondary malignant neoplasm of mediastinum: Secondary | ICD-10-CM

## 2018-08-18 DIAGNOSIS — Z7189 Other specified counseling: Secondary | ICD-10-CM | POA: Insufficient documentation

## 2018-08-18 DIAGNOSIS — M549 Dorsalgia, unspecified: Secondary | ICD-10-CM

## 2018-08-18 DIAGNOSIS — C7951 Secondary malignant neoplasm of bone: Secondary | ICD-10-CM | POA: Diagnosis not present

## 2018-08-18 DIAGNOSIS — Z79899 Other long term (current) drug therapy: Secondary | ICD-10-CM

## 2018-08-18 MED ORDER — LIDOCAINE-PRILOCAINE 2.5-2.5 % EX CREA: 1.0000 "application " | TOPICAL_CREAM | CUTANEOUS | 0 refills | Status: AC | PRN

## 2018-08-18 MED ORDER — PROCHLORPERAZINE MALEATE 10 MG PO TABS: 10.0000 mg | ORAL_TABLET | Freq: Four times a day (QID) | ORAL | 0 refills | Status: AC | PRN

## 2018-08-18 NOTE — Telephone Encounter (Signed)
Printed avs and calender of upcoming appointment. Need to add [facility] appointment on 9/26. Printed avs and calender of upcoming appointments. Per 8/23 los

## 2018-08-18 NOTE — Telephone Encounter (Signed)
Spoke with patient concerning her appointment moveing to 8:30 am due to adding a appointment with  [facility] confirmation of time. Patient was aware that the appointment time could possibly change . Per 8/23 los

## 2018-08-18 NOTE — Progress Notes (Signed)
Reason for the request: Bladder cancer  HPI: I was asked by Dr. Junious Silk to evaluate Faith [name] for diagnosis of advanced bladder cancer.  She is a pleasant 67 year old woman with history of superficial bladder tumor diagnosed in in January 2018.  At that time she had transurethral resection of a bladder tumor in the pathology revealed invasive high-grade urothelial carcinoma without any muscle invasion.  She had a repeat cystoscopy in February 2018 as well as in August 2018 without any evidence of recurrent disease.  She started developing a symptoms of back pain as well as shortness of breath and a CT scan of the abdomen and pelvis obtained on 07/21/2018 showed multiple sclerotic lesions lumbar spine and the sacrum there is concern for skeletal metastasis.  CT scan of the chest on 07/24/2018 showed thoracic spine metastasis as well as left medial ribs.  There is a significant lesion noted T6 and T5.  Adenopathy noted in the mediastinum as well.  CT scan obtained on 08/04/2018 showed hypermetabolic skeletal involvement in the thoracic, lumbar spine as well as vertebral body, sternum, iliac bone and scapula.  Mediastinal nodal metastasis is also detected.  She has also involvement in the lingula and inferior right lower lobe consistent with pulmonary metastasis. Biopsy obtained on 08/14/2018 of the thoracic paraspinal tumor showed advanced urothelial carcinoma arising from a bladder primary.  Clinically, she reports midthoracic back pain as well as left-sided rib cage pain under her breast.  She does not take any pain medication except for Tylenol which is managing her pain minimally.  She has been nauseated at times but still eating reasonably well.  She has been working part-time but has stopped in the last 2 months.  She does not report any headaches, blurry vision, syncope or seizures. Does not report any fevers, chills or sweats.  Does not report any cough, wheezing or hemoptysis.  Does not report any chest  pain, palpitation, orthopnea or leg edema.  Does not report any nausea, vomiting or abdominal pain.  Does not report any constipation or diarrhea.  Does not report any skeletal complaints.    Does not report frequency, urgency or hematuria.  Does not report any skin rashes or lesions. Does not report any heat or cold intolerance.  Does not report any lymphadenopathy or petechiae.  Does not report any anxiety or depression.  Remaining review of systems is negative.    Past Medical History:  Diagnosis Date  . Anemia due to acute blood loss 11/24/2016   TRANSFUSED PRBC X2 UNITS 11-25-2016  . Bladder neoplasm   . Cancer ([place])   . Constipation   . Gross hematuria   . Hypertension   . Urgency of urination   :  Past Surgical History:  Procedure Laterality Date  . CYSTOSCOPY W/ URETERAL STENT PLACEMENT Right 12/28/2016   Procedure: CYSTOSCOPY WITH RIGHT RETROGRADE URETERAL STENT PLACEMENT;  Surgeon: Festus Aloe, MD;  [facility]  Service: Urology;  Laterality: Right;  . TRANSURETHRAL RESECTION OF BLADDER TUMOR WITH MITOMYCIN-C N/A 12/28/2016   Procedure: TRANSURETHRAL RESECTION OF BLADDER TUMOR GREATER THAN 5 CM;  Surgeon: Festus Aloe, MD;  Location: [facility];  Service: Urology;  Laterality: N/A;  . TUBAL LIGATION Bilateral 1990's  :   Current Outpatient Medications:  .  amLODipine (NORVASC) 10 MG tablet, Take 1 tablet (10 mg total) by mouth every morning., Disp: 90 tablet, Rfl: 0 .  ferrous sulfate 325 (65 FE) MG tablet, Start taking only after constipation is  relieved. Take 1 tablet once daily for 1 week, then 1 tablet twice daily for 1 week and then 1 tablet three times daily. (Patient taking differently: Take 325 mg by mouth. Start taking only after constipation is relieved. Take 1 tablet once daily for 1 week, then 1 tablet twice daily for 1 week and then 1 tablet three times daily.), Disp: 90 tablet, Rfl: 3 .  metoprolol succinate  (TOPROL-XL) 100 MG 24 hr tablet, Take 100 mg by mouth daily., Disp: , Rfl: 1 .  montelukast (SINGULAIR) 10 MG tablet, Take 10 mg by mouth daily., Disp: , Rfl: 3 .  olmesartan (BENICAR) 40 MG tablet, Take 40 mg by mouth daily., Disp: , Rfl: 0 .  omeprazole (PRILOSEC) 40 MG capsule, Take 40 mg by mouth daily., Disp: , Rfl: 1 .  oxyCODONE-acetaminophen (PERCOCET/ROXICET) 5-325 MG tablet, Take 1 tablet by mouth every 8 (eight) hours as needed for up to 5 days for severe pain., Disp: 15 tablet, Rfl: 0 .  polyethylene glycol (MIRALAX / GLYCOLAX) packet, Take 17 g by mouth every other day. (Patient not taking: Reported on 07/28/2018), Disp: 30 each, Rfl: 2 .  rosuvastatin (CRESTOR) 5 MG tablet, Take 5 mg by mouth daily., Disp: , Rfl: 3 .  senna-docusate (SENOKOT-S) 8.6-50 MG tablet, Take 1 tablet by mouth daily as needed., Disp: 30 tablet, Rfl: 2 .  traMADol (ULTRAM) 50 MG tablet, Take 1 tablet (50 mg total) by mouth every 6 (six) hours as needed., Disp: 30 tablet, Rfl: 0:  Allergies  Allergen Reactions  . Tramadol Nausea And Vomiting  :  Family History  Problem Relation Age of Onset  . Lung cancer Father   :  Social History   Socioeconomic History  . Marital status: Widowed    Spouse name: Not on file  . Number of children: Not on file  . Years of education: Not on file  . Highest education level: Not on file  Occupational History  . Not on file  Social Needs  . Financial resource strain: Not on file  . Food insecurity:    Worry: Not on file    Inability: Not on file  . Transportation needs:    Medical: Not on file    Non-medical: Not on file  Tobacco Use  . Smoking status: Never Smoker  . Smokeless tobacco: Never Used  Substance and Sexual Activity  . Alcohol use: No  . Drug use: No  . Sexual activity: Never  Lifestyle  . Physical activity:    Days per week: Not on file    Minutes per session: Not on file  . Stress: Not on file  Relationships  . Social connections:     Talks on phone: Not on file    Gets together: Not on file    Attends religious service: Not on file    Active member of club or organization: Not on file    Attends meetings of clubs or organizations: Not on file    Relationship status: Not on file  . Intimate partner violence:    Fear of current or ex partner: Not on file    Emotionally abused: Not on file    Physically abused: Not on file    Forced sexual activity: Not on file  Other Topics Concern  . Not on file  Social History Narrative  . Not on file  :  Pertinent items are noted in HPI.  Exam: Blood pressure (!) 164/63, pulse 66, temperature 98.1 F (36.7  C), temperature source Oral, resp. rate 17, height 5\' 1"  (1.549 m), weight 169 lb 1.6 oz (76.7 kg), SpO2 97 %.   ECOG 0 General appearance: alert and cooperative appeared without distress. Head: atraumatic without any abnormalities. Eyes: conjunctivae/corneas clear. PERRL.  Sclera anicteric. Throat: lips, mucosa, and tongue normal; without oral thrush or ulcers. Resp: clear to auscultation bilaterally without rhonchi, wheezes or dullness to percussion. Cardio: regular rate and rhythm, S1, S2 normal, no murmur, click, rub or gallop GI: soft, non-tender; bowel sounds normal; no masses,  no organomegaly Skin: Skin color, texture, turgor normal. No rashes or lesions Lymph nodes: Cervical, supraclavicular, and axillary nodes normal. Neurologic: Grossly normal without any motor, sensory or deep tendon reflexes. Musculoskeletal: No joint deformity or effusion.  CBC    Component Value Date/Time   WBC 8.9 08/14/2018 0756   RBC 4.91 08/14/2018 0756   HGB 13.4 08/14/2018 0756   HGB 12.1 07/28/2018 1005   HCT 40.6 08/14/2018 0756   PLT 309 08/14/2018 0756   PLT 299 07/28/2018 1005   MCV 82.7 08/14/2018 0756   MCH 27.3 08/14/2018 0756   MCHC 33.0 08/14/2018 0756   RDW 12.9 08/14/2018 0756   LYMPHSABS 1.1 07/28/2018 1005   MONOABS 0.5 07/28/2018 1005   EOSABS 0.2  07/28/2018 1005   BASOSABS 0.1 07/28/2018 1005     Chemistry      Component Value Date/Time   NA 140 07/28/2018 1005   K 4.0 07/28/2018 1005   CL 103 07/28/2018 1005   CO2 27 07/28/2018 1005   BUN 18 07/28/2018 1005   CREATININE 1.13 (H) 07/28/2018 1005      Component Value Date/Time   CALCIUM 10.0 07/28/2018 1005   ALKPHOS 80 07/28/2018 1005   AST 13 (L) 07/28/2018 1005   ALT 8 07/28/2018 1005   BILITOT 0.4 07/28/2018 1005      Ct Abdomen Pelvis W Wo Contrast  Addendum Date: 07/21/2018   ADDENDUM REPORT: 07/21/2018 13:39 ADDENDUM: Additional findings of skeletal metastasis: Lytic lesion within the LEFT transverse process of the L2 vertebral body (image 23/2). This may explain patient's LEFT upper quadrant pain. This lesion is expansile and erodes the cortex. Additional lytic lesion in the superior endplate of the L1 vertebral body (image 64/5.) Small nodule in the inferior lingula measuring 6 mm also concerning for pulmonary metastasis. Recommend urology/oncology consultation for bladder cancer recurrence. Consider FDG PET scan for biopsy planning and re staging. Findings conveyed toELIZABETH DEWEY on 07/21/2018  at13:38. Electronically Signed   By: Suzy Bouchard M.D.   On: 07/21/2018 13:39   Result Date: 07/21/2018 CLINICAL DATA:  LEFT flank pain and LEFT upper quadrant pain EXAM: CT ABDOMEN AND PELVIS WITHOUT AND WITH CONTRAST TECHNIQUE: Multidetector CT imaging of the abdomen and pelvis was performed following the standard protocol before and following the bolus administration of intravenous contrast. CONTRAST:  140mL ISOVUE-300 IOPAMIDOL (ISOVUE-300) INJECTION 61% Creatinine was obtained on site at [place] at [address] Results: Creatinine 1.2 mg/dL. COMPARISON:  CT 07/26/2017 FINDINGS: Lower chest: 15 mm nodule the RIGHT lung base (image 19/3) is new from prior. Band of atelectasis in the LEFT lower lobe is also new. Hepatobiliary: No focal hepatic lesion. No  biliary duct dilatation. Gallbladder is normal. Common bile duct is normal. Pancreas: Pancreas is normal. No ductal dilatation. No pancreatic inflammation. Spleen: Normal spleen Adrenals/urinary tract: Adrenal glands are normal. No nephrolithiasis ureterolithiasis. Large nonenhancing cysts within the LEFT kidney measures 4.0 cm. Delayed imaging demonstrates no  filling defect within the collecting system or bladder. Most inferior RIGHT aspect of the bladder there is an indentation by external soft tissue seen on image 6867 of series 6 and image 9 of series 11. Comparison exam from 07/26/2017 was calcifications in this region and similar indentation. Stomach/Bowel: Stomach, small bowel, appendix, and cecum are normal. The colon and rectosigmoid colon are normal. Vascular/Lymphatic: Abdominal aorta is normal caliber with atherosclerotic calcification. There is no retroperitoneal or periportal lymphadenopathy. No pelvic lymphadenopathy. Reproductive: Uterus and ovaries normal. Other: No free fluid. Musculoskeletal: Rounded sclerotic lesion within the L4 vertebral body measuring 8 mm (image 61/5) is new from 07/26/2017 potential lesion in the superior anterior endplate region of the L4 vertebral body (image 26/6 small sclerotic lesion in the RIGHT sacral ala measuring 4 mm is also new. In IMPRESSION: 1. Several new sclerotic lesions within the lumbar spine and sacrum are highly concerning for skeletal metastasis. IF concern for bladder cancer metastatic disease, consider FDG PET scan further evaluation. 2. New rounded nodule at the LEFT lung base. In light of the new presumed skeletal metastasis this finding is concerning. Recommend FDG PET scan as above or at minimum a contrast chest CT. 3. No evidence of local bladder cancer recurrence. Indentation at the most inferior RIGHT aspect the bladder may relate to prior surgery. These results will be called to the ordering clinician or representative by the Radiologist  Assistant, and communication documented in the PACS or zVision Dashboard. Electronically Signed: By: Suzy Bouchard M.D. On: 07/21/2018 11:38   Ct Chest W Contrast  Result Date: 07/24/2018 CLINICAL DATA:  Abnormal lung findings on CT the abdomen and pelvis. Left upper quadrant left rib pain deep under left breast. EXAM: CT CHEST WITH CONTRAST TECHNIQUE: Multidetector CT imaging of the chest was performed during intravenous contrast administration. CONTRAST:  46mL ISOVUE-300 IOPAMIDOL (ISOVUE-300) INJECTION 61% COMPARISON:  None. FINDINGS: Cardiovascular: The heart is unremarkable. No obvious coronary artery calcifications. Mild atherosclerotic change in the thoracic aorta without aneurysm or dissection. The main pulmonary artery is normal. No central pulmonary arterial filling defects. Mediastinum/Nodes: The thyroid and esophagus are normal. The chest wall is normal. There is adenopathy in the mediastinum involving the right hilum, prevascular space on series 2, image 42, the AP window, subcarinal region, and the right paratracheal chain. The prevascular space node measures 16 mm in short axis and demonstrates central low attenuation suggesting the possibility of a developing necrotic metastasis. There is a small left pleural effusion. No pericardial effusion. Lungs/Pleura: Central airways are normal. No pneumothorax. Opacity in the right apex on series 3, image 21 demonstrates somewhat nodular components. There is similar mild opacity in the medial right apex on series 3, image 20. There is a peripheral nodule in the anterior right lung on series 3, image 61 measuring 7.9 mm in greatest dimension. There is a nodule in the superior segment of the right lower lobe measuring 7.5 mm on image 46. There is a nodule in the medial right lung base measuring 1.7 cm on series 3, image 94. Immediately superior and adjacent to this nodule is another on image 90 measuring 9 mm. There is a tiny nodule in the left upper lobe  on series 3, image 43. A larger nodule is seen in the lingula on series 3, image 69 measuring 1.8 cm. Small lingular nodules are seen on images 88 and 97. Nodularity in the left base is seen on series 3, image 68 and image 79 opacity in the left base  has increased in the interval, likely atelectasis. There is also atelectasis in the lingula. Upper Abdomen: No acute abnormality. Musculoskeletal: Multiple bony metastases seen throughout the thoracic spine. There is involvement of T5, T6, probably T8, and probably T4. The most extensive involvement is at T5 and T6 involving the posterior elements including the left transverse processes and pedicles. There is also involvement of the right T5 pedicle and posterior elements including the spinous process. There appears to be involvement of the medial left fifth and sixth ribs. There is possible minimal involvement of the T5 transverse process. The canal remains patent. No definitive extension of metastatic disease into the canal although MRI would be more sensitive. IMPRESSION: 1. Bony metastatic disease involving the thoracic spine and 2 left medial ribs as above. There is significant lytic involvement of the posterior elements of T6 and T5. The findings are most marked at T6. There is no definitive extension into the canal but MRI would be more sensitive. 2. Adenopathy in the mediastinum is consistent with metastatic disease. The low-attenuation centrally suggests necrotic nodes. 3. Nodularity in the lungs as above. Most of the nodules likely represent metastatic disease. The clustered nodularity in the right apex could be infectious or metastatic. 4. Small left pleural effusion. 5. Atherosclerotic changes in the thoracic aorta. These results will be called to the ordering clinician or representative by the Radiologist Assistant, and communication documented in the PACS or zVision Dashboard. Aortic Atherosclerosis (ICD10-I70.0). Electronically Signed   By: Dorise Bullion III M.D   On: 07/24/2018 16:04   Nm Pet Image Restag (ps) Skull Base To Thigh  Result Date: 08/04/2018 CLINICAL DATA:  Subsequent treatment strategy for bladder carcinoma. EXAM: NUCLEAR MEDICINE PET SKULL BASE TO THIGH TECHNIQUE: 8.6 mCi F-18 FDG was injected intravenously. Full-ring PET imaging was performed from the skull base to thigh after the radiotracer. CT data was obtained and used for attenuation correction and anatomic localization. Fasting blood glucose: 132 mg/dl COMPARISON:  CT 07/21/2018 FINDINGS: Mediastinal blood pool activity: SUV max 8.6 NECK: No hypermetabolic lymph nodes in the neck. Incidental CT findings: none CHEST: Hypermetabolic subcarinal, RIGHT paratracheal prevascular and RIGHT hilar lymph nodes. Example subcarinal nodes with SUV max equal 11.9 and measuring 14 mm short axis. Similar intense activity within RIGHT paratracheal nodes (SUV max equal 9.9). No supraclavicular adenopathy. Incidental CT findings: Atelectasis and bands of nodular thickening in the LEFT lower lobe do not have focal metabolic activity. However, nodule in the lingula measuring 15 mm (image 70/4) does have intense focal activity with SUV max equal 5.6. Hypermetabolic nodule in the most inferior RIGHT lung base is also suspicious for metastasis measures 16 mm (image 9/4) with SUV max equal 4.3. ABDOMEN/PELVIS: No abnormal hypermetabolic activity within the liver, pancreas, adrenal glands, or spleen. No hypermetabolic lymph nodes in the abdomen or pelvis. Bladder not well evaluated due to radiotracer activity excreted in the urine. Incidental CT findings: none SKELETON: Multiple sites intensely hypermetabolic skeletal metastasis. Example lesion in the LEFT aspect the manubrium with SUV max equal 13 corresponds to lytic lesion on CT image 54/4. Multiple lesion involving the vertebral bodies and posterior elements of the T5-T6 and T7 vertebral bodies. There is lytic destruction of the transverse processes and  neural arch at this level (image 63/4). Activity is intense with SUV max equal 11.5. Additional lesions in the L1, L2 and L3 vertebral bodies. Lesion in the LEFT iliac bone along the SI joint. Incidental CT findings: none IMPRESSION: 1. Widespread intensely hypermetabolic skeletal  metastasis involving multiple levels of the thoracic and lumbar spine including vertebral bodies and posterior elements. 2. Metastatic skeletal lesions also involve the sternum and LEFT iliac bone, and LEFT scapula. 3. Intensely hypermetabolic mediastinal nodal metastasis 4. Hypermetabolic nodule in the lingula and inferior RIGHT lower lobe are most consistent with pulmonary metastasis. Electronically Signed   By: Suzy Bouchard M.D.   On: 08/04/2018 12:38      Assessment and Plan:   67 year old woman with the following issues:  1.  Metastatic high-grade urothelial carcinoma arising from the bladder documented in August 2019.  He was diagnosed with non-muscle invasive disease in January 2018.  She has high-volume disease with metastatic disease to the bone, mediastinal adenopathy as well as lung involvement.  The natural course of her disease was personally reviewed today and imaging studies were personally reviewed and discussed with the patient.  She has clearly stage IV disease that is incurable.  Any treatment at this time is cared towards palliation and disease control.  She has excellent performance status and aggressive therapy is warranted.  Systemic chemotherapy has been effective and this tumor has been highly responsive to cisplatin based chemotherapy.  The rationale for using gemcitabine and cisplatin was discussed today in detail.  Complication associated with this medication include nausea, vomiting, myelosuppression, neutropenia, neutropenic sepsis and rarely severe illness and death.  Complication associated with cisplatin specifically include renal insufficiency, electrolyte imbalance and the need for  hydration.  Cytopenias associated with gemcitabine does require dose reductions at times potentially growth factor support and transfusion.  After discussion today, she is agreeable to proceed after chemotherapy education class.  She will receive 6 cycles of chemotherapy utilizing gemcitabine at 1000 mg/m day 1 and day 8 with cisplatin at 70 mg/m on day 1 out of 21-day cycle.  2.  IV access: Risks and benefits of a Port-A-Cath insertion was discussed today.  Complications including bleeding, infection and thrombosis.  He is agreeable to proceed at this time.  Number cream will be prescribed to her.  3.  Antiemetics: Prescription for Compazine will be prescribed to her with instructions how to use it.  4.  Thoracic spine metastasis: Her lesion could be critical and an increased risk of cord compression.  I will send her to radiation oncology for prophylactic treatment if felt reasonable.  5.  Pain: Manageable at this time with minimal analgesia.  Anticipate improvement in her pain levels after definitive treatment.  6.  Bone directed therapy: This will be addressed in the future after initiation of therapy.  She has poor dental hygiene and she will require extensive dental evaluation prior to consideration for Xgeva or Zometa.  7.  Prognosis and goals of care: Her performance status is excellent and aggressive therapy is warranted.  She still has a very aggressive and incurable malignancy but disease that can be palliated for an extended period of time.  She understands if she has a good response to systemic chemotherapy there is a reasonable chance that she will have extended disease-free survival.  But she also understands that limited response to chemotherapy carries a very poor prognosis.  8.  Follow-up: We will be in the immediate future to start systemic chemotherapy.  60  minutes was spent with the patient face-to-face today.  More than 50% of time was dedicated to patient counseling,  education and discussing the natural course of her disease, reviewing imaging studies and coordinating her multifaceted care.

## 2018-08-18 NOTE — Progress Notes (Signed)
START ON PATHWAY REGIMEN - Bladder     A cycle is every 21 days:     Gemcitabine      Cisplatin   **Always confirm dose/schedule in your pharmacy ordering system**  Patient Characteristics: Metastatic Disease, First Line, No Prior Neoadjuvant/Adjuvant Therapy, Good Renal Function (CrCl ? 50 mL/min) AJCC M Category: M1b AJCC N Category: NX AJCC T Category: TX Current evidence of distant metastases<= Yes AJCC 8 Stage Grouping: IVB Line of Therapy: First Line Prior Neoadjuvant/Adjuvant Therapy<= No Renal Function: Good Renal Function (CrCl ? 50 mL/min) Intent of Therapy: Non-Curative / Palliative Intent, Discussed with Patient

## 2018-08-22 ENCOUNTER — Inpatient Hospital Stay: Payer: Medicare Other

## 2018-08-22 ENCOUNTER — Encounter: Payer: BLUE CROSS/BLUE SHIELD | Admitting: Internal Medicine

## 2018-08-22 NOTE — Progress Notes (Signed)
PA for Lidocaine-Prilocaine 2.5-2.5% cream has been approved.

## 2018-08-23 ENCOUNTER — Telehealth: Payer: Self-pay | Admitting: *Deleted

## 2018-08-23 NOTE — Progress Notes (Signed)
GU Location of Tumor / Histology: Metastatic urothelial carcinoma  Faith [name] presented with midthoracic back pain, shortness of breath, left sided rib cage pain under her breast.  CT 0/06/1218: hypermetabolic skeletal involvement in the thoracic, lumbar spine as well as vertebral body, sternum, iliac bone and scapula.  Mediastinal nodal metastasis is also detected.  She has also involvement in the lingula and inferior right lower lobe consistent with pulmonary metastasis.   CT abdomen/pelvis 07/21/2018: multiple sclerotic lesions in the lumbar spine and sacrum, there is concern for skeletal metastasis.  CT Chest 07/24/2018: thoracic spine metastasis as well as left medial ribs.  There is significant lesion noted T6 and T5.  Adenopathy noted in the mediastinum as well.   Biopsies of Bone 08/14/2018    Past/Anticipated interventions by urology, if any:   Past/Anticipated interventions by medical oncology, if any:  Dr. Alen Blew 08/18/2018 -She has clearly stage IV disease that is incurable.  Any treatment is care towards palliation and disease control. -After discussion, she is agreeable to proceed.  She will receive 6 cycles of chemotherapy utilizing gemcitabine at at 1000 mg/m day 1 and day 8 with cisplatin at 70 mg/m on day 1 out of 21-day cycle. - Risks and benefits of a Port insertion was discussed.  She is agreeable to proceed. -Thoracic spine metastasis, her lesion could be critical and increased risk of cord compression.  I will send her to radiation oncology for prophylactic treatment if felt reasonable. -Her performance status is excellent and aggressive therapy is warranted.  She still has a very aggressive and incurable malignancy but disease that can be palliated for an extended period of time.  She understands if she has a good response to systemic chemotherapy there is a reasonable chance that she will have extended disease-free survival.  But she also understands that limited  response to chemotherapy carries a very poor prognosis.  -Chemo for 18 weeks, radiation  Weight changes, if any: No  Bowel/Bladder complaints, if any: Constipation- taking stools softeners, miralax., urgency increasing over the last few weeks.  Nausea/Vomiting, if any: N/V when she took tramadol pain medications.  Gets sick with types of pain medicines.  Pain issues, if any:  Pain that encircles her chest, under breast, around to her back.  BP (!) 156/78 (BP Location: Right Arm, Patient Position: Sitting)   Pulse (!) 58   Temp 98.7 F (37.1 C) (Oral)   Resp 20   Ht 5' 1.5" (1.562 m)   Wt 169 lb 6.4 oz (76.8 kg)   SpO2 98%   BMI 31.49 kg/m    Wt Readings from Last 3 Encounters:  08/24/18 169 lb 6.4 oz (76.8 kg)  08/18/18 169 lb 1.6 oz (76.7 kg)  08/14/18 166 lb (75.3 kg)    SAFETY ISSUES:  Prior radiation? No  Pacemaker/ICD? No  Possible current pregnancy? No  Is the patient on methotrexate? No  Current Complaints / other details:   -12/2016: cystoscopy with ureteral stent

## 2018-08-23 NOTE — Telephone Encounter (Signed)
Patient calling at 4:25 pm. States she has been nauseated all day and called [facility] phone today. Did not know to call desk nurse. States even though she took compazine she still got nauseated with the tramadol. States she has an appt here tomorrow in radiation and will discuss further with rad onc physician. Instructed her to get gatorade and aleve now, take a compazine and then the aleve.

## 2018-08-24 ENCOUNTER — Ambulatory Visit
Admission: RE | Admit: 2018-08-24 | Discharge: 2018-08-24 | Disposition: A | Discharge status: Home / Self Care | Payer: Medicare Other | Source: Ambulatory Visit | Attending: Radiation Oncology | Admitting: Radiation Oncology

## 2018-08-24 ENCOUNTER — Encounter: Payer: Self-pay | Admitting: General Practice

## 2018-08-24 ENCOUNTER — Other Ambulatory Visit: Payer: Self-pay

## 2018-08-24 ENCOUNTER — Encounter: Payer: Self-pay | Admitting: Radiation Oncology

## 2018-08-24 VITALS — BP 156/78 | HR 58 | Temp 98.7°F | Resp 20 | Ht 61.5 in | Wt 169.4 lb

## 2018-08-24 DIAGNOSIS — C7951 Secondary malignant neoplasm of bone: Secondary | ICD-10-CM | POA: Diagnosis not present

## 2018-08-24 DIAGNOSIS — M549 Dorsalgia, unspecified: Secondary | ICD-10-CM | POA: Insufficient documentation

## 2018-08-24 DIAGNOSIS — C771 Secondary and unspecified malignant neoplasm of intrathoracic lymph nodes: Secondary | ICD-10-CM | POA: Insufficient documentation

## 2018-08-24 DIAGNOSIS — Z888 Allergy status to other drugs, medicaments and biological substances status: Secondary | ICD-10-CM | POA: Insufficient documentation

## 2018-08-24 DIAGNOSIS — C679 Malignant neoplasm of bladder, unspecified: Secondary | ICD-10-CM | POA: Diagnosis not present

## 2018-08-24 DIAGNOSIS — G893 Neoplasm related pain (acute) (chronic): Secondary | ICD-10-CM | POA: Diagnosis not present

## 2018-08-24 DIAGNOSIS — Z79899 Other long term (current) drug therapy: Secondary | ICD-10-CM | POA: Diagnosis not present

## 2018-08-24 DIAGNOSIS — I10 Essential (primary) hypertension: Secondary | ICD-10-CM | POA: Diagnosis not present

## 2018-08-24 DIAGNOSIS — C7911 Secondary malignant neoplasm of bladder: Secondary | ICD-10-CM | POA: Insufficient documentation

## 2018-08-24 NOTE — Progress Notes (Signed)
Radiation Oncology         [phone] ________________________________  Name: Faith [name]        MRN: [id number]  Date of Service: 08/24/2018 DOB: [birth year]  PZ:WCHENID, No Pcp Per  Wyatt Portela, MD     REFERRING PHYSICIAN: Wyatt Portela, MD   DIAGNOSIS: The primary encounter diagnosis was Carcinoma of bladder metastatic to bone [facility]). A diagnosis of Urothelial carcinoma of bladder ([place]) was also pertinent to this visit.   HISTORY OF PRESENT ILLNESS: Faith [name] is a 67 y.o. female seen at the request of Dr. Alen Blew for a history of non muscle invasive high grade urothelial carcinoma with recurrent metastatic disease.  The patient was originally diagnosed in January 2018 after undergoing TURBT, and was found to have high-grade urothelial carcinoma but did not have any muscle invasive component.  She was followed locally with cystoscopy in February and August 2018 which were both negative.  She was asymptomatic until approximately a month ago when she developed increasing changes in back pain, multifocal but in the lower and mid back as well as some shortness of breath.  She underwent CT imaging on 07/21/2018 of the abdomen and pelvis which revealed a lytic lesion in the left transverse process of L2 vertebral body, lytic lesion in the L1 vertebral body, nodule in the inferior lingula measuring 6 mm, 15 mm nodule in the right lung base, left lower lobe atelectasis, and sclerotic lesion at the L2 vertebral body measuring 8 mm, and sclerotic lesion in the right sacral L Law measuring 4 mm.  She underwent CT of the chest with contrast on 07/24/2018 revealing adenopathy in the mediastinum including the right hilum, prevascular space, AP window, subcarinal and right paratracheal chains.  A prevascular node measuring 16 mm was also noted, and a small left effusion was identified.  Multiple subcentimeter nodules were noted in the lungs as well as a 1.7 cm right medial lung base mass, another  lesion measuring 1.8 cm in the lingula was noted, and she had multiple bony metastases seen throughout the thoracic spine including T5, 6, T8 and T4.  The most extensive involvement is at T5 and T6 involving the posterior elements as well as the left transverse processes and pedicles.  There was involvement of the medial left fifth and sixth ribs, and the canal was patent.  She did undergo a PET scan on 08/04/2018 which confirmed hypermetabolic disease in the subcarinal, right paratracheal, prevascular and right hilum, similar intensity was noted in the right paratracheal nodes, her atelectasis in the left lower lobe did not have any metabolic activity though the lingula nodule measuring 15 mm has intensity of 5.6 SUV, right lung base nodule at 16 mm also had an SUV of 4.3.  Multiple sites of hypermetabolism was seen in the skeleton including left aspect of the manubrium, T5-6 and T7 vertebral bodies as well as destruction of the transverse process and neural arch at that level with an SUV of 11.5, as well as L1-3 vertebral body lesions in the left iliac lesion along the SI joint.  The left scapula was also involved.  She underwent a biopsy of her T-spine on 08/14/2018 which revealed advanced urothelial carcinoma consistent with her primary bladder cancer.  She has met with Dr. Alen Blew and has plans to undergo chemotherapy administration beginning on 08/31/2018.  The regimen will be Gemzar cisplatin, and she is to have a Port-A-Cath placed on Wednesday next week.  She comes today to discuss  options of palliative radiotherapy to the thoracic spine.   PREVIOUS RADIATION THERAPY: No   PAST MEDICAL HISTORY:  Past Medical History:  Diagnosis Date  . Anemia due to acute blood loss 11/24/2016   TRANSFUSED PRBC X2 UNITS 11-25-2016  . Bladder neoplasm   . Cancer ([place])   . Constipation   . Gross hematuria   . Hypertension   . Urgency of urination        PAST SURGICAL HISTORY: Past Surgical History:  Procedure  Laterality Date  . CYSTOSCOPY W/ URETERAL STENT PLACEMENT Right 12/28/2016   Procedure: CYSTOSCOPY WITH RIGHT RETROGRADE URETERAL STENT PLACEMENT;  Surgeon: Festus Aloe, MD;  [facility];  Service: Urology;  Laterality: Right;  . TRANSURETHRAL RESECTION OF BLADDER TUMOR WITH MITOMYCIN-C N/A 12/28/2016   Procedure: TRANSURETHRAL RESECTION OF BLADDER TUMOR GREATER THAN 5 CM;  Surgeon: Festus Aloe, MD;  Location: [facility];  Service: Urology;  Laterality: N/A;  . TUBAL LIGATION Bilateral 1990's     FAMILY HISTORY:  Family History  Problem Relation Age of Onset  . Leukemia Father      SOCIAL HISTORY:  reports that she has never smoked. She has never used smokeless tobacco. She reports that she does not drink alcohol or use drugs.   ALLERGIES: Tramadol   MEDICATIONS:  Current Outpatient Medications  Medication Sig Dispense Refill  . amLODipine (NORVASC) 10 MG tablet Take 1 tablet (10 mg total) by mouth every morning. 90 tablet 0  . ferrous sulfate 325 (65 FE) MG tablet Start taking only after constipation is relieved. Take 1 tablet once daily for 1 week, then 1 tablet twice daily for 1 week and then 1 tablet three times daily. (Patient taking differently: Take 325 mg by mouth. Start taking only after constipation is relieved. Take 1 tablet once daily for 1 week, then 1 tablet twice daily for 1 week and then 1 tablet three times daily.) 90 tablet 3  . hydrochlorothiazide (HYDRODIURIL) 25 MG tablet TAKE 1 TABLET (25 MG) BY MOUTH ONE TIME DAILY  2  . lidocaine-prilocaine (EMLA) cream Apply 1 application topically as needed. 30 g 0  . metoprolol succinate (TOPROL-XL) 100 MG 24 hr tablet Take 100 mg by mouth daily.  1  . montelukast (SINGULAIR) 10 MG tablet Take 10 mg by mouth daily.  3  . olmesartan (BENICAR) 40 MG tablet Take 40 mg by mouth daily.  0  . omeprazole (PRILOSEC) 40 MG capsule Take 40 mg by mouth daily.  1  . prochlorperazine  (COMPAZINE) 10 MG tablet Take 1 tablet (10 mg total) by mouth every 6 (six) hours as needed for nausea or vomiting. 30 tablet 0  . rosuvastatin (CRESTOR) 5 MG tablet Take 5 mg by mouth daily.  3  . senna-docusate (SENOKOT-S) 8.6-50 MG tablet Take 1 tablet by mouth daily as needed. 30 tablet 2  . traMADol (ULTRAM) 50 MG tablet Take 1 tablet (50 mg total) by mouth every 6 (six) hours as needed. 30 tablet 0  . polyethylene glycol (MIRALAX / GLYCOLAX) packet Take 17 g by mouth every other day. (Patient not taking: Reported on 07/28/2018) 30 each 2   No current facility-administered medications for this encounter.      REVIEW OF SYSTEMS: On review of systems, the patient reports that she is doing okay but has had progressive pain in her mid back. She reports she has occasional low back pain but denies persistence of this like in her mid thoracic area.  She reports radiculopathy around her chest wall below her breasts bilaterally. She denies truncal numbness or weakness. She denies any chest pain, shortness of breath, cough, fevers, chills, night sweats, unintended weight changes. She denies any bladder disturbances, and denies abdominal pain, nausea or vomiting. She notes constipation, and is taking miralax. She denies any new musculoskeletal or joint aches or pains. A complete review of systems is obtained and is otherwise negative.     PHYSICAL EXAM:  Wt Readings from Last 3 Encounters:  08/24/18 169 lb 6.4 oz (76.8 kg)  08/18/18 169 lb 1.6 oz (76.7 kg)  08/14/18 166 lb (75.3 kg)   Temp Readings from Last 3 Encounters:  08/24/18 98.7 F (37.1 C) (Oral)  08/18/18 98.1 F (36.7 C) (Oral)  08/14/18 98.1 F (36.7 C) (Oral)   BP Readings from Last 3 Encounters:  08/24/18 (!) 156/78  08/18/18 (!) 164/63  08/14/18 (!) 159/78   Pulse Readings from Last 3 Encounters:  08/24/18 (!) 58  08/18/18 66  08/14/18 61   Pain Assessment Pain Score: 2  Pain Frequency: Occasional Pain Loc: (encircles  the chest area and back)/10  In general this is a well appearing African American female in no acute distress. She is alert and oriented x4 and appropriate throughout the examination. HEENT reveals that the patient is normocephalic, atraumatic. EOMs are intact. PERRLA. Skin is intact without any evidence of gross lesions. Cardiovascular exam reveals a regular rate and rhythm, no clicks rubs or murmurs are auscultated. Chest is clear to auscultation bilaterally. Lymphatic assessment is performed and does not reveal any adenopathy in the cervical or supraclavicular chains. Abdomen has active bowel sounds in all quadrants and is intact. The abdomen is soft, non tender, non distended. Lower extremities are negative for pretibial pitting edema, deep calf tenderness, cyanosis or clubbing.   ECOG = 1  0 - Asymptomatic (Fully active, able to carry on all predisease activities without restriction)  1 - Symptomatic but completely ambulatory (Restricted in physically strenuous activity but ambulatory and able to carry out work of a light or sedentary nature. For example, light housework, office work)  2 - Symptomatic, <50% in bed during the day (Ambulatory and capable of all self care but unable to carry out any work activities. Up and about more than 50% of waking hours)  3 - Symptomatic, >50% in bed, but not bedbound (Capable of only limited self-care, confined to bed or chair 50% or more of waking hours)  4 - Bedbound (Completely disabled. Cannot carry on any self-care. Totally confined to bed or chair)  5 - Death   Eustace Pen MM, Creech RH, Tormey DC, et al. [phone]). "Toxicity and response criteria of the [facility]". [place] Oncol. 5 (6): 649-55    LABORATORY DATA:  Lab Results  Component Value Date   WBC 8.9 08/14/2018   HGB 13.4 08/14/2018   HCT 40.6 08/14/2018   MCV 82.7 08/14/2018   PLT 309 08/14/2018   Lab Results  Component Value Date   NA 140 07/28/2018   K  4.0 07/28/2018   CL 103 07/28/2018   CO2 27 07/28/2018   Lab Results  Component Value Date   ALT 8 07/28/2018   AST 13 (L) 07/28/2018   ALKPHOS 80 07/28/2018   BILITOT 0.4 07/28/2018      RADIOGRAPHY: Nm Pet Image Restag (ps) Skull Base To Thigh  Result Date: 08/04/2018 CLINICAL DATA:  Subsequent treatment strategy for bladder carcinoma. EXAM: NUCLEAR MEDICINE PET  SKULL BASE TO THIGH TECHNIQUE: 8.6 mCi F-18 FDG was injected intravenously. Full-ring PET imaging was performed from the skull base to thigh after the radiotracer. CT data was obtained and used for attenuation correction and anatomic localization. Fasting blood glucose: 132 mg/dl COMPARISON:  CT 07/21/2018 FINDINGS: Mediastinal blood pool activity: SUV max 8.6 NECK: No hypermetabolic lymph nodes in the neck. Incidental CT findings: none CHEST: Hypermetabolic subcarinal, RIGHT paratracheal prevascular and RIGHT hilar lymph nodes. Example subcarinal nodes with SUV max equal 11.9 and measuring 14 mm short axis. Similar intense activity within RIGHT paratracheal nodes (SUV max equal 9.9). No supraclavicular adenopathy. Incidental CT findings: Atelectasis and bands of nodular thickening in the LEFT lower lobe do not have focal metabolic activity. However, nodule in the lingula measuring 15 mm (image 70/4) does have intense focal activity with SUV max equal 5.6. Hypermetabolic nodule in the most inferior RIGHT lung base is also suspicious for metastasis measures 16 mm (image 9/4) with SUV max equal 4.3. ABDOMEN/PELVIS: No abnormal hypermetabolic activity within the liver, pancreas, adrenal glands, or spleen. No hypermetabolic lymph nodes in the abdomen or pelvis. Bladder not well evaluated due to radiotracer activity excreted in the urine. Incidental CT findings: none SKELETON: Multiple sites intensely hypermetabolic skeletal metastasis. Example lesion in the LEFT aspect the manubrium with SUV max equal 13 corresponds to lytic lesion on CT image  54/4. Multiple lesion involving the vertebral bodies and posterior elements of the T5-T6 and T7 vertebral bodies. There is lytic destruction of the transverse processes and neural arch at this level (image 63/4). Activity is intense with SUV max equal 11.5. Additional lesions in the L1, L2 and L3 vertebral bodies. Lesion in the LEFT iliac bone along the SI joint. Incidental CT findings: none IMPRESSION: 1. Widespread intensely hypermetabolic skeletal metastasis involving multiple levels of the thoracic and lumbar spine including vertebral bodies and posterior elements. 2. Metastatic skeletal lesions also involve the sternum and LEFT iliac bone, and LEFT scapula. 3. Intensely hypermetabolic mediastinal nodal metastasis 4. Hypermetabolic nodule in the lingula and inferior RIGHT lower lobe are most consistent with pulmonary metastasis. Electronically Signed   By: Suzy Bouchard M.D.   On: 08/04/2018 12:38   Ct Biopsy  Result Date: 08/14/2018 CLINICAL DATA:  History of bladder carcinoma with imaging evidence of metastatic disease involving multiple sites of the skeleton, mediastinal lymph nodes and bilateral pulmonary nodules. The most accessible site for percutaneous biopsy is tumor causing destruction of the posterior elements of the thoracic spine. EXAM: CT GUIDED CORE BIOPSY OF THORACIC PARASPINAL TUMOR ANESTHESIA/SEDATION: 3.0 mg IV Versed; 200 mcg IV Fentanyl Total Moderate Sedation Time:  31 minutes. The patient's level of consciousness and physiologic status were continuously monitored during the procedure by Radiology nursing. PROCEDURE: The procedure risks, benefits, and alternatives were explained to the patient. Questions regarding the procedure were encouraged and answered. The patient understands and consents to the procedure. CT was performed in a prone position. The left thoracic paraspinal region was prepped with chlorhexidine in a sterile fashion, and a sterile drape was applied covering the  operative field. A sterile gown and sterile gloves were used for the procedure. Local anesthesia was provided with 1% Lidocaine. Under CT guidance, a 17 gauge trocar needle was advanced into the posterior paraspinal region of the thoracic spine to the left of midline at the T7 level. A single 18 gauge core biopsy sample was obtained. Gel-Foam pledgets were advanced through the outer needle. The 17 gauge needle was reinserted and adjusted  in position more laterally at the juncture of the medial left seventh rib and left T7 transverse process. Two additional 18 gauge core biopsy samples were obtained at this level. Additional CT was performed after outer needle removal. COMPLICATIONS: Focal hemorrhage in the left posterior paraspinous region. SIR level A: No therapy, no consequence. FINDINGS: Soft tissue in the posterior paraspinous region destroying posterior elements of the T7 vertebral body and extending into the medial aspect of the posterior left seventh rib was targeted. After initial biopsy in the posterior paraspinous region, there was bleeding noted from the outer needle. CT demonstrates focal hemorrhage in the paraspinous musculature which was treated with advancement of Gel-Foam pledgets. This hemorrhage was stable throughout the procedure and did not appear to expand. The outer needle was redirected to a slightly more lateral position after the first biopsy sample was obtained in order to decrease risk of further hemorrhage. IMPRESSION: CT-guided core biopsy performed of paraspinous soft tissue destroying the posterior elements of T7 and invading the medial left seventh rib. Focal posterior paraspinous hemorrhage did occur after the first core biopsy sample was obtained. This was treated with application of Gel-Foam pledgets and the focal hemorrhage did not appear to worsen during the rest of the procedure. Electronically Signed   By: Aletta Edouard M.D.   On: 08/14/2018 14:27        IMPRESSION/PLAN: 1. Recurrent metastatic high grade urothelial carcinoma of the bladder. Dr. Lisbeth Renshaw discusses the pathology findings and reviews the nature of metastatic disease to the skeletal system.  The patient has multiple sites of metastasis, her most symptomatic areas include her thoracic spine around T5-8, as well as intermittent symptoms in the lumbar spine around L1-2.  Dr. Lisbeth Renshaw reviews the rationale to treat both locations, and will avoid the upper chest given that she is undergoing for the cath placement next week. We discussed the risks, benefits, short, and long term effects of radiotherapy, and the patient is interested in proceeding. Dr. Lisbeth Renshaw discusses the delivery and logistics of radiotherapy and anticipates a course of 3 weeks of radiotherapy because of her ongoing systemic therapy that will begin next Thursday. Written consent is obtained and placed in the chart, a copy was provided to the patient.  She will simulate this afternoon at 1 PM.  We anticipate starting her treatment next Tuesday or Wednesday given the Labor Day holiday. 2. Back pain. The patient is unable to tolerate the use of narcotics or tramadol due to sedation. She will try otc lidocaine patches until we start radiotherapy. We will assess this as needed moving forward but are hopeful we will be able to see improvement in her pain with radiotherapy.  The above documentation reflects my direct findings during this shared patient visit. Please see the separate note by Dr. Lisbeth Renshaw on this date for the remainder of the patient's plan of care.    Carola Rhine, PAC

## 2018-08-24 NOTE — Progress Notes (Signed)
[place] Psychosocial Distress Screening Clinical Social Work  Clinical Social Work was referred by distress screening protocol.  The patient scored a 10 on the Psychosocial Distress Thermometer which indicates severe distress. Clinical Social Worker contacted patient by phone to assess for distress and other psychosocial needs. Patient report of "10" was related to severe pain experienced within last week, is now able to get up w much less pain.  Now rates pain/distress as "6."  Sister has come in from out of town to help, also has son who lives w her and is there at night.  Pain has been most distressing symptom of current illness, has used "all different kinds of pain medications" which have not been helpful until patient has found effective pain relief regimen.  No concerns re transportation or support in the home.  Is focusing on "enjoying every day", grateful for pain relief.   ONCBCN DISTRESS SCREENING 08/24/2018  Screening Type Initial Screening  Distress experienced in past week (1-10) 10  Physical Problem type Pain;Nausea/vomiting;Bathing/dressing;Constipation/diarrhea  Other Home Phone: [phone]    Clinical Social Worker follow up needed: No.  If yes, follow up plan:  Beverely Pace, [place], LCSW Clinical Social Worker Phone:  [phone]

## 2018-08-25 ENCOUNTER — Ambulatory Visit: Payer: Medicare Other | Admitting: Radiation Oncology

## 2018-08-29 ENCOUNTER — Other Ambulatory Visit: Payer: Self-pay | Admitting: Physician Assistant

## 2018-08-29 ENCOUNTER — Telehealth: Payer: Self-pay

## 2018-08-29 NOTE — Telephone Encounter (Signed)
Received call from patient requesting appointment information and instructions regarding port placement tomorrow. Reminded NPO after 8 am and provided all appointment times for the the rest of the week as per patient request.

## 2018-08-30 ENCOUNTER — Encounter (HOSPITAL_COMMUNITY): Payer: Self-pay

## 2018-08-30 ENCOUNTER — Other Ambulatory Visit: Payer: Self-pay

## 2018-08-30 ENCOUNTER — Ambulatory Visit (HOSPITAL_COMMUNITY)
Admission: RE | Admit: 2018-08-30 | Discharge: 2018-08-30 | Disposition: A | Discharge status: Home / Self Care | Payer: Medicare Other | Source: Ambulatory Visit | Attending: Oncology | Admitting: Oncology

## 2018-08-30 ENCOUNTER — Other Ambulatory Visit: Payer: Self-pay | Admitting: Oncology

## 2018-08-30 DIAGNOSIS — I10 Essential (primary) hypertension: Secondary | ICD-10-CM | POA: Diagnosis not present

## 2018-08-30 DIAGNOSIS — Z960 Presence of urogenital implants: Secondary | ICD-10-CM | POA: Diagnosis not present

## 2018-08-30 DIAGNOSIS — C674 Malignant neoplasm of posterior wall of bladder: Secondary | ICD-10-CM | POA: Diagnosis not present

## 2018-08-30 DIAGNOSIS — C679 Malignant neoplasm of bladder, unspecified: Secondary | ICD-10-CM

## 2018-08-30 DIAGNOSIS — C7951 Secondary malignant neoplasm of bone: Secondary | ICD-10-CM | POA: Diagnosis not present

## 2018-08-30 DIAGNOSIS — Z9851 Tubal ligation status: Secondary | ICD-10-CM | POA: Insufficient documentation

## 2018-08-30 DIAGNOSIS — Z79899 Other long term (current) drug therapy: Secondary | ICD-10-CM | POA: Insufficient documentation

## 2018-08-30 DIAGNOSIS — Z9889 Other specified postprocedural states: Secondary | ICD-10-CM | POA: Insufficient documentation

## 2018-08-30 DIAGNOSIS — Z5111 Encounter for antineoplastic chemotherapy: Secondary | ICD-10-CM | POA: Diagnosis not present

## 2018-08-30 DIAGNOSIS — Z888 Allergy status to other drugs, medicaments and biological substances status: Secondary | ICD-10-CM | POA: Diagnosis not present

## 2018-08-30 DIAGNOSIS — Z510 Encounter for antineoplastic radiation therapy: Secondary | ICD-10-CM | POA: Insufficient documentation

## 2018-08-30 HISTORY — PX: IR IMAGING GUIDED PORT INSERTION: IMG5740

## 2018-08-30 LAB — CBC
HCT: 40.5 % (ref 36.0–46.0)
HEMOGLOBIN: 13.5 g/dL (ref 12.0–15.0)
MCH: 26.9 pg (ref 26.0–34.0)
MCHC: 33.3 g/dL (ref 30.0–36.0)
MCV: 80.7 fL (ref 78.0–100.0)
PLATELETS: 360 10*3/uL (ref 150–400)
RBC: 5.02 MIL/uL (ref 3.87–5.11)
RDW: 13 % (ref 11.5–15.5)
WBC: 8.5 10*3/uL (ref 4.0–10.5)

## 2018-08-30 LAB — PROTIME-INR
INR: 0.98
Prothrombin Time: 12.8 seconds (ref 11.4–15.2)

## 2018-08-30 LAB — APTT: aPTT: 28 seconds (ref 24–36)

## 2018-08-30 MED ORDER — FENTANYL CITRATE (PF) 100 MCG/2ML IJ SOLN
INTRAMUSCULAR | Status: AC | PRN
  Administered 2018-08-30: 50 ug via INTRAVENOUS

## 2018-08-30 MED ORDER — MIDAZOLAM HCL 2 MG/2ML IJ SOLN
INTRAMUSCULAR | Status: AC
  Filled 2018-08-30: qty 4

## 2018-08-30 MED ORDER — FENTANYL CITRATE (PF) 100 MCG/2ML IJ SOLN
INTRAMUSCULAR | Status: AC
  Filled 2018-08-30: qty 4

## 2018-08-30 MED ORDER — SODIUM CHLORIDE 0.9 % IV SOLN
INTRAVENOUS | Status: DC
  Administered 2018-08-30: 13:00:00 via INTRAVENOUS

## 2018-08-30 MED ORDER — HEPARIN SOD (PORK) LOCK FLUSH 100 UNIT/ML IV SOLN
INTRAVENOUS | Status: AC
  Filled 2018-08-30: qty 5

## 2018-08-30 MED ORDER — CEFAZOLIN SODIUM-DEXTROSE 2-4 GM/100ML-% IV SOLN
2.0000 g | Freq: Once | INTRAVENOUS | Status: AC
  Administered 2018-08-30: 2 g via INTRAVENOUS

## 2018-08-30 MED ORDER — HEPARIN SOD (PORK) LOCK FLUSH 100 UNIT/ML IV SOLN
INTRAVENOUS | Status: AC | PRN
  Administered 2018-08-30: 500 [IU] via INTRAVENOUS

## 2018-08-30 MED ORDER — CEFAZOLIN SODIUM-DEXTROSE 2-4 GM/100ML-% IV SOLN
INTRAVENOUS | Status: AC
  Administered 2018-08-30: 2 g via INTRAVENOUS
  Filled 2018-08-30: qty 100

## 2018-08-30 MED ORDER — MIDAZOLAM HCL 2 MG/2ML IJ SOLN
INTRAMUSCULAR | Status: AC | PRN
  Administered 2018-08-30: 2 mg via INTRAVENOUS

## 2018-08-30 MED ORDER — FLUMAZENIL 0.5 MG/5ML IV SOLN
INTRAVENOUS | Status: AC
  Filled 2018-08-30: qty 5

## 2018-08-30 MED ORDER — NALOXONE HCL 0.4 MG/ML IJ SOLN
INTRAMUSCULAR | Status: AC
  Filled 2018-08-30: qty 1

## 2018-08-30 MED ORDER — LIDOCAINE-EPINEPHRINE (PF) 2 %-1:200000 IJ SOLN
INTRAMUSCULAR | Status: AC
  Filled 2018-08-30: qty 20

## 2018-08-30 NOTE — Procedures (Signed)
  Procedure: R Ij Port   EBL:   minimal Complications:  none immediate  See full dictation in [facility].  Dillard Cannon MD Main # [phone] Pager  [phone]

## 2018-08-30 NOTE — Sedation Documentation (Signed)
Patient is snoring

## 2018-08-30 NOTE — H&P (Signed)
Referring Physician(s): Wyatt Portela  Supervising Physician: Arne Cleveland  Patient Status:  [facility]  Chief Complaint:  "I'm here to get a port put in"  Subjective: Patient familiar to IR service from paraspinal tumor biopsy on 08/14/2018.  She has a history of stage IV bladder carcinoma and presents again today for Port-A-Cath placement for chemotherapy.  She currently denies fever, headache, chest pain, dyspnea, cough, vomiting or abnormal bleeding.  She does have upper abdominal and back discomfort as well as occasional nausea.  Past Medical History:  Diagnosis Date  . Anemia due to acute blood loss 11/24/2016   TRANSFUSED PRBC X2 UNITS 11-25-2016  . Bladder neoplasm   . Cancer ([place])   . Constipation   . Gross hematuria   . Hypertension   . Urgency of urination    Past Surgical History:  Procedure Laterality Date  . CYSTOSCOPY W/ URETERAL STENT PLACEMENT Right 12/28/2016   Procedure: CYSTOSCOPY WITH RIGHT RETROGRADE URETERAL STENT PLACEMENT;  Surgeon: Festus Aloe, MD;  Location: [facility];  Service: Urology;  Laterality: Right;  . TRANSURETHRAL RESECTION OF BLADDER TUMOR WITH MITOMYCIN-C N/A 12/28/2016   Procedure: TRANSURETHRAL RESECTION OF BLADDER TUMOR GREATER THAN 5 CM;  Surgeon: Festus Aloe, MD;  Location: [facility];  Service: Urology;  Laterality: N/A;  . TUBAL LIGATION Bilateral 1990's      Allergies: Tramadol  Medications: Prior to Admission medications   Medication Sig Start Date End Date Taking? Authorizing Provider  amLODipine (NORVASC) 10 MG tablet Take 1 tablet (10 mg total) by mouth every morning. 03/02/17  Yes Robyn Haber, MD  ferrous sulfate 325 (65 FE) MG tablet Start taking only after constipation is relieved. Take 1 tablet once daily for 1 week, then 1 tablet twice daily for 1 week and then 1 tablet three times daily. Patient taking differently: Take 325 mg by mouth. Start taking only after  constipation is relieved. Take 1 tablet once daily for 1 week, then 1 tablet twice daily for 1 week and then 1 tablet three times daily. 11/26/16  Yes Bonnielee Haff, MD  hydrochlorothiazide (HYDRODIURIL) 25 MG tablet TAKE 1 TABLET (25 MG) BY MOUTH ONE TIME DAILY 08/13/18  Yes [provider]  metoprolol succinate (TOPROL-XL) 100 MG 24 hr tablet Take 100 mg by mouth daily. 07/04/18  Yes [provider]  montelukast (SINGULAIR) 10 MG tablet Take 10 mg by mouth daily. 07/14/18  Yes [provider]  olmesartan (BENICAR) 40 MG tablet Take 40 mg by mouth daily. 05/19/18  Yes [provider]  omeprazole (PRILOSEC) 40 MG capsule Take 40 mg by mouth daily. 07/03/18  Yes [provider]  polyethylene glycol (MIRALAX / GLYCOLAX) packet Take 17 g by mouth every other day. 03/02/17  Yes Robyn Haber, MD  prochlorperazine (COMPAZINE) 10 MG tablet Take 1 tablet (10 mg total) by mouth every 6 (six) hours as needed for nausea or vomiting. 08/18/18  Yes Wyatt Portela, MD  rosuvastatin (CRESTOR) 5 MG tablet Take 5 mg by mouth daily. 07/05/18  Yes [provider]  senna-docusate (SENOKOT-S) 8.6-50 MG tablet Take 1 tablet by mouth daily as needed. 03/02/17  Yes Robyn Haber, MD  lidocaine-prilocaine (EMLA) cream Apply 1 application topically as needed. 08/18/18   Wyatt Portela, MD  traMADol (ULTRAM) 50 MG tablet Take 1 tablet (50 mg total) by mouth every 6 (six) hours as needed. 12/28/16   Festus Aloe, MD     Vital Signs: BP (!) 157/75  Pulse 70   Temp 97.7 F (36.5 C) (Oral)   Resp 18   SpO2 99%   Physical Exam awake, alert.  Chest with slightly diminished breath sounds bases.  Heart with regular rate and rhythm.  Abdomen obese, soft, positive bowel sounds, tender epigastric region to palpation.  Lower extremities with  trace edema bilaterally.  Imaging: No results found.  Labs:  CBC: Recent Labs    07/28/18 1005 08/14/18 0756 08/30/18 1316    WBC 8.0 8.9 8.5  HGB 12.1 13.4 13.5  HCT 36.1 40.6 40.5  PLT 299 309 360    COAGS: Recent Labs    08/14/18 0756  INR 1.10  APTT 26    BMP: Recent Labs    07/28/18 1005  NA 140  K 4.0  CL 103  CO2 27  GLUCOSE 116*  BUN 18  CALCIUM 10.0  CREATININE 1.13*  GFRNONAA 50*  GFRAA 58*    LIVER FUNCTION TESTS: Recent Labs    07/28/18 1005  BILITOT 0.4  AST 13*  ALT 8  ALKPHOS 80  PROT 8.1  ALBUMIN 3.5    Assessment and Plan: Pt with history of stage IV bladder carcinoma ; presents  today for Port-A-Cath placement for chemotherapy. Risks and benefits of image guided port-a-catheter placement was discussed with the patient including, but not limited to bleeding, infection, pneumothorax, or fibrin sheath development and need for additional procedures.  All of the patient's questions were answered, patient is agreeable to proceed. Consent signed and in chart.     Electronically Signed: D. Rowe Robert, PA-C 08/30/2018, 1:37 PM   I spent a total of 20 minutes at the the patient's bedside AND on the patient's hospital floor or unit, greater than 50% of which was counseling/coordinating care for Port-A-Cath placement

## 2018-08-30 NOTE — Discharge Instructions (Addendum)
Implanted Port Insertion, Care After °This sheet gives you information about how to care for yourself after your procedure. Your health care provider may also give you more specific instructions. If you have problems or questions, contact your health care provider. °What can I expect after the procedure? °After your procedure, it is common to have: °· Discomfort at the port insertion site. °· Bruising on the skin over the port. This should improve over 3-4 days. ° °Follow these instructions at home: °Port care °· After your port is placed, you will get a manufacturer's information card. The card has information about your port. Keep this card with you at all times. °· Take care of the port as told by your health care provider. Ask your health care provider if you or a family member can get training for taking care of the port at home. A home health care nurse may also take care of the port. °· Make sure to remember what type of port you have. °Incision care °· Follow instructions from your health care provider about how to take care of your port insertion site. Make sure you: °? Wash your hands with soap and water before you change your bandage (dressing). If soap and water are not available, use hand sanitizer. °? Change your dressing as told by your health care provider. °? Leave stitches (sutures), skin glue, or adhesive strips in place. These skin closures may need to stay in place for 2 weeks or longer. If adhesive strip edges start to loosen and curl up, you may trim the loose edges. Do not remove adhesive strips completely unless your health care provider tells you to do that. °· Check your port insertion site every day for signs of infection. Check for: °? More redness, swelling, or pain. °? More fluid or blood. °? Warmth. °? Pus or a bad smell. °General instructions °· Do not take baths, swim, or use a hot tub until your health care provider approves. °· Do not lift anything that is heavier than 10 lb (4.5  kg) for a week, or as told by your health care provider. °· Ask your health care provider when it is okay to: °? Return to work or school. °? Resume usual physical activities or sports. °· Do not drive for 24 hours if you were given a medicine to help you relax (sedative). °· Take over-the-counter and prescription medicines only as told by your health care provider. °· Wear a medical alert bracelet in case of an emergency. This will tell any health care providers that you have a port. °· Keep all follow-up visits as told by your health care provider. This is important. °Contact a health care provider if: °· You cannot flush your port with saline as directed, or you cannot draw blood from the port. °· You have a fever or chills. °· You have more redness, swelling, or pain around your port insertion site. °· You have more fluid or blood coming from your port insertion site. °· Your port insertion site feels warm to the touch. °· You have pus or a bad smell coming from the port insertion site. °Get help right away if: °· You have chest pain or shortness of breath. °· You have bleeding from your port that you cannot control. °Summary °· Take care of the port as told by your health care provider. °· Change your dressing as told by your health care provider. °· Keep all follow-up visits as told by your health care provider. °  This information is not intended to replace advice given to you by your health care provider. Make sure you discuss any questions you have with your health care provider. °Document Released: 10/03/2013 Document Revised: 11/03/2016 Document Reviewed: 11/03/2016 °Elsevier Interactive Patient Education © 2017 [employer] °Implanted Port Home Guide °An implanted port is a type of central line that is placed under the skin. Central lines are used to provide IV access when treatment or nutrition needs to be given through a person’s veins. Implanted ports are used for long-term IV access. An implanted port  may be placed because: °· You need IV medicine that would be irritating to the small veins in your hands or arms. °· You need long-term IV medicines, such as antibiotics. °· You need IV nutrition for a long period. °· You need frequent blood draws for lab tests. °· You need dialysis. ° °Implanted ports are usually placed in the chest area, but they can also be placed in the upper arm, the abdomen, or the leg. An implanted port has two main parts: °· Reservoir. The reservoir is round and will appear as a small, raised area under your skin. The reservoir is the part where a needle is inserted to give medicines or draw blood. °· Catheter. The catheter is a thin, flexible tube that extends from the reservoir. The catheter is placed into a large vein. Medicine that is inserted into the reservoir goes into the catheter and then into the vein. ° °How will I care for my incision site? °Do not get the incision site wet. Bathe or shower as directed by your health care provider. °How is my port accessed? °Special steps must be taken to access the port: °· Before the port is accessed, a numbing cream can be placed on the skin. This helps numb the skin over the port site. °· Your health care provider uses a sterile technique to access the port. °? Your health care provider must put on a mask and sterile gloves. °? The skin over your port is cleaned carefully with an antiseptic and allowed to dry. °? The port is gently pinched between sterile gloves, and a needle is inserted into the port. °· Only "non-coring" port needles should be used to access the port. Once the port is accessed, a blood return should be checked. This helps ensure that the port is in the vein and is not clogged. °· If your port needs to remain accessed for a constant infusion, a clear (transparent) bandage will be placed over the needle site. The bandage and needle will need to be changed every week, or as directed by your health care provider. °· Keep the  bandage covering the needle clean and dry. Do not get it wet. Follow your health care provider’s instructions on how to take a shower or bath while the port is accessed. °· If your port does not need to stay accessed, no bandage is needed over the port. ° °What is flushing? °Flushing helps keep the port from getting clogged. Follow your health care provider’s instructions on how and when to flush the port. Ports are usually flushed with saline solution or a medicine called heparin. The need for flushing will depend on how the port is used. °· If the port is used for intermittent medicines or blood draws, the port will need to be flushed: °? After medicines have been given. °? After blood has been drawn. °? As part of routine maintenance. °· If a constant infusion is   running, the port may not need to be flushed. ° °How long will my port stay implanted? °The port can stay in for as long as your health care provider thinks it is needed. When it is time for the port to come out, surgery will be done to remove it. The procedure is similar to the one performed when the port was put in. °When should I seek immediate medical care? °When you have an implanted port, you should seek immediate medical care if: °· You notice a bad smell coming from the incision site. °· You have swelling, redness, or drainage at the incision site. °· You have more swelling or pain at the port site or the surrounding area. °· You have a fever that is not controlled with medicine. ° °This information is not intended to replace advice given to you by your health care provider. Make sure you discuss any questions you have with your health care provider. °Document Released: 12/13/2005 Document Revised: 05/20/2016 Document Reviewed: 08/20/2013 °Elsevier Interactive Patient Education © 2017 [employer] °Moderate Conscious Sedation, Adult, Care After °These instructions provide you with information about caring for yourself after your procedure. Your  health care provider may also give you more specific instructions. Your treatment has been planned according to current medical practices, but problems sometimes occur. Call your health care provider if you have any problems or questions after your procedure. °What can I expect after the procedure? °After your procedure, it is common: °· To feel sleepy for several hours. °· To feel clumsy and have poor balance for several hours. °· To have poor judgment for several hours. °· To vomit if you eat too soon. ° °Follow these instructions at home: °For at least 24 hours after the procedure: ° °· Do not: °? Participate in activities where you could fall or become injured. °? Drive. °? Use heavy machinery. °? Drink alcohol. °? Take sleeping pills or medicines that cause drowsiness. °? Make important decisions or sign legal documents. °? Take care of children on your own. °· Rest. °Eating and drinking °· Follow the diet recommended by your health care provider. °· If you vomit: °? Drink water, juice, or soup when you can drink without vomiting. °? Make sure you have little or no nausea before eating solid foods. °General instructions °· Have a responsible adult stay with you until you are awake and alert. °· Take over-the-counter and prescription medicines only as told by your health care provider. °· If you smoke, do not smoke without supervision. °· Keep all follow-up visits as told by your health care provider. This is important. °Contact a health care provider if: °· You keep feeling nauseous or you keep vomiting. °· You feel light-headed. °· You develop a rash. °· You have a fever. °Get help right away if: °· You have trouble breathing. °This information is not intended to replace advice given to you by your health care provider. Make sure you discuss any questions you have with your health care provider. °Document Released: 10/03/2013 Document Revised: 05/17/2016 Document Reviewed: 04/03/2016 °[employer]vier Interactive  Patient Education © 2018 [employer] ° °

## 2018-08-31 ENCOUNTER — Ambulatory Visit
Admission: RE | Admit: 2018-08-31 | Discharge: 2018-08-31 | Disposition: A | Discharge status: Home / Self Care | Payer: Medicare Other | Source: Ambulatory Visit | Attending: Radiation Oncology | Admitting: Radiation Oncology

## 2018-08-31 ENCOUNTER — Inpatient Hospital Stay: Payer: Medicare Other

## 2018-08-31 ENCOUNTER — Inpatient Hospital Stay: Payer: Medicare Other | Attending: Oncology

## 2018-08-31 VITALS — BP 170/75 | HR 72 | Temp 98.5°F | Resp 17 | Wt 168.0 lb

## 2018-08-31 DIAGNOSIS — R110 Nausea: Secondary | ICD-10-CM | POA: Insufficient documentation

## 2018-08-31 DIAGNOSIS — C679 Malignant neoplasm of bladder, unspecified: Secondary | ICD-10-CM | POA: Diagnosis not present

## 2018-08-31 DIAGNOSIS — Z79899 Other long term (current) drug therapy: Secondary | ICD-10-CM | POA: Insufficient documentation

## 2018-08-31 DIAGNOSIS — C7800 Secondary malignant neoplasm of unspecified lung: Secondary | ICD-10-CM | POA: Diagnosis not present

## 2018-08-31 DIAGNOSIS — Z923 Personal history of irradiation: Secondary | ICD-10-CM | POA: Insufficient documentation

## 2018-08-31 DIAGNOSIS — R531 Weakness: Secondary | ICD-10-CM | POA: Diagnosis not present

## 2018-08-31 DIAGNOSIS — C781 Secondary malignant neoplasm of mediastinum: Secondary | ICD-10-CM | POA: Insufficient documentation

## 2018-08-31 DIAGNOSIS — Z5111 Encounter for antineoplastic chemotherapy: Secondary | ICD-10-CM | POA: Insufficient documentation

## 2018-08-31 DIAGNOSIS — C7951 Secondary malignant neoplasm of bone: Secondary | ICD-10-CM | POA: Insufficient documentation

## 2018-08-31 LAB — CMP (CANCER CENTER ONLY)
ALT: 8 U/L (ref 0–44)
ANION GAP: 12 (ref 5–15)
AST: 17 U/L (ref 15–41)
Albumin: 3.7 g/dL (ref 3.5–5.0)
Alkaline Phosphatase: 95 U/L (ref 38–126)
BUN: 21 mg/dL (ref 8–23)
CHLORIDE: 100 mmol/L (ref 98–111)
CO2: 25 mmol/L (ref 22–32)
Calcium: 10.7 mg/dL — ABNORMAL HIGH (ref 8.9–10.3)
Creatinine: 1.13 mg/dL — ABNORMAL HIGH (ref 0.44–1.00)
GFR, EST AFRICAN AMERICAN: 58 mL/min — AB (ref >60)
GFR, Estimated: 50 mL/min — ABNORMAL LOW (ref >60)
Glucose, Bld: 107 mg/dL — ABNORMAL HIGH (ref 70–99)
POTASSIUM: 4.2 mmol/L (ref 3.5–5.1)
SODIUM: 137 mmol/L (ref 135–145)
Total Bilirubin: 0.5 mg/dL (ref 0.3–1.2)
Total Protein: 8.4 g/dL — ABNORMAL HIGH (ref 6.5–8.1)

## 2018-08-31 LAB — CBC WITH DIFFERENTIAL (CANCER CENTER ONLY)
Basophils Absolute: 0 10*3/uL (ref 0.0–0.1)
Basophils Relative: 1 %
Eosinophils Absolute: 0.3 10*3/uL (ref 0.0–0.5)
Eosinophils Relative: 3 %
HEMATOCRIT: 41.6 % (ref 34.8–46.6)
HEMOGLOBIN: 13.7 g/dL (ref 11.6–15.9)
LYMPHS ABS: 1.7 10*3/uL (ref 0.9–3.3)
LYMPHS PCT: 23 %
MCH: 26.8 pg (ref 25.1–34.0)
MCHC: 32.9 g/dL (ref 31.5–36.0)
MCV: 81.4 fL (ref 79.5–101.0)
MONOS PCT: 11 %
Monocytes Absolute: 0.8 10*3/uL (ref 0.1–0.9)
NEUTROS ABS: 4.6 10*3/uL (ref 1.5–6.5)
NEUTROS PCT: 62 %
Platelet Count: 267 10*3/uL (ref 145–400)
RBC: 5.11 MIL/uL (ref 3.70–5.45)
RDW: 13.1 % (ref 11.2–14.5)
WBC Count: 7.4 10*3/uL (ref 3.9–10.3)

## 2018-08-31 MED ORDER — SODIUM CHLORIDE 0.9 % IV SOLN
Freq: Once | INTRAVENOUS | Status: AC
  Administered 2018-08-31: 12:00:00 via INTRAVENOUS
  Filled 2018-08-31: qty 5

## 2018-08-31 MED ORDER — PALONOSETRON HCL INJECTION 0.25 MG/5ML
0.2500 mg | Freq: Once | INTRAVENOUS | Status: AC
  Administered 2018-08-31: 0.25 mg via INTRAVENOUS

## 2018-08-31 MED ORDER — POTASSIUM CHLORIDE 2 MEQ/ML IV SOLN
Freq: Once | INTRAVENOUS | Status: AC
  Administered 2018-08-31: 11:00:00 via INTRAVENOUS
  Filled 2018-08-31: qty 10

## 2018-08-31 MED ORDER — SODIUM CHLORIDE 0.9 % IV SOLN
70.0000 mg/m2 | Freq: Once | INTRAVENOUS | Status: AC
  Administered 2018-08-31: 127 mg via INTRAVENOUS
  Filled 2018-08-31: qty 100

## 2018-08-31 MED ORDER — SODIUM CHLORIDE 0.9 % IV SOLN
Freq: Once | INTRAVENOUS | Status: AC
  Administered 2018-08-31: 11:00:00 via INTRAVENOUS
  Filled 2018-08-31: qty 250

## 2018-08-31 MED ORDER — SODIUM CHLORIDE 0.9 % IV SOLN
1800.0000 mg | Freq: Once | INTRAVENOUS | Status: AC
  Administered 2018-08-31: 1800 mg via INTRAVENOUS
  Filled 2018-08-31: qty 47.34

## 2018-08-31 MED ORDER — PALONOSETRON HCL INJECTION 0.25 MG/5ML
INTRAVENOUS | Status: AC
  Filled 2018-08-31: qty 5

## 2018-08-31 NOTE — Progress Notes (Signed)
Discharge instructions printed and verbally reviewed. Patient and sisters verbalized understanding.

## 2018-08-31 NOTE — Patient Instructions (Signed)
[facility] Discharge Instructions for Patients Receiving Chemotherapy  Today you received the following chemotherapy agents Cisplatin, Gemzar  To help prevent nausea and vomiting after your treatment, we encourage you to take your nausea medication as directed   If you develop nausea and vomiting that is not controlled by your nausea medication, call the clinic.   BELOW ARE SYMPTOMS THAT SHOULD BE REPORTED IMMEDIATELY:  *FEVER GREATER THAN 100.5 F  *CHILLS WITH OR WITHOUT FEVER  NAUSEA AND VOMITING THAT IS NOT CONTROLLED WITH YOUR NAUSEA MEDICATION  *UNUSUAL SHORTNESS OF BREATH  *UNUSUAL BRUISING OR BLEEDING  TENDERNESS IN MOUTH AND THROAT WITH OR WITHOUT PRESENCE OF ULCERS  *URINARY PROBLEMS  *BOWEL PROBLEMS  UNUSUAL RASH Items with * indicate a potential emergency and should be followed up as soon as possible.  Feel free to call the clinic should you have any questions or concerns. The clinic phone number is [phone].  Please show the [facility] at check-in to the Emergency Department and triage nurse.   Cisplatin injection What is this medicine? CISPLATIN (SIS pla tin) is a chemotherapy drug. It targets fast dividing cells, like cancer cells, and causes these cells to die. This medicine is used to treat many types of cancer like bladder, ovarian, and testicular cancers. This medicine may be used for other purposes; ask your health care provider or pharmacist if you have questions. COMMON BRAND NAME(S): Platinol, Platinol -AQ What should I tell my health care provider before I take this medicine? They need to know if you have any of these conditions: -blood disorders -hearing problems -kidney disease -recent or ongoing radiation therapy -an unusual or allergic reaction to cisplatin, carboplatin, other chemotherapy, other medicines, foods, dyes, or preservatives -pregnant or trying to get pregnant -breast-feeding How should I use this  medicine? This drug is given as an infusion into a vein. It is administered in a hospital or clinic by a specially trained health care professional. Talk to your pediatrician regarding the use of this medicine in children. Special care may be needed. Overdosage: If you think you have taken too much of this medicine contact a poison control center or emergency room at once. NOTE: This medicine is only for you. Do not share this medicine with others. What if I miss a dose? It is important not to miss a dose. Call your doctor or health care professional if you are unable to keep an appointment. What may interact with this medicine? -dofetilide -foscarnet -medicines for seizures -medicines to increase blood counts like filgrastim, pegfilgrastim, sargramostim -probenecid -pyridoxine used with altretamine -rituximab -some antibiotics like amikacin, gentamicin, neomycin, polymyxin B, streptomycin, tobramycin -sulfinpyrazone -vaccines -zalcitabine Talk to your doctor or health care professional before taking any of these medicines: -acetaminophen -aspirin -ibuprofen -ketoprofen -naproxen This list may not describe all possible interactions. Give your health care provider a list of all the medicines, herbs, non-prescription drugs, or dietary supplements you use. Also tell them if you smoke, drink alcohol, or use illegal drugs. Some items may interact with your medicine. What should I watch for while using this medicine? Your condition will be monitored carefully while you are receiving this medicine. You will need important blood work done while you are taking this medicine. This drug may make you feel generally unwell. This is not uncommon, as chemotherapy can affect healthy cells as well as cancer cells. Report any side effects. Continue your course of treatment even though you feel ill unless your doctor tells you  to stop. In some cases, you may be given additional medicines to help with side  effects. Follow all directions for their use. Call your doctor or health care professional for advice if you get a fever, chills or sore throat, or other symptoms of a cold or flu. Do not treat yourself. This drug decreases your body's ability to fight infections. Try to avoid being around people who are sick. This medicine may increase your risk to bruise or bleed. Call your doctor or health care professional if you notice any unusual bleeding. Be careful brushing and flossing your teeth or using a toothpick because you may get an infection or bleed more easily. If you have any dental work done, tell your dentist you are receiving this medicine. Avoid taking products that contain aspirin, acetaminophen, ibuprofen, naproxen, or ketoprofen unless instructed by your doctor. These medicines may hide a fever. Do not become pregnant while taking this medicine. Women should inform their doctor if they wish to become pregnant or think they might be pregnant. There is a potential for serious side effects to an unborn child. Talk to your health care professional or pharmacist for more information. Do not breast-feed an infant while taking this medicine. Drink fluids as directed while you are taking this medicine. This will help protect your kidneys. Call your doctor or health care professional if you get diarrhea. Do not treat yourself. What side effects may I notice from receiving this medicine? Side effects that you should report to your doctor or health care professional as soon as possible: -allergic reactions like skin rash, itching or hives, swelling of the face, lips, or tongue -signs of infection - fever or chills, cough, sore throat, pain or difficulty passing urine -signs of decreased platelets or bleeding - bruising, pinpoint red spots on the skin, black, tarry stools, nosebleeds -signs of decreased red blood cells - unusually weak or tired, fainting spells, lightheadedness -breathing  problems -changes in hearing -gout pain -low blood counts - This drug may decrease the number of white blood cells, red blood cells and platelets. You may be at increased risk for infections and bleeding. -nausea and vomiting -pain, swelling, redness or irritation at the injection site -pain, tingling, numbness in the hands or feet -problems with balance, movement -trouble passing urine or change in the amount of urine Side effects that usually do not require medical attention (report to your doctor or health care professional if they continue or are bothersome): -changes in vision -loss of appetite -metallic taste in the mouth or changes in taste This list may not describe all possible side effects. Call your doctor for medical advice about side effects. You may report side effects to FDA at 1-800-FDA-1088. Where should I keep my medicine? This drug is given in a hospital or clinic and will not be stored at home. NOTE: This sheet is a summary. It may not cover all possible information. If you have questions about this medicine, talk to your doctor, pharmacist, or health care provider.  2018 [employer] Standard (2008-03-19 14:40:54)   Gemcitabine injection What is this medicine? GEMCITABINE (jem SIT a been) is a chemotherapy drug. This medicine is used to treat many types of cancer like breast cancer, lung cancer, pancreatic cancer, and ovarian cancer. This medicine may be used for other purposes; ask your health care provider or pharmacist if you have questions. COMMON BRAND NAME(S): Gemzar What should I tell my health care provider before I take this medicine? They need to know if  you have any of these conditions: -blood disorders -infection -kidney disease -liver disease -recent or ongoing radiation therapy -an unusual or allergic reaction to gemcitabine, other chemotherapy, other medicines, foods, dyes, or preservatives -pregnant or trying to get  pregnant -breast-feeding How should I use this medicine? This drug is given as an infusion into a vein. It is administered in a hospital or clinic by a specially trained health care professional. Talk to your pediatrician regarding the use of this medicine in children. Special care may be needed. Overdosage: If you think you have taken too much of this medicine contact a poison control center or emergency room at once. NOTE: This medicine is only for you. Do not share this medicine with others. What if I miss a dose? It is important not to miss your dose. Call your doctor or health care professional if you are unable to keep an appointment. What may interact with this medicine? -medicines to increase blood counts like filgrastim, pegfilgrastim, sargramostim -some other chemotherapy drugs like cisplatin -vaccines Talk to your doctor or health care professional before taking any of these medicines: -acetaminophen -aspirin -ibuprofen -ketoprofen -naproxen This list may not describe all possible interactions. Give your health care provider a list of all the medicines, herbs, non-prescription drugs, or dietary supplements you use. Also tell them if you smoke, drink alcohol, or use illegal drugs. Some items may interact with your medicine. What should I watch for while using this medicine? Visit your doctor for checks on your progress. This drug may make you feel generally unwell. This is not uncommon, as chemotherapy can affect healthy cells as well as cancer cells. Report any side effects. Continue your course of treatment even though you feel ill unless your doctor tells you to stop. In some cases, you may be given additional medicines to help with side effects. Follow all directions for their use. Call your doctor or health care professional for advice if you get a fever, chills or sore throat, or other symptoms of a cold or flu. Do not treat yourself. This drug decreases your body's ability to  fight infections. Try to avoid being around people who are sick. This medicine may increase your risk to bruise or bleed. Call your doctor or health care professional if you notice any unusual bleeding. Be careful brushing and flossing your teeth or using a toothpick because you may get an infection or bleed more easily. If you have any dental work done, tell your dentist you are receiving this medicine. Avoid taking products that contain aspirin, acetaminophen, ibuprofen, naproxen, or ketoprofen unless instructed by your doctor. These medicines may hide a fever. Women should inform their doctor if they wish to become pregnant or think they might be pregnant. There is a potential for serious side effects to an unborn child. Talk to your health care professional or pharmacist for more information. Do not breast-feed an infant while taking this medicine. What side effects may I notice from receiving this medicine? Side effects that you should report to your doctor or health care professional as soon as possible: -allergic reactions like skin rash, itching or hives, swelling of the face, lips, or tongue -low blood counts - this medicine may decrease the number of white blood cells, red blood cells and platelets. You may be at increased risk for infections and bleeding. -signs of infection - fever or chills, cough, sore throat, pain or difficulty passing urine -signs of decreased platelets or bleeding - bruising, pinpoint red spots on the  skin, black, tarry stools, blood in the urine -signs of decreased red blood cells - unusually weak or tired, fainting spells, lightheadedness -breathing problems -chest pain -mouth sores -nausea and vomiting -pain, swelling, redness at site where injected -pain, tingling, numbness in the hands or feet -stomach pain -swelling of ankles, feet, hands -unusual bleeding Side effects that usually do not require medical attention (report to your doctor or health care  professional if they continue or are bothersome): -constipation -diarrhea -hair loss -loss of appetite -stomach upset This list may not describe all possible side effects. Call your doctor for medical advice about side effects. You may report side effects to FDA at 1-800-FDA-1088. Where should I keep my medicine? This drug is given in a hospital or clinic and will not be stored at home. NOTE: This sheet is a summary. It may not cover all possible information. If you have questions about this medicine, talk to your doctor, pharmacist, or health care provider.  2018 [employer]/Gold Standard (2008-04-23 18:45:54)

## 2018-09-01 ENCOUNTER — Ambulatory Visit
Admission: RE | Admit: 2018-09-01 | Discharge: 2018-09-01 | Disposition: A | Discharge status: Home / Self Care | Payer: Medicare Other | Source: Ambulatory Visit | Attending: Radiation Oncology | Admitting: Radiation Oncology

## 2018-09-01 ENCOUNTER — Encounter (HOSPITAL_COMMUNITY): Payer: Self-pay | Admitting: Interventional Radiology

## 2018-09-01 DIAGNOSIS — C7951 Secondary malignant neoplasm of bone: Secondary | ICD-10-CM | POA: Diagnosis not present

## 2018-09-02 ENCOUNTER — Encounter (HOSPITAL_COMMUNITY): Payer: Self-pay | Admitting: Emergency Medicine

## 2018-09-02 ENCOUNTER — Other Ambulatory Visit: Payer: Self-pay

## 2018-09-02 ENCOUNTER — Emergency Department (HOSPITAL_COMMUNITY): Payer: Medicare Other

## 2018-09-02 ENCOUNTER — Emergency Department (HOSPITAL_COMMUNITY)
Admission: EM | Admit: 2018-09-02 | Discharge: 2018-09-03 | Disposition: A | Discharge status: Home / Self Care | Payer: Medicare Other | Source: Home / Self Care | Attending: Emergency Medicine | Admitting: Emergency Medicine

## 2018-09-02 DIAGNOSIS — I10 Essential (primary) hypertension: Secondary | ICD-10-CM

## 2018-09-02 DIAGNOSIS — Z79899 Other long term (current) drug therapy: Secondary | ICD-10-CM

## 2018-09-02 DIAGNOSIS — C679 Malignant neoplasm of bladder, unspecified: Secondary | ICD-10-CM | POA: Diagnosis not present

## 2018-09-02 DIAGNOSIS — C7951 Secondary malignant neoplasm of bone: Secondary | ICD-10-CM | POA: Diagnosis not present

## 2018-09-02 DIAGNOSIS — R509 Fever, unspecified: Secondary | ICD-10-CM | POA: Diagnosis not present

## 2018-09-02 DIAGNOSIS — Z5111 Encounter for antineoplastic chemotherapy: Secondary | ICD-10-CM | POA: Diagnosis not present

## 2018-09-02 DIAGNOSIS — R110 Nausea: Secondary | ICD-10-CM | POA: Insufficient documentation

## 2018-09-02 DIAGNOSIS — R531 Weakness: Secondary | ICD-10-CM | POA: Insufficient documentation

## 2018-09-02 DIAGNOSIS — Z8551 Personal history of malignant neoplasm of bladder: Secondary | ICD-10-CM

## 2018-09-02 DIAGNOSIS — C781 Secondary malignant neoplasm of mediastinum: Secondary | ICD-10-CM | POA: Diagnosis not present

## 2018-09-02 DIAGNOSIS — C7800 Secondary malignant neoplasm of unspecified lung: Secondary | ICD-10-CM | POA: Diagnosis not present

## 2018-09-02 DIAGNOSIS — I959 Hypotension, unspecified: Secondary | ICD-10-CM | POA: Diagnosis not present

## 2018-09-02 DIAGNOSIS — Z923 Personal history of irradiation: Secondary | ICD-10-CM | POA: Diagnosis not present

## 2018-09-02 LAB — I-STAT CG4 LACTIC ACID, ED: Lactic Acid, Venous: 2.07 mmol/L (ref 0.5–1.9)

## 2018-09-02 LAB — COMPREHENSIVE METABOLIC PANEL
ALK PHOS: 90 U/L (ref 38–126)
ALT: 16 U/L (ref 0–44)
AST: 22 U/L (ref 15–41)
Albumin: 3.7 g/dL (ref 3.5–5.0)
Anion gap: 13 (ref 5–15)
BUN: 27 mg/dL — ABNORMAL HIGH (ref 8–23)
CALCIUM: 10.6 mg/dL — AB (ref 8.9–10.3)
CO2: 27 mmol/L (ref 22–32)
CREATININE: 1.17 mg/dL — AB (ref 0.44–1.00)
Chloride: 92 mmol/L — ABNORMAL LOW (ref 98–111)
GFR, EST AFRICAN AMERICAN: 55 mL/min — AB (ref >60)
GFR, EST NON AFRICAN AMERICAN: 48 mL/min — AB (ref >60)
Glucose, Bld: 207 mg/dL — ABNORMAL HIGH (ref 70–99)
Potassium: 4 mmol/L (ref 3.5–5.1)
Sodium: 132 mmol/L — ABNORMAL LOW (ref 135–145)
Total Bilirubin: 0.9 mg/dL (ref 0.3–1.2)
Total Protein: 8.2 g/dL — ABNORMAL HIGH (ref 6.5–8.1)

## 2018-09-02 LAB — CBC WITH DIFFERENTIAL/PLATELET
BASOS PCT: 0 %
Basophils Absolute: 0 10*3/uL (ref 0.0–0.1)
EOS ABS: 0.2 10*3/uL (ref 0.0–0.7)
Eosinophils Relative: 2 %
HEMATOCRIT: 39.5 % (ref 36.0–46.0)
Hemoglobin: 13.3 g/dL (ref 12.0–15.0)
Lymphocytes Relative: 8 %
Lymphs Abs: 0.9 10*3/uL (ref 0.7–4.0)
MCH: 27.4 pg (ref 26.0–34.0)
MCHC: 33.7 g/dL (ref 30.0–36.0)
MCV: 81.4 fL (ref 78.0–100.0)
Monocytes Absolute: 0.1 10*3/uL (ref 0.1–1.0)
Monocytes Relative: 1 %
NEUTROS PCT: 89 %
Neutro Abs: 9.9 10*3/uL — ABNORMAL HIGH (ref 1.7–7.7)
PLATELETS: 311 10*3/uL (ref 150–400)
RBC: 4.85 MIL/uL (ref 3.87–5.11)
RDW: 13 % (ref 11.5–15.5)
WBC: 11.1 10*3/uL — AB (ref 4.0–10.5)

## 2018-09-02 LAB — CBG MONITORING, ED: GLUCOSE-CAPILLARY: 164 mg/dL — AB (ref 70–99)

## 2018-09-02 MED ORDER — ONDANSETRON HCL 4 MG/2ML IJ SOLN
4.0000 mg | Freq: Once | INTRAMUSCULAR | Status: AC
  Administered 2018-09-02: 4 mg via INTRAVENOUS
  Filled 2018-09-02: qty 2

## 2018-09-02 MED ORDER — LACTATED RINGERS IV BOLUS
1000.0000 mL | Freq: Once | INTRAVENOUS | Status: AC
  Administered 2018-09-02: 1000 mL via INTRAVENOUS

## 2018-09-02 NOTE — ED Triage Notes (Signed)
Pt presents from home with family for increasing nausea and had Chemo and radiation tereatment on 08/31/18 with port-a-cath being placed on 08/30/18. Pt reports not having bowel movement in 4 days and has been having increasing weakness and sweating today. Pt was hypotensive at time of triage and Dr.Goldson was made aware.

## 2018-09-03 ENCOUNTER — Emergency Department (HOSPITAL_COMMUNITY): Payer: Medicare Other

## 2018-09-03 ENCOUNTER — Encounter (HOSPITAL_COMMUNITY): Payer: Self-pay | Admitting: Emergency Medicine

## 2018-09-03 ENCOUNTER — Inpatient Hospital Stay (HOSPITAL_COMMUNITY)
Admission: EM | Admit: 2018-09-03 | Discharge: 2018-09-08 | DRG: 864 | Disposition: A | Discharge status: Skilled Nursing Facility | Payer: Medicare Other | Attending: Internal Medicine | Admitting: Internal Medicine

## 2018-09-03 DIAGNOSIS — C7800 Secondary malignant neoplasm of unspecified lung: Secondary | ICD-10-CM | POA: Diagnosis not present

## 2018-09-03 DIAGNOSIS — M549 Dorsalgia, unspecified: Secondary | ICD-10-CM | POA: Diagnosis present

## 2018-09-03 DIAGNOSIS — I10 Essential (primary) hypertension: Secondary | ICD-10-CM | POA: Diagnosis not present

## 2018-09-03 DIAGNOSIS — D630 Anemia in neoplastic disease: Secondary | ICD-10-CM | POA: Diagnosis present

## 2018-09-03 DIAGNOSIS — C679 Malignant neoplasm of bladder, unspecified: Secondary | ICD-10-CM | POA: Diagnosis not present

## 2018-09-03 DIAGNOSIS — K5900 Constipation, unspecified: Secondary | ICD-10-CM | POA: Diagnosis present

## 2018-09-03 DIAGNOSIS — R279 Unspecified lack of coordination: Secondary | ICD-10-CM | POA: Diagnosis not present

## 2018-09-03 DIAGNOSIS — Z79899 Other long term (current) drug therapy: Secondary | ICD-10-CM | POA: Diagnosis not present

## 2018-09-03 DIAGNOSIS — C7951 Secondary malignant neoplasm of bone: Secondary | ICD-10-CM | POA: Diagnosis not present

## 2018-09-03 DIAGNOSIS — Z743 Need for continuous supervision: Secondary | ICD-10-CM | POA: Diagnosis not present

## 2018-09-03 DIAGNOSIS — A419 Sepsis, unspecified organism: Secondary | ICD-10-CM | POA: Diagnosis not present

## 2018-09-03 DIAGNOSIS — E871 Hypo-osmolality and hyponatremia: Secondary | ICD-10-CM

## 2018-09-03 DIAGNOSIS — R5081 Fever presenting with conditions classified elsewhere: Secondary | ICD-10-CM | POA: Diagnosis not present

## 2018-09-03 DIAGNOSIS — R112 Nausea with vomiting, unspecified: Secondary | ICD-10-CM | POA: Diagnosis not present

## 2018-09-03 DIAGNOSIS — R6510 Systemic inflammatory response syndrome (SIRS) of non-infectious origin without acute organ dysfunction: Secondary | ICD-10-CM | POA: Diagnosis not present

## 2018-09-03 DIAGNOSIS — E785 Hyperlipidemia, unspecified: Secondary | ICD-10-CM

## 2018-09-03 DIAGNOSIS — T451X5A Adverse effect of antineoplastic and immunosuppressive drugs, initial encounter: Secondary | ICD-10-CM | POA: Diagnosis present

## 2018-09-03 DIAGNOSIS — R1110 Vomiting, unspecified: Secondary | ICD-10-CM | POA: Diagnosis not present

## 2018-09-03 DIAGNOSIS — C419 Malignant neoplasm of bone and articular cartilage, unspecified: Secondary | ICD-10-CM | POA: Diagnosis not present

## 2018-09-03 DIAGNOSIS — R509 Fever, unspecified: Principal | ICD-10-CM | POA: Diagnosis present

## 2018-09-03 DIAGNOSIS — C781 Secondary malignant neoplasm of mediastinum: Secondary | ICD-10-CM | POA: Diagnosis not present

## 2018-09-03 DIAGNOSIS — J90 Pleural effusion, not elsewhere classified: Secondary | ICD-10-CM | POA: Diagnosis not present

## 2018-09-03 LAB — URINALYSIS, ROUTINE W REFLEX MICROSCOPIC
BILIRUBIN URINE: NEGATIVE
Bacteria, UA: NONE SEEN
Bilirubin Urine: NEGATIVE
GLUCOSE, UA: NEGATIVE mg/dL
Glucose, UA: NEGATIVE mg/dL
Ketones, ur: NEGATIVE mg/dL
Ketones, ur: NEGATIVE mg/dL
Leukocytes, UA: NEGATIVE
Nitrite: NEGATIVE
Nitrite: NEGATIVE
PH: 6 (ref 5.0–8.0)
PH: 7 (ref 5.0–8.0)
Protein, ur: NEGATIVE mg/dL
Protein, ur: NEGATIVE mg/dL
SPECIFIC GRAVITY, URINE: 1.021 (ref 1.005–1.030)
Specific Gravity, Urine: 1.011 (ref 1.005–1.030)

## 2018-09-03 LAB — COMPREHENSIVE METABOLIC PANEL
ALT: 14 U/L (ref 0–44)
ANION GAP: 10 (ref 5–15)
AST: 22 U/L (ref 15–41)
Albumin: 3.3 g/dL — ABNORMAL LOW (ref 3.5–5.0)
Alkaline Phosphatase: 82 U/L (ref 38–126)
BUN: 23 mg/dL (ref 8–23)
CHLORIDE: 88 mmol/L — AB (ref 98–111)
CO2: 28 mmol/L (ref 22–32)
Calcium: 9.9 mg/dL (ref 8.9–10.3)
Creatinine, Ser: 1.12 mg/dL — ABNORMAL HIGH (ref 0.44–1.00)
GFR calc non Af Amer: 50 mL/min — ABNORMAL LOW (ref >60)
GFR, EST AFRICAN AMERICAN: 58 mL/min — AB (ref >60)
Glucose, Bld: 169 mg/dL — ABNORMAL HIGH (ref 70–99)
POTASSIUM: 3.6 mmol/L (ref 3.5–5.1)
SODIUM: 126 mmol/L — AB (ref 135–145)
Total Bilirubin: 1 mg/dL (ref 0.3–1.2)
Total Protein: 7.7 g/dL (ref 6.5–8.1)

## 2018-09-03 LAB — CBC WITH DIFFERENTIAL/PLATELET
BASOS ABS: 0 10*3/uL (ref 0.0–0.1)
Basophils Relative: 0 %
EOS PCT: 0 %
Eosinophils Absolute: 0 10*3/uL (ref 0.0–0.7)
HCT: 37.8 % (ref 36.0–46.0)
Hemoglobin: 12.9 g/dL (ref 12.0–15.0)
LYMPHS PCT: 3 %
Lymphs Abs: 0.4 10*3/uL — ABNORMAL LOW (ref 0.7–4.0)
MCH: 26.9 pg (ref 26.0–34.0)
MCHC: 34.1 g/dL (ref 30.0–36.0)
MCV: 78.9 fL (ref 78.0–100.0)
Monocytes Absolute: 0.1 10*3/uL (ref 0.1–1.0)
Monocytes Relative: 1 %
NEUTROS ABS: 13.3 10*3/uL — AB (ref 1.7–7.7)
Neutrophils Relative %: 96 %
PLATELETS: 284 10*3/uL (ref 150–400)
RBC: 4.79 MIL/uL (ref 3.87–5.11)
RDW: 12.7 % (ref 11.5–15.5)
WBC: 13.8 10*3/uL — ABNORMAL HIGH (ref 4.0–10.5)

## 2018-09-03 LAB — I-STAT CG4 LACTIC ACID, ED
LACTIC ACID, VENOUS: 1.42 mmol/L (ref 0.5–1.9)
LACTIC ACID, VENOUS: 1.25 mmol/L (ref 0.5–1.9)

## 2018-09-03 LAB — MAGNESIUM
Magnesium: 2 mg/dL (ref 1.7–2.4)
Magnesium: 2 mg/dL (ref 1.7–2.4)

## 2018-09-03 LAB — LIPASE, BLOOD: LIPASE: 39 U/L (ref 11–51)

## 2018-09-03 LAB — PHOSPHORUS: Phosphorus: 3.1 mg/dL (ref 2.5–4.6)

## 2018-09-03 MED ORDER — MONTELUKAST SODIUM 10 MG PO TABS
10.0000 mg | ORAL_TABLET | Freq: Every day | ORAL | Status: DC
  Administered 2018-09-04 – 2018-09-08 (×5): 10 mg via ORAL
  Filled 2018-09-03 (×5): qty 1

## 2018-09-03 MED ORDER — ACETAMINOPHEN 650 MG RE SUPP: 650.0000 mg | Freq: Four times a day (QID) | RECTAL | Status: DC | PRN

## 2018-09-03 MED ORDER — VANCOMYCIN HCL IN DEXTROSE 1-5 GM/200ML-% IV SOLN: 1000.0000 mg | Freq: Once | INTRAVENOUS | Status: DC

## 2018-09-03 MED ORDER — ROSUVASTATIN CALCIUM 5 MG PO TABS
5.0000 mg | ORAL_TABLET | Freq: Every day | ORAL | Status: DC
  Administered 2018-09-04 – 2018-09-08 (×5): 5 mg via ORAL
  Filled 2018-09-03 (×5): qty 1

## 2018-09-03 MED ORDER — SODIUM CHLORIDE 0.9 % IV BOLUS
1000.0000 mL | Freq: Once | INTRAVENOUS | Status: AC
  Administered 2018-09-03: 1000 mL via INTRAVENOUS

## 2018-09-03 MED ORDER — MORPHINE SULFATE (PF) 4 MG/ML IV SOLN
4.0000 mg | Freq: Once | INTRAVENOUS | Status: AC
  Administered 2018-09-03: 4 mg via INTRAVENOUS
  Filled 2018-09-03: qty 1

## 2018-09-03 MED ORDER — VANCOMYCIN HCL 10 G IV SOLR
1500.0000 mg | Freq: Once | INTRAVENOUS | Status: AC
  Administered 2018-09-03: 1500 mg via INTRAVENOUS
  Filled 2018-09-03: qty 1500

## 2018-09-03 MED ORDER — ONDANSETRON HCL 4 MG PO TABS
4.0000 mg | ORAL_TABLET | Freq: Four times a day (QID) | ORAL | Status: DC | PRN
  Administered 2018-09-08: 4 mg via ORAL
  Filled 2018-09-03: qty 1

## 2018-09-03 MED ORDER — SODIUM CHLORIDE 0.9 % IV SOLN
INTRAVENOUS | Status: DC
  Administered 2018-09-03 – 2018-09-08 (×7): via INTRAVENOUS

## 2018-09-03 MED ORDER — ONDANSETRON 8 MG PO TBDP: 8.0000 mg | ORAL_TABLET | Freq: Three times a day (TID) | ORAL | 0 refills | Status: DC | PRN

## 2018-09-03 MED ORDER — IOPAMIDOL (ISOVUE-300) INJECTION 61%
100.0000 mL | Freq: Once | INTRAVENOUS | Status: AC | PRN
  Administered 2018-09-03: 100 mL via INTRAVENOUS

## 2018-09-03 MED ORDER — SODIUM CHLORIDE 0.9 % IV SOLN: 2.0000 g | INTRAVENOUS | Status: DC

## 2018-09-03 MED ORDER — IPRATROPIUM-ALBUTEROL 0.5-2.5 (3) MG/3ML IN SOLN: 3.0000 mL | RESPIRATORY_TRACT | Status: DC | PRN

## 2018-09-03 MED ORDER — DOCUSATE SODIUM 100 MG PO CAPS: 100.0000 mg | ORAL_CAPSULE | Freq: Every day | ORAL | Status: DC | PRN

## 2018-09-03 MED ORDER — ENOXAPARIN SODIUM 40 MG/0.4ML ~~LOC~~ SOLN
40.0000 mg | SUBCUTANEOUS | Status: DC
  Administered 2018-09-04 – 2018-09-08 (×5): 40 mg via SUBCUTANEOUS
  Filled 2018-09-03 (×5): qty 0.4

## 2018-09-03 MED ORDER — SODIUM CHLORIDE 0.9 % IV SOLN
2.0000 g | Freq: Once | INTRAVENOUS | Status: AC
  Administered 2018-09-03: 2 g via INTRAVENOUS
  Filled 2018-09-03: qty 2

## 2018-09-03 MED ORDER — IPRATROPIUM-ALBUTEROL 0.5-2.5 (3) MG/3ML IN SOLN
3.0000 mL | Freq: Four times a day (QID) | RESPIRATORY_TRACT | Status: DC
  Administered 2018-09-03: 3 mL via RESPIRATORY_TRACT
  Filled 2018-09-03: qty 3

## 2018-09-03 MED ORDER — POLYETHYLENE GLYCOL 3350 17 G PO PACK
17.0000 g | PACK | ORAL | Status: DC
  Administered 2018-09-03: 17 g via ORAL
  Filled 2018-09-03 (×2): qty 1

## 2018-09-03 MED ORDER — METOPROLOL SUCCINATE ER 100 MG PO TB24
100.0000 mg | ORAL_TABLET | Freq: Every day | ORAL | Status: DC
  Administered 2018-09-04 – 2018-09-08 (×5): 100 mg via ORAL
  Filled 2018-09-03 (×5): qty 1

## 2018-09-03 MED ORDER — AMLODIPINE BESYLATE 10 MG PO TABS
10.0000 mg | ORAL_TABLET | Freq: Every morning | ORAL | Status: DC
  Administered 2018-09-04 – 2018-09-08 (×5): 10 mg via ORAL
  Filled 2018-09-03 (×5): qty 1

## 2018-09-03 MED ORDER — ONDANSETRON 8 MG PO TBDP
8.0000 mg | ORAL_TABLET | Freq: Once | ORAL | Status: AC
  Administered 2018-09-03: 8 mg via ORAL
  Filled 2018-09-03: qty 1

## 2018-09-03 MED ORDER — PANTOPRAZOLE SODIUM 40 MG PO TBEC
40.0000 mg | DELAYED_RELEASE_TABLET | Freq: Every day | ORAL | Status: DC
  Administered 2018-09-03 – 2018-09-08 (×6): 40 mg via ORAL
  Filled 2018-09-03 (×6): qty 1

## 2018-09-03 MED ORDER — ONDANSETRON HCL 4 MG/2ML IJ SOLN
4.0000 mg | Freq: Once | INTRAMUSCULAR | Status: AC
  Administered 2018-09-03: 4 mg via INTRAVENOUS
  Filled 2018-09-03: qty 2

## 2018-09-03 MED ORDER — IOPAMIDOL (ISOVUE-300) INJECTION 61%
INTRAVENOUS | Status: AC
  Filled 2018-09-03: qty 100

## 2018-09-03 MED ORDER — LIDOCAINE-PRILOCAINE 2.5-2.5 % EX CREA
1.0000 "application " | TOPICAL_CREAM | CUTANEOUS | Status: DC | PRN
  Filled 2018-09-03: qty 5

## 2018-09-03 MED ORDER — ACETAMINOPHEN 325 MG PO TABS
650.0000 mg | ORAL_TABLET | Freq: Four times a day (QID) | ORAL | Status: DC | PRN
  Administered 2018-09-04 – 2018-09-05 (×2): 650 mg via ORAL
  Filled 2018-09-03 (×2): qty 2

## 2018-09-03 MED ORDER — IRBESARTAN 300 MG PO TABS
300.0000 mg | ORAL_TABLET | Freq: Every day | ORAL | Status: DC
  Administered 2018-09-04 – 2018-09-08 (×5): 300 mg via ORAL
  Filled 2018-09-03 (×5): qty 1

## 2018-09-03 MED ORDER — ONDANSETRON HCL 4 MG/2ML IJ SOLN
4.0000 mg | Freq: Four times a day (QID) | INTRAMUSCULAR | Status: DC | PRN
  Administered 2018-09-03 – 2018-09-08 (×9): 4 mg via INTRAVENOUS
  Filled 2018-09-03 (×9): qty 2

## 2018-09-03 MED ORDER — VANCOMYCIN HCL IN DEXTROSE 1-5 GM/200ML-% IV SOLN
1000.0000 mg | INTRAVENOUS | Status: DC
  Administered 2018-09-04: 1000 mg via INTRAVENOUS
  Filled 2018-09-03: qty 200

## 2018-09-03 MED ORDER — SENNOSIDES-DOCUSATE SODIUM 8.6-50 MG PO TABS
1.0000 | ORAL_TABLET | Freq: Every day | ORAL | Status: DC | PRN
  Administered 2018-09-04: 1 via ORAL
  Filled 2018-09-03: qty 1

## 2018-09-03 MED ORDER — FERROUS SULFATE 325 (65 FE) MG PO TABS
325.0000 mg | ORAL_TABLET | Freq: Every day | ORAL | Status: DC
  Administered 2018-09-04 – 2018-09-08 (×5): 325 mg via ORAL
  Filled 2018-09-03 (×5): qty 1

## 2018-09-03 MED ORDER — ACETAMINOPHEN 325 MG PO TABS
650.0000 mg | ORAL_TABLET | Freq: Once | ORAL | Status: AC
  Administered 2018-09-03: 650 mg via ORAL
  Filled 2018-09-03: qty 2

## 2018-09-03 NOTE — Discharge Instructions (Addendum)
We saw you in the ER for the weakness and nausea. All the results in the ER are normal. We are not sure what is causing your symptoms, we suspect that your symptoms are because of the cancer or the chemotherapy. The workup in the ER is not complete, and is limited to screening for life threatening and emergent conditions only, so please see a primary care doctor for further evaluation.  Please return to the ER if your symptoms worsen; you have increased pain, fevers, chills, inability to keep any medications down, confusion. Otherwise see the outpatient doctor as requested.

## 2018-09-03 NOTE — ED Notes (Signed)
ED TO INPATIENT HANDOFF REPORT  Name/Age/Gender Faith [name] 67 y.o. female  Code Status Code Status History    Date Active Date Inactive Code Status Order ID Comments User Context   11/25/2016 0057 11/26/2016 1630 Full Code [id number]  Etta Quill, DO ED      Home/SNF/Other Home  Chief Complaint ca pt/chemo pt -fever  Level of Care/Admitting Diagnosis ED Disposition    ED Disposition Condition [place] [facility] Area: [facility] [[id number]]  Level of Care: Telemetry [5]  Admit to tele based on following criteria: Other see comments  Comments: Sepsis  Diagnosis: Sepsis [facility]) [[id number]]  Admitting Physician: Harvie Bridge [[id number]]  Attending Physician: Sherron Monday  Estimated length of stay: past midnight tomorrow  Certification:: I certify this patient will need inpatient services for at least 2 midnights  PT Class (Do Not Modify): Inpatient [101]  PT Acc Code (Do Not Modify): Private [1]       Medical History Past Medical History:  Diagnosis Date  . Anemia due to acute blood loss 11/24/2016   TRANSFUSED PRBC X2 UNITS 11-25-2016  . Bladder neoplasm   . Cancer ([place])   . Constipation   . Gross hematuria   . Hypertension   . Urgency of urination     Allergies Allergies  Allergen Reactions  . Tramadol Nausea And Vomiting    IV Location/Drains/Wounds Patient Lines/Drains/Airways Status   Active Line/Drains/Airways    Name:   Placement date:   Placement time:   Site:   Days:   Peripheral IV 09/03/18 Right Antecubital   09/03/18    1430    Antecubital   less than 1   Urethral Catheter Dr. Junious Silk Latex 18 Fr.   12/28/16    1040    Latex   614   Ureteral Drain/Stent Right ureter 6 Fr.   12/28/16    1024    Right ureter   614   Incision (Closed) 12/28/16 Perineum Other (Comment)   12/28/16    1026     614          Labs/Imaging Results for orders placed or performed during the hospital encounter of  09/03/18 (from the past 48 hour(s))  CBC with Differential/Platelet     Status: Abnormal   Collection Time: 09/03/18  2:31 PM  Result Value Ref Range   WBC 13.8 (H) 4.0 - 10.5 K/uL   RBC 4.79 3.87 - 5.11 MIL/uL   Hemoglobin 12.9 12.0 - 15.0 g/dL   HCT 37.8 36.0 - 46.0 %   MCV 78.9 78.0 - 100.0 fL   MCH 26.9 26.0 - 34.0 pg   MCHC 34.1 30.0 - 36.0 g/dL   RDW 12.7 11.5 - 15.5 %   Platelets 284 150 - 400 K/uL   Neutrophils Relative % 96 %   Neutro Abs 13.3 (H) 1.7 - 7.7 K/uL   Lymphocytes Relative 3 %   Lymphs Abs 0.4 (L) 0.7 - 4.0 K/uL   Monocytes Relative 1 %   Monocytes Absolute 0.1 0.1 - 1.0 K/uL   Eosinophils Relative 0 %   Eosinophils Absolute 0.0 0.0 - 0.7 K/uL   Basophils Relative 0 %   Basophils Absolute 0.0 0.0 - 0.1 K/uL    Comment: Performed at [facility], [place] [address], [place], [place] [address]  Comprehensive metabolic panel     Status: Abnormal   Collection Time: 09/03/18  2:31 PM  Result Value Ref Range  Sodium 126 (L) 135 - 145 mmol/L   Potassium 3.6 3.5 - 5.1 mmol/L   Chloride 88 (L) 98 - 111 mmol/L   CO2 28 22 - 32 mmol/L   Glucose, Bld 169 (H) 70 - 99 mg/dL   BUN 23 8 - 23 mg/dL   Creatinine, Ser 1.12 (H) 0.44 - 1.00 mg/dL   Calcium 9.9 8.9 - 10.3 mg/dL   Total Protein 7.7 6.5 - 8.1 g/dL   Albumin 3.3 (L) 3.5 - 5.0 g/dL   AST 22 15 - 41 U/L   ALT 14 0 - 44 U/L   Alkaline Phosphatase 82 38 - 126 U/L   Total Bilirubin 1.0 0.3 - 1.2 mg/dL   GFR calc non Af Amer 50 (L) >60 mL/min   GFR calc Af Amer 58 (L) >60 mL/min    Comment: (NOTE) The eGFR has been calculated using the CKD EPI equation. This calculation has not been validated in all clinical situations. eGFR's persistently <60 mL/min signify possible Chronic Kidney Disease.    Anion gap 10 5 - 15    Comment: Performed at [facility], [place] [address], [place], [place] [address]  Lipase, blood     Status: None   Collection Time: 09/03/18  2:31 PM  Result  Value Ref Range   Lipase 39 11 - 51 U/L    Comment: Performed at [name], [place] [address] [place], [place] [address]  I-Stat CG4 Lactic Acid, ED     Status: None   Collection Time: 09/03/18  4:40 PM  Result Value Ref Range   Lactic Acid, Venous 1.25 0.5 - 1.9 mmol/L  Urinalysis, Routine w reflex microscopic     Status: Abnormal   Collection Time: 09/03/18  5:54 PM  Result Value Ref Range   Color, Urine STRAW (A) YELLOW   APPearance CLEAR CLEAR   Specific Gravity, Urine 1.021 1.005 - 1.030   pH 7.0 5.0 - 8.0   Glucose, UA NEGATIVE NEGATIVE mg/dL   Hgb urine dipstick SMALL (A) NEGATIVE   Bilirubin Urine NEGATIVE NEGATIVE   Ketones, ur NEGATIVE NEGATIVE mg/dL   Protein, ur NEGATIVE NEGATIVE mg/dL   Nitrite NEGATIVE NEGATIVE   Leukocytes, UA TRACE (A) NEGATIVE   RBC / HPF 0-5 0 - 5 RBC/hpf   WBC, UA 0-5 0 - 5 WBC/hpf   Bacteria, UA NONE SEEN NONE SEEN   Squamous Epithelial / LPF 6-10 0 - 5   Mucus PRESENT     Comment: Performed at [name], [place] [address], [place], [place] [address]   Dg Chest 2 View  Result Date: 09/03/2018 CLINICAL DATA:  Nausea and weakness. Fever. Bladder cancer metastatic to the skeleton, lungs, and mediastinum. Recent initiation of radiation therapy 2 days ago. Chemotherapy starting 1 day ago. EXAM: CHEST - 2 VIEW COMPARISON:  Chest radiograph from 09/02/2018 FINDINGS: Power injectable right Port-A-Cath tip: SVC. Atherosclerotic calcification of the aortic arch. The patient is rotated to the right on today's radiograph, reducing diagnostic sensitivity and specificity. Bandlike density in the lingula likely from atelectasis or scarring, but this had a hypermetabolic component on the PET-CT suggesting at least a part of this is due to a metastatic nodule. The right lung appears clear although there is a known hypermetabolic pulmonary nodule medially at the right lung base in the retro diaphragmatic region which is not well  seen by conventional radiography. Patient has a known sternal metastatic lesion, not well seen on today's chest radiograph.  There is some minimal blunting of the left posterior costophrenic angle and possibly the right posterior costophrenic angle. Mild enlargement of the cardiopericardial silhouette, without edema. The patient has known extensive thoracic spine metastatic disease which is poorly apparent on today's chest radiographs. IMPRESSION: 1. Stable mild cardiomegaly, without edema. 2. Mild lingular atelectasis with a known underlying hypermetabolic pulmonary nodule in this vicinity. 3. Other nodules at the right lung base and bony involvement in the sternum and thoracic spine are not readily apparent on conventional radiography. 4. Blunting of the posterior costophrenic angle suggesting small pleural effusions. 5.  Aortic Atherosclerosis (ICD10-I70.0). Electronically Signed   By: Van Clines M.D.   On: 09/03/2018 16:16   Ct Abdomen Pelvis W Contrast  Result Date: 09/03/2018 CLINICAL DATA:  History of bladder cancer. The patient is undergoing chemotherapy and radiation therapy. Nausea and vomiting. EXAM: CT ABDOMEN AND PELVIS WITH CONTRAST TECHNIQUE: Multidetector CT imaging of the abdomen and pelvis was performed using the standard protocol following bolus administration of intravenous contrast. CONTRAST:  141m ISOVUE-300 IOPAMIDOL (ISOVUE-300) INJECTION 61% COMPARISON:  Body CT 07/21/2018 FINDINGS: Lower chest: Right lower lobe pulmonary nodule measures 1.5 cm, stable. Subpleural nodularity in the left lower lobe, mainly 12 mm soft tissue nodule along the medial lower lobe pleura, and several soft tissue nodules along the inferolateral left lower lobe pleura. Small left pleural effusion. Small hiatal hernia. Hepatobiliary: Subtle 5 mm area of hypoattenuation in the inferior aspect of the right lobe of the liver, image 28/85, sequence 2. No gallstones, gallbladder wall thickening, or biliary  dilatation. Pancreas: Unremarkable. No pancreatic ductal dilatation or surrounding inflammatory changes. Spleen: Normal in size without focal abnormality. Adrenals/Urinary Tract: Normal appearance of the adrenal glands. Benign-appearing 4.2 cm left renal cyst. 6 mm hypoattenuated nodule in the superior cortex of the right kidney, too small to be actually characterize. Stable appearance of the bladder. Stomach/Bowel: Stomach is within normal limits. No evidence of appendicitis. No evidence of bowel wall thickening, distention, or inflammatory changes. Vascular/Lymphatic: Aortic atherosclerosis. No enlarged abdominal or pelvic lymph nodes. Reproductive: Uterus and bilateral adnexa are unremarkable. Other: No abdominal wall hernia or abnormality. No abdominopelvic ascites. Musculoskeletal: Interval increase in the soft tissue component of the lytic lesions of the L2 spinous process and left lateral process with extension to the lamina and pedicle. New lytic lesion within the inferior aspect of T12 vertebral body. Increase lytic lesion within the superior endplate of L1 vertebral body with interval development of compression deformity of the superior endplate. Enlarging lytic lesions within the anterior aspect of the L3 and L4 vertebral bodies. IMPRESSION: Worsening pulmonary metastatic disease. Mainly, interval development of suspicious subpleural nodules in the left lower lobe and lingula. Small left pleural effusion. Stable right lower lobe pulmonary nodule. Worsening skeletal metastatic disease. Interval increase in the soft tissue component of the lytic lesions of the L2 spinous process and left lateral process with extension to the lamina and pedicle. New lytic lesion within the inferior aspect of T12 vertebral body. Increase lytic lesion within the superior endplate of L1 vertebral body with interval development of compression fracture of the superior endplate. Enlarging lytic lesions within the anterior aspect  of the L3 and L4 vertebral bodies. Subtle 5 mm area of hypoattenuation in the inferior aspect of right lobe of the liver, too small to be actually characterize. Small hiatal hernia. Electronically Signed   By: DFidela SalisburyM.D.   On: 09/03/2018 16:58   Dg Chest Port 1 View  Result Date:  09/02/2018 CLINICAL DATA:  Hypotension. EXAM: PORTABLE CHEST 1 VIEW COMPARISON:  07/17/2018 and prior radiographs.  07/24/2018 CT. FINDINGS: The cardiomediastinal silhouette is unchanged. A RIGHT IJ Port-A-Cath is noted with tip overlying the mid SVC. RIGHT basilar opacity/atelectasis/scarring again noted. No new pulmonary opacities are identified. No definite pleural effusion or pneumothorax. IMPRESSION: No acute abnormality. Electronically Signed   By: Margarette Canada M.D.   On: 09/02/2018 23:48    Pending Labs Unresulted Labs (From admission, onward)    Start     Ordered   09/03/18 1733  Urine culture  STAT,   STAT     09/03/18 1732   09/03/18 1534  Blood culture (routine x 2)  BLOOD CULTURE X 2,   STAT     09/03/18 1534   Signed and Held  CBC  (enoxaparin (LOVENOX)    CrCl >/= 30 ml/min)  Once,   R    Comments:  Baseline for enoxaparin therapy IF NOT ALREADY DRAWN.  Notify MD if PLT < 100 K.    Signed and Held   Signed and Held  Creatinine, serum  (enoxaparin (LOVENOX)    CrCl >/= 30 ml/min)  Once,   R    Comments:  Baseline for enoxaparin therapy IF NOT ALREADY DRAWN.    Signed and Held   Signed and Held  Creatinine, serum  (enoxaparin (LOVENOX)    CrCl >/= 30 ml/min)  Weekly,   R    Comments:  while on enoxaparin therapy    Signed and Held   Signed and Held  HIV antibody (Routine Testing)  Once,   R     Signed and Held   Signed and Held  Magnesium  Add-on,   R     Signed and Held   Signed and Held  Phosphorus  Add-on,   R     Signed and Held   Signed and Held  CBC  Tomorrow morning,   R     Signed and Held   Signed and Held  Culture, sputum-assessment  Once,   R    Question:  Patient immune  status  Answer:  Immunocompromised   Signed and Held   Signed and Held  Comprehensive metabolic panel  Tomorrow morning,   R     Signed and Held          Vitals/Pain Today's Vitals   09/03/18 1757 09/03/18 1813 09/03/18 1841 09/03/18 1848  BP: (!) 155/81     Pulse: 76     Resp: 16     Temp:      TempSrc:      SpO2: 96%     PainSc:  4  0-No pain 0-No pain    Isolation Precautions No active isolations  Medications Medications  iopamidol (ISOVUE-300) 61 % injection (has no administration in time range)  vancomycin (VANCOCIN) 1,500 mg in sodium chloride 0.9 % 500 mL IVPB (1,500 mg Intravenous New Bag/Given 09/03/18 1853)  ondansetron (ZOFRAN) injection 4 mg (4 mg Intravenous Given 09/03/18 1443)  morphine 4 MG/ML injection 4 mg (4 mg Intravenous Given 09/03/18 1443)  sodium chloride 0.9 % bolus 1,000 mL (0 mLs Intravenous Stopped 09/03/18 1744)  acetaminophen (TYLENOL) tablet 650 mg (650 mg Oral Given 09/03/18 1648)  iopamidol (ISOVUE-300) 61 % injection 100 mL (100 mLs Intravenous Contrast Given 09/03/18 1611)  morphine 4 MG/ML injection 4 mg (4 mg Intravenous Given 09/03/18 1815)  ceFEPIme (MAXIPIME) 2 g in sodium chloride 0.9 % 100 mL IVPB (0 g  Intravenous Stopped 09/03/18 1849)    Mobility walks

## 2018-09-03 NOTE — ED Triage Notes (Signed)
Pt returns to ED after d/c today with continued nausea and new fever. Was d/c and told to return for fever, pt continues with abdominal pain.

## 2018-09-03 NOTE — ED Notes (Signed)
Pt given ginger ale and saltine crackers for PO challenge

## 2018-09-03 NOTE — ED Provider Notes (Addendum)
[place] DEPT Provider Note   CSN: [id number] Arrival date & time: 09/03/18  1201     History   Chief Complaint Chief Complaint  Patient presents with  . Fever  . Nausea    HPI Faith [name] is a 67 y.o. female.  The history is provided by the patient and medical records. No language interpreter was used.  Fever       67 year old female with no history of bladder cancer with metastasis to her spine and lung presenting for evaluation of fever and nausea. The patient just recently initiated radiation therapy 3 days ago and chemotherapy 2 days ago.  She has been endorsing nausea for the past few days along with generalized weakness and decrease in appetite.  Report having pain to her back and abdomen.  She report feeling constipated and haven't had a bowel movement in 4 days.  She is able to pass flatus.  She describes abdominal pain as achy throbbing.  She was seen and evaluated in the ED last night for her symptoms.  It was felt that her pain is related to her cancer pain.  She was treated symptomatically and sent home.  She now endorsed having fever of 100.9 at home and return for further care.  No complaints of cough or dysuria.  Past Medical History:  Diagnosis Date  . Anemia due to acute blood loss 11/24/2016   TRANSFUSED PRBC X2 UNITS 11-25-2016  . Bladder neoplasm   . Cancer ([place])   . Constipation   . Gross hematuria   . Hypertension   . Urgency of urination     Patient Active Problem List   Diagnosis Date Noted  . Goals of care, counseling/discussion 08/18/2018  . Urothelial carcinoma of bladder ([place]) 07/28/2018  . Acute blood loss anemia 11/25/2016  . HTN (hypertension) 11/25/2016  . Gross hematuria 11/25/2016    Past Surgical History:  Procedure Laterality Date  . CYSTOSCOPY W/ URETERAL STENT PLACEMENT Right 12/28/2016   Procedure: CYSTOSCOPY WITH RIGHT RETROGRADE URETERAL STENT PLACEMENT;  Surgeon: Festus Aloe, MD;   Location: [facility];  Service: Urology;  Laterality: Right;  . IR IMAGING GUIDED PORT INSERTION  08/30/2018  . TRANSURETHRAL RESECTION OF BLADDER TUMOR WITH MITOMYCIN-C N/A 12/28/2016   Procedure: TRANSURETHRAL RESECTION OF BLADDER TUMOR GREATER THAN 5 CM;  Surgeon: Festus Aloe, MD;  Location: [facility];  Service: Urology;  Laterality: N/A;  . TUBAL LIGATION Bilateral 1990's     OB History   None      Home Medications    Prior to Admission medications   Medication Sig Start Date End Date Taking? Authorizing Provider  acetaminophen (TYLENOL) 325 MG tablet Take 650 mg by mouth every 6 (six) hours as needed for mild pain, moderate pain or headache.    [provider]  amLODipine (NORVASC) 10 MG tablet Take 1 tablet (10 mg total) by mouth every morning. 03/02/17   Robyn Haber, MD  Docusate Calcium (STOOL SOFTENER PO) Take 2 tablets by mouth daily as needed (constipation).    [provider]  ferrous sulfate 325 (65 FE) MG tablet Start taking only after constipation is relieved. Take 1 tablet once daily for 1 week, then 1 tablet twice daily for 1 week and then 1 tablet three times daily. Patient taking differently: Take 325 mg by mouth daily.  11/26/16   Bonnielee Haff, MD  hydrochlorothiazide (HYDRODIURIL) 25 MG tablet Take 25 mg by mouth daily.  08/13/18  [provider]  lidocaine-prilocaine (EMLA) cream Apply 1 application topically as needed. 08/18/18   Wyatt Portela, MD  metoprolol succinate (TOPROL-XL) 100 MG 24 hr tablet Take 100 mg by mouth daily. 07/04/18   [provider]  montelukast (SINGULAIR) 10 MG tablet Take 10 mg by mouth daily. 07/14/18   [provider]  olmesartan (BENICAR) 40 MG tablet Take 40 mg by mouth daily. 05/19/18   [provider]  omeprazole (PRILOSEC) 40 MG capsule Take 40 mg by mouth daily. 07/03/18   [provider]  ondansetron (ZOFRAN ODT) 8 MG disintegrating  tablet Take 1 tablet (8 mg total) by mouth every 8 (eight) hours as needed for nausea. 09/03/18   Varney Biles, MD  polyethylene glycol (MIRALAX / GLYCOLAX) packet Take 17 g by mouth every other day. 03/02/17   Robyn Haber, MD  prochlorperazine (COMPAZINE) 10 MG tablet Take 1 tablet (10 mg total) by mouth every 6 (six) hours as needed for nausea or vomiting. 08/18/18   Wyatt Portela, MD  rosuvastatin (CRESTOR) 5 MG tablet Take 5 mg by mouth daily. 07/05/18   [provider]  senna-docusate (SENOKOT-S) 8.6-50 MG tablet Take 1 tablet by mouth daily as needed. Patient taking differently: Take 1 tablet by mouth daily as needed for mild constipation or moderate constipation.  03/02/17   Robyn Haber, MD  traMADol (ULTRAM) 50 MG tablet Take 1 tablet (50 mg total) by mouth every 6 (six) hours as needed. Patient not taking: Reported on 09/02/2018 12/28/16   Festus Aloe, MD    Family History Family History  Problem Relation Age of Onset  . Leukemia Father     Social History Social History   Tobacco Use  . Smoking status: Never Smoker  . Smokeless tobacco: Never Used  Substance Use Topics  . Alcohol use: No  . Drug use: No     Allergies   Tramadol   Review of Systems Review of Systems  Constitutional: Positive for fever.  All other systems reviewed and are negative.    Physical Exam Updated Vital Signs BP (!) 142/79   Pulse 96   Temp 98.8 F (37.1 C) (Oral)   Resp 13   SpO2 94%   Physical Exam  Constitutional: She appears well-developed and well-nourished. No distress.  HENT:  Head: Atraumatic.  Eyes: Conjunctivae are normal.  Neck: Neck supple.  Cardiovascular: Normal rate and regular rhythm.  Pulmonary/Chest: Effort normal and breath sounds normal.  Abdominal: Soft. Bowel sounds are normal. She exhibits no distension. There is tenderness (Mild diffuse abdominal tenderness without guarding or rebound tenderness).  Genitourinary:  Genitourinary  Comments: Chaperone present during exam.  Normal rectal tone, no obvious mass, no stool impaction, normal color stool on glove.  Neurological: She is alert.  Skin: No rash noted.  Psychiatric: She has a normal mood and affect.  Nursing note and vitals reviewed.    ED Treatments / Results  Labs (all labs ordered are listed, but only abnormal results are displayed) Labs Reviewed  CBC WITH DIFFERENTIAL/PLATELET - Abnormal; Notable for the following components:      Result Value   WBC 13.8 (*)    Neutro Abs 13.3 (*)    Lymphs Abs 0.4 (*)    All other components within normal limits  COMPREHENSIVE METABOLIC PANEL - Abnormal; Notable for the following components:   Sodium 126 (*)    Chloride 88 (*)    Glucose, Bld 169 (*)    Creatinine, Ser 1.12 (*)  Albumin 3.3 (*)    GFR calc non Af Amer 50 (*)    GFR calc Af Amer 58 (*)    All other components within normal limits  URINALYSIS, ROUTINE W REFLEX MICROSCOPIC - Abnormal; Notable for the following components:   Color, Urine STRAW (*)    Hgb urine dipstick SMALL (*)    Leukocytes, UA TRACE (*)    All other components within normal limits  CULTURE, BLOOD (ROUTINE X 2)  CULTURE, BLOOD (ROUTINE X 2)  URINE CULTURE  LIPASE, BLOOD  I-STAT CG4 LACTIC ACID, ED    EKG None  Radiology Dg Chest 2 View  Result Date: 09/03/2018 CLINICAL DATA:  Nausea and weakness. Fever. Bladder cancer metastatic to the skeleton, lungs, and mediastinum. Recent initiation of radiation therapy 2 days ago. Chemotherapy starting 1 day ago. EXAM: CHEST - 2 VIEW COMPARISON:  Chest radiograph from 09/02/2018 FINDINGS: Power injectable right Port-A-Cath tip: SVC. Atherosclerotic calcification of the aortic arch. The patient is rotated to the right on today's radiograph, reducing diagnostic sensitivity and specificity. Bandlike density in the lingula likely from atelectasis or scarring, but this had a hypermetabolic component on the PET-CT suggesting at least a part  of this is due to a metastatic nodule. The right lung appears clear although there is a known hypermetabolic pulmonary nodule medially at the right lung base in the retro diaphragmatic region which is not well seen by conventional radiography. Patient has a known sternal metastatic lesion, not well seen on today's chest radiograph. There is some minimal blunting of the left posterior costophrenic angle and possibly the right posterior costophrenic angle. Mild enlargement of the cardiopericardial silhouette, without edema. The patient has known extensive thoracic spine metastatic disease which is poorly apparent on today's chest radiographs. IMPRESSION: 1. Stable mild cardiomegaly, without edema. 2. Mild lingular atelectasis with a known underlying hypermetabolic pulmonary nodule in this vicinity. 3. Other nodules at the right lung base and bony involvement in the sternum and thoracic spine are not readily apparent on conventional radiography. 4. Blunting of the posterior costophrenic angle suggesting small pleural effusions. 5.  Aortic Atherosclerosis (ICD10-I70.0). Electronically Signed   By: Van Clines M.D.   On: 09/03/2018 16:16   Ct Abdomen Pelvis W Contrast  Result Date: 09/03/2018 CLINICAL DATA:  History of bladder cancer. The patient is undergoing chemotherapy and radiation therapy. Nausea and vomiting. EXAM: CT ABDOMEN AND PELVIS WITH CONTRAST TECHNIQUE: Multidetector CT imaging of the abdomen and pelvis was performed using the standard protocol following bolus administration of intravenous contrast. CONTRAST:  180mL ISOVUE-300 IOPAMIDOL (ISOVUE-300) INJECTION 61% COMPARISON:  Body CT 07/21/2018 FINDINGS: Lower chest: Right lower lobe pulmonary nodule measures 1.5 cm, stable. Subpleural nodularity in the left lower lobe, mainly 12 mm soft tissue nodule along the medial lower lobe pleura, and several soft tissue nodules along the inferolateral left lower lobe pleura. Small left pleural effusion.  Small hiatal hernia. Hepatobiliary: Subtle 5 mm area of hypoattenuation in the inferior aspect of the right lobe of the liver, image 28/85, sequence 2. No gallstones, gallbladder wall thickening, or biliary dilatation. Pancreas: Unremarkable. No pancreatic ductal dilatation or surrounding inflammatory changes. Spleen: Normal in size without focal abnormality. Adrenals/Urinary Tract: Normal appearance of the adrenal glands. Benign-appearing 4.2 cm left renal cyst. 6 mm hypoattenuated nodule in the superior cortex of the right kidney, too small to be actually characterize. Stable appearance of the bladder. Stomach/Bowel: Stomach is within normal limits. No evidence of appendicitis. No evidence of bowel wall thickening, distention,  or inflammatory changes. Vascular/Lymphatic: Aortic atherosclerosis. No enlarged abdominal or pelvic lymph nodes. Reproductive: Uterus and bilateral adnexa are unremarkable. Other: No abdominal wall hernia or abnormality. No abdominopelvic ascites. Musculoskeletal: Interval increase in the soft tissue component of the lytic lesions of the L2 spinous process and left lateral process with extension to the lamina and pedicle. New lytic lesion within the inferior aspect of T12 vertebral body. Increase lytic lesion within the superior endplate of L1 vertebral body with interval development of compression deformity of the superior endplate. Enlarging lytic lesions within the anterior aspect of the L3 and L4 vertebral bodies. IMPRESSION: Worsening pulmonary metastatic disease. Mainly, interval development of suspicious subpleural nodules in the left lower lobe and lingula. Small left pleural effusion. Stable right lower lobe pulmonary nodule. Worsening skeletal metastatic disease. Interval increase in the soft tissue component of the lytic lesions of the L2 spinous process and left lateral process with extension to the lamina and pedicle. New lytic lesion within the inferior aspect of T12 vertebral  body. Increase lytic lesion within the superior endplate of L1 vertebral body with interval development of compression fracture of the superior endplate. Enlarging lytic lesions within the anterior aspect of the L3 and L4 vertebral bodies. Subtle 5 mm area of hypoattenuation in the inferior aspect of right lobe of the liver, too small to be actually characterize. Small hiatal hernia. Electronically Signed   By: Fidela Salisbury M.D.   On: 09/03/2018 16:58   Dg Chest Port 1 View  Result Date: 09/02/2018 CLINICAL DATA:  Hypotension. EXAM: PORTABLE CHEST 1 VIEW COMPARISON:  07/17/2018 and prior radiographs.  07/24/2018 CT. FINDINGS: The cardiomediastinal silhouette is unchanged. A RIGHT IJ Port-A-Cath is noted with tip overlying the mid SVC. RIGHT basilar opacity/atelectasis/scarring again noted. No new pulmonary opacities are identified. No definite pleural effusion or pneumothorax. IMPRESSION: No acute abnormality. Electronically Signed   By: Margarette Canada M.D.   On: 09/02/2018 23:48    Procedures Procedures (including critical care time)  Medications Ordered in ED Medications  iopamidol (ISOVUE-300) 61 % injection (has no administration in time range)  vancomycin (VANCOCIN) 1,500 mg in sodium chloride 0.9 % 500 mL IVPB (has no administration in time range)  ceFEPIme (MAXIPIME) 2 g in sodium chloride 0.9 % 100 mL IVPB (2 g Intravenous New Bag/Given 09/03/18 1814)  ondansetron (ZOFRAN) injection 4 mg (4 mg Intravenous Given 09/03/18 1443)  morphine 4 MG/ML injection 4 mg (4 mg Intravenous Given 09/03/18 1443)  sodium chloride 0.9 % bolus 1,000 mL (0 mLs Intravenous Stopped 09/03/18 1744)  acetaminophen (TYLENOL) tablet 650 mg (650 mg Oral Given 09/03/18 1648)  iopamidol (ISOVUE-300) 61 % injection 100 mL (100 mLs Intravenous Contrast Given 09/03/18 1611)  morphine 4 MG/ML injection 4 mg (4 mg Intravenous Given 09/03/18 1815)     Initial Impression / Assessment and Plan / ED Course  I have reviewed the  triage vital signs and the nursing notes.  Pertinent labs & imaging results that were available during my care of the patient were reviewed by me and considered in my medical decision making (see chart for details).     BP (!) 155/81 (BP Location: Left Arm)   Pulse 76   Temp (!) 101.1 F (38.4 C) (Rectal)   Resp 16   SpO2 96%    Final Clinical Impressions(s) / ED Diagnoses   Final diagnoses:  Fever, unspecified fever cause  Bladder cancer metastasized to bone [facility])    ED Discharge Orders    None  2:30 PM Patient with metastatic bladder cancer is having decrease in appetite, nausea and now report of having fever.  She was seen earlier this morning for same and was treated symptomatically.  She did not complain of fever last night.  3:37 PM Rectal temp is 101.1, she is not hypotensive, no tachypnea and no tachycardia.  Will check lactic acid and blood culture, will obtain abd/pelvis CT.    5:17 PM Elevated white count 13.8 with normal lactic acid.  Chest x-ray without evidence of pneumonia.  Abdominal pelvis CT scan showed worsening pulmonary metastatic disease as well has worsened skeletal metastatic disease.  New liver lesions within the inferior aspect of T12 vertebral body, increasing skin lesion in the superior endplate of L1 vertebral body, large lytic lesions within the anterior aspect of the L3 or L4 vertebral body.  5:37 PM We will initiate broad-spectrum antibiotic.  Will consult oncology for an update and patient will be admitted for further management of her condition. Pt does have a Port-A-Cath placed 4 days prior. Oncologist is Dr. Alen Blew.  Currently taking Gemzar Cisplatin.  No headache or confusion concerning for meningitis.   5:45 PM Appreciate consultation with oncall oncologist Dr. Irene Limbo who will notify Dr. Alen Blew to be involve in pt care.  Will consult medicine for admission.  6:25 PM Appreciate consultation from [facility] Dr. Ara Kussmaul who agrees  to see and admit pt for further care.    Domenic Moras, PA-C 09/03/18 1827    Domenic Moras, PA-C 09/03/18 Penni Bombard    Sherwood Gambler, MD 09/03/18 [phone]

## 2018-09-03 NOTE — Progress Notes (Signed)
A consult was received from an ED physician for vancomycin and cefepime per pharmacy dosing.  The patient's profile has been reviewed for ht/wt/allergies/indication/available labs.   A one time order has been placed for vancomycin 1500 mg and cefepime 2 gm.    Further antibiotics/pharmacy consults should be ordered by admitting physician if indicated.                       Thank you, Eudelia Bunch, Pharm.D [phone] 09/03/2018 6:05 PM

## 2018-09-03 NOTE — H&P (Signed)
History and Physical   [facility] - [place] @ [address] [place] Admission History and Physical [employer], D.O.    Patient Name: Faith [name] MR#: [id number] Date of Birth: January 01, 1951 Date of Admission: 09/03/2018  Referring MD/NP/PA: PA Rona Ravens Primary Care Physician: Patient, No Pcp Per  Chief Complaint:  Chief Complaint  Patient presents with  . Fever  . Nausea    HPI: Faith [name] is a 67 y.o. female with a known history of hypertension, anemia bladder cancer with metastases to her lung and spine, recently diagnosed and started radiation and chemotherapy this past week presents to the emergency department for evaluation of generalized weakness, nausea, abdominal pain, back pain and constipation.  Patient was in a usual state of health until last week when she started chemotherapy and radiation she reports the onset of constipation associated with abdominal pain, nausea and generalized weakness.  She was seen in the emergency department 1 day ago and was discharged home.  She returns tonight with a fever T-max of 100.9 without any other focal complaints.  Patient denies cough, sore throat, diarrhea, dysuria.. dizziness, chest pain, shortness of breath, N/V/C/D, abdominal pain, dysuria/frequency, changes in mental status.     EMS/ED Course: Patient received cefepime, Vanco. Medical admission has been requested for further management of abscess, unclear source in an immunocompromised patient, hyponatremia  Review of Systems:  CONSTITUTIONAL: Positive fever/chills, fatigue, weakness, negative for weight gain/loss, headache. EYES: No blurry or double vision. ENT: No tinnitus, postnasal drip, redness or soreness of the oropharynx. RESPIRATORY: No cough, dyspnea, wheeze.  No hemoptysis.  CARDIOVASCULAR: No chest pain, palpitations, syncope, orthopnea. No lower extremity edema.  GASTROINTESTINAL: Positive abdominal pain, constipation, nausea. Negative vomiting, diarrhea.  No  hematemesis, melena or hematochezia. GENITOURINARY: No dysuria, frequency, hematuria. ENDOCRINE: No polyuria or nocturia. No heat or cold intolerance. HEMATOLOGY: No anemia, bruising, bleeding. INTEGUMENTARY: No rashes, ulcers, lesions. MUSCULOSKELETAL: No arthritis, gout. NEUROLOGIC: No numbness, tingling, ataxia, seizure-type activity, weakness. PSYCHIATRIC: No anxiety, depression, insomnia.   Past Medical History:  Diagnosis Date  . Anemia due to acute blood loss 11/24/2016   TRANSFUSED PRBC X2 UNITS 11-25-2016  . Bladder neoplasm   . Cancer ([place])   . Constipation   . Gross hematuria   . Hypertension   . Urgency of urination     Past Surgical History:  Procedure Laterality Date  . CYSTOSCOPY W/ URETERAL STENT PLACEMENT Right 12/28/2016   Procedure: CYSTOSCOPY WITH RIGHT RETROGRADE URETERAL STENT PLACEMENT;  Surgeon: Festus Aloe, MD;  Location: [facility];  Service: Urology;  Laterality: Right;  . IR IMAGING GUIDED PORT INSERTION  08/30/2018  . TRANSURETHRAL RESECTION OF BLADDER TUMOR WITH MITOMYCIN-C N/A 12/28/2016   Procedure: TRANSURETHRAL RESECTION OF BLADDER TUMOR GREATER THAN 5 CM;  Surgeon: Festus Aloe, MD;  Location: [facility];  Service: Urology;  Laterality: N/A;  . TUBAL LIGATION Bilateral 1990's     reports that she has never smoked. She has never used smokeless tobacco. She reports that she does not drink alcohol or use drugs.  Allergies  Allergen Reactions  . Tramadol Nausea And Vomiting    Family History  Problem Relation Age of Onset  . Leukemia Father     Prior to Admission medications   Medication Sig Start Date End Date Taking? Authorizing Provider  acetaminophen (TYLENOL) 325 MG tablet Take 650 mg by mouth every 6 (six) hours as needed for mild pain, moderate pain or headache.   Yes [provider]  amLODipine (NORVASC)  10 MG tablet Take 1 tablet (10 mg total) by mouth every morning. 03/02/17  Yes  Robyn Haber, MD  Docusate Calcium (STOOL SOFTENER PO) Take 2 tablets by mouth daily as needed (constipation).   Yes [provider]  ferrous sulfate 325 (65 FE) MG tablet Start taking only after constipation is relieved. Take 1 tablet once daily for 1 week, then 1 tablet twice daily for 1 week and then 1 tablet three times daily. Patient taking differently: Take 325 mg by mouth daily.  11/26/16  Yes Bonnielee Haff, MD  hydrochlorothiazide (HYDRODIURIL) 25 MG tablet Take 25 mg by mouth daily.  08/13/18  Yes [provider]  lidocaine-prilocaine (EMLA) cream Apply 1 application topically as needed. 08/18/18  Yes Wyatt Portela, MD  metoprolol succinate (TOPROL-XL) 100 MG 24 hr tablet Take 100 mg by mouth daily. 07/04/18  Yes [provider]  montelukast (SINGULAIR) 10 MG tablet Take 10 mg by mouth daily. 07/14/18  Yes [provider]  olmesartan (BENICAR) 40 MG tablet Take 40 mg by mouth daily. 05/19/18  Yes [provider]  omeprazole (PRILOSEC) 40 MG capsule Take 40 mg by mouth daily. 07/03/18  Yes [provider]  ondansetron (ZOFRAN ODT) 8 MG disintegrating tablet Take 1 tablet (8 mg total) by mouth every 8 (eight) hours as needed for nausea. 09/03/18  Yes Nanavati, Ankit, MD  polyethylene glycol (MIRALAX / GLYCOLAX) packet Take 17 g by mouth every other day. 03/02/17  Yes Robyn Haber, MD  prochlorperazine (COMPAZINE) 10 MG tablet Take 1 tablet (10 mg total) by mouth every 6 (six) hours as needed for nausea or vomiting. 08/18/18  Yes Wyatt Portela, MD  rosuvastatin (CRESTOR) 5 MG tablet Take 5 mg by mouth daily. 07/05/18  Yes [provider]  senna-docusate (SENOKOT-S) 8.6-50 MG tablet Take 1 tablet by mouth daily as needed. Patient taking differently: Take 1 tablet by mouth daily as needed for mild constipation or moderate constipation.  03/02/17  Yes Robyn Haber, MD  traMADol (ULTRAM) 50 MG tablet Take 1 tablet (50 mg total) by  mouth every 6 (six) hours as needed. Patient not taking: Reported on 09/02/2018 12/28/16   Festus Aloe, MD    Physical Exam: Vitals:   09/03/18 1340 09/03/18 1435 09/03/18 1633 09/03/18 1757  BP:   (!) 172/77 (!) 155/81  Pulse:   87 76  Resp:   16 16  Temp: 98.8 F (37.1 C) (!) 101.1 F (38.4 C)    TempSrc: Oral Rectal    SpO2:   99% 96%    GENERAL: 67 y.o.-year-old female patient, well-developed, well-nourished lying in the bed in no acute distress.  Pleasant and cooperative.   HEENT: Head atraumatic, normocephalic. Pupils equal. Mucus membranes moist. NECK: Supple. No JVD. CHEST: Normal breath sounds bilaterally. No wheezing, rales, rhonchi or crackles. No use of accessory muscles of respiration.  No reproducible chest wall tenderness.  CARDIOVASCULAR: S1, S2 normal. No murmurs, rubs, or gallops. Cap refill <2 seconds. Pulses intact distally.  ABDOMEN: Soft, nondistended, mild diffuse tenderness no rebound, guarding, rigidity. Normoactive bowel sounds present in all four quadrants.  EXTREMITIES: No pedal edema, cyanosis, or clubbing. No calf tenderness or Homan's sign.  NEUROLOGIC: The patient is alert and oriented x 3. Cranial nerves II through XII are grossly intact with no focal sensorimotor deficit. PSYCHIATRIC:  Normal affect, mood, thought content. SKIN: Warm, dry, and intact without obvious rash, lesion, or ulcer.    Labs on Admission:  CBC: Recent Labs  Lab 08/30/18 1316 08/31/18 0853 09/02/18 2303 09/03/18 1431  WBC 8.5 7.4 11.1* 13.8*  NEUTROABS  --  4.6 9.9* 13.3*  HGB 13.5 13.7 13.3 12.9  HCT 40.5 41.6 39.5 37.8  MCV 80.7 81.4 81.4 78.9  PLT 360 267 311 465   Basic Metabolic Panel: Recent Labs  Lab 08/31/18 0853 09/02/18 2303 09/02/18 2322 09/03/18 1431  NA 137 132*  --  126*  K 4.2 4.0  --  3.6  CL 100 92*  --  88*  CO2 25 27  --  28  GLUCOSE 107* 207*  --  169*  BUN 21 27*  --  23  CREATININE 1.13* 1.17*  --  1.12*  CALCIUM 10.7* 10.6*  --   9.9  MG  --   --  2.0  --    GFR: Estimated Creatinine Clearance: 47.7 mL/min (A) (by C-G formula based on SCr of 1.12 mg/dL (H)). Liver Function Tests: Recent Labs  Lab 08/31/18 0853 09/02/18 2303 09/03/18 1431  AST 17 22 22   ALT 8 16 14   ALKPHOS 95 90 82  BILITOT 0.5 0.9 1.0  PROT 8.4* 8.2* 7.7  ALBUMIN 3.7 3.7 3.3*   Recent Labs  Lab 09/03/18 1431  LIPASE 39   No results for input(s): AMMONIA in the last 168 hours. Coagulation Profile: Recent Labs  Lab 08/30/18 1316  INR 0.98   Cardiac Enzymes: No results for input(s): CKTOTAL, CKMB, CKMBINDEX, TROPONINI in the last 168 hours. BNP (last 3 results) No results for input(s): PROBNP in the last 8760 hours. HbA1C: No results for input(s): HGBA1C in the last 72 hours. CBG: Recent Labs  Lab 09/02/18 2311  GLUCAP 164*   Lipid Profile: No results for input(s): CHOL, HDL, LDLCALC, TRIG, CHOLHDL, LDLDIRECT in the last 72 hours. Thyroid Function Tests: No results for input(s): TSH, T4TOTAL, FREET4, T3FREE, THYROIDAB in the last 72 hours. Anemia Panel: No results for input(s): VITAMINB12, FOLATE, FERRITIN, TIBC, IRON, RETICCTPCT in the last 72 hours. Urine analysis:    Component Value Date/Time   COLORURINE STRAW (A) 09/03/2018 1754   APPEARANCEUR CLEAR 09/03/2018 1754   LABSPEC 1.021 09/03/2018 1754   PHURINE 7.0 09/03/2018 1754   GLUCOSEU NEGATIVE 09/03/2018 1754   HGBUR SMALL (A) 09/03/2018 1754   BILIRUBINUR NEGATIVE 09/03/2018 1754   KETONESUR NEGATIVE 09/03/2018 1754   PROTEINUR NEGATIVE 09/03/2018 1754   NITRITE NEGATIVE 09/03/2018 1754   LEUKOCYTESUR TRACE (A) 09/03/2018 1754   Sepsis Labs: @LABRCNTIP (procalcitonin:4,lacticidven:4) )No results found for this or any previous visit (from the past 240 hour(s)).   Radiological Exams on Admission: Dg Chest 2 View  Result Date: 09/03/2018 CLINICAL DATA:  Nausea and weakness. Fever. Bladder cancer metastatic to the skeleton, lungs, and mediastinum. Recent  initiation of radiation therapy 2 days ago. Chemotherapy starting 1 day ago. EXAM: CHEST - 2 VIEW COMPARISON:  Chest radiograph from 09/02/2018 FINDINGS: Power injectable right Port-A-Cath tip: SVC. Atherosclerotic calcification of the aortic arch. The patient is rotated to the right on today's radiograph, reducing diagnostic sensitivity and specificity. Bandlike density in the lingula likely from atelectasis or scarring, but this had a hypermetabolic component on the PET-CT suggesting at least a part of this is due to a metastatic nodule. The right lung appears clear although there is a known hypermetabolic pulmonary nodule medially at the right lung base in the retro diaphragmatic region which is not well seen by conventional radiography. Patient has a known sternal metastatic lesion, not well seen on today's chest radiograph. There  is some minimal blunting of the left posterior costophrenic angle and possibly the right posterior costophrenic angle. Mild enlargement of the cardiopericardial silhouette, without edema. The patient has known extensive thoracic spine metastatic disease which is poorly apparent on today's chest radiographs. IMPRESSION: 1. Stable mild cardiomegaly, without edema. 2. Mild lingular atelectasis with a known underlying hypermetabolic pulmonary nodule in this vicinity. 3. Other nodules at the right lung base and bony involvement in the sternum and thoracic spine are not readily apparent on conventional radiography. 4. Blunting of the posterior costophrenic angle suggesting small pleural effusions. 5.  Aortic Atherosclerosis (ICD10-I70.0). Electronically Signed   By: Van Clines M.D.   On: 09/03/2018 16:16   Ct Abdomen Pelvis W Contrast  Result Date: 09/03/2018 CLINICAL DATA:  History of bladder cancer. The patient is undergoing chemotherapy and radiation therapy. Nausea and vomiting. EXAM: CT ABDOMEN AND PELVIS WITH CONTRAST TECHNIQUE: Multidetector CT imaging of the abdomen and  pelvis was performed using the standard protocol following bolus administration of intravenous contrast. CONTRAST:  116mL ISOVUE-300 IOPAMIDOL (ISOVUE-300) INJECTION 61% COMPARISON:  Body CT 07/21/2018 FINDINGS: Lower chest: Right lower lobe pulmonary nodule measures 1.5 cm, stable. Subpleural nodularity in the left lower lobe, mainly 12 mm soft tissue nodule along the medial lower lobe pleura, and several soft tissue nodules along the inferolateral left lower lobe pleura. Small left pleural effusion. Small hiatal hernia. Hepatobiliary: Subtle 5 mm area of hypoattenuation in the inferior aspect of the right lobe of the liver, image 28/85, sequence 2. No gallstones, gallbladder wall thickening, or biliary dilatation. Pancreas: Unremarkable. No pancreatic ductal dilatation or surrounding inflammatory changes. Spleen: Normal in size without focal abnormality. Adrenals/Urinary Tract: Normal appearance of the adrenal glands. Benign-appearing 4.2 cm left renal cyst. 6 mm hypoattenuated nodule in the superior cortex of the right kidney, too small to be actually characterize. Stable appearance of the bladder. Stomach/Bowel: Stomach is within normal limits. No evidence of appendicitis. No evidence of bowel wall thickening, distention, or inflammatory changes. Vascular/Lymphatic: Aortic atherosclerosis. No enlarged abdominal or pelvic lymph nodes. Reproductive: Uterus and bilateral adnexa are unremarkable. Other: No abdominal wall hernia or abnormality. No abdominopelvic ascites. Musculoskeletal: Interval increase in the soft tissue component of the lytic lesions of the L2 spinous process and left lateral process with extension to the lamina and pedicle. New lytic lesion within the inferior aspect of T12 vertebral body. Increase lytic lesion within the superior endplate of L1 vertebral body with interval development of compression deformity of the superior endplate. Enlarging lytic lesions within the anterior aspect of the L3  and L4 vertebral bodies. IMPRESSION: Worsening pulmonary metastatic disease. Mainly, interval development of suspicious subpleural nodules in the left lower lobe and lingula. Small left pleural effusion. Stable right lower lobe pulmonary nodule. Worsening skeletal metastatic disease. Interval increase in the soft tissue component of the lytic lesions of the L2 spinous process and left lateral process with extension to the lamina and pedicle. New lytic lesion within the inferior aspect of T12 vertebral body. Increase lytic lesion within the superior endplate of L1 vertebral body with interval development of compression fracture of the superior endplate. Enlarging lytic lesions within the anterior aspect of the L3 and L4 vertebral bodies. Subtle 5 mm area of hypoattenuation in the inferior aspect of right lobe of the liver, too small to be actually characterize. Small hiatal hernia. Electronically Signed   By: Fidela Salisbury M.D.   On: 09/03/2018 16:58   Dg Chest Port 1 View  Result Date: 09/02/2018  CLINICAL DATA:  Hypotension. EXAM: PORTABLE CHEST 1 VIEW COMPARISON:  07/17/2018 and prior radiographs.  07/24/2018 CT. FINDINGS: The cardiomediastinal silhouette is unchanged. A RIGHT IJ Port-A-Cath is noted with tip overlying the mid SVC. RIGHT basilar opacity/atelectasis/scarring again noted. No new pulmonary opacities are identified. No definite pleural effusion or pneumothorax. IMPRESSION: No acute abnormality. Electronically Signed   By: Margarette Canada M.D.   On: 09/02/2018 23:48    Assessment/Plan  This is a 67 y.o. female with a history of hypertension, anemia bladder cancer with metastases to her lung and spine, now being admitted with:  #. Sepsis secondary to unclear source, immunocompromised - Admit to inpatient with telemetry monitoring - IV antibiotics: Cefepime, Vanco - IV fluid hydration - Follow up blood,urine & sputum cultures - Repeat CBC in am.  -Consider ID consult  #.  Hyponatremia,  euvolemic -Gentle IV fluid hydration -Repeat BMP in a.m. - Hold HCTZ  #.  History of bladder cancer with metastases to lung and bone -Oncologist will consult in a.m.  #. History of hypertension - Continue Norvasc, hydrochlorothiazide, metoprolol, olmesartan  #. History of hyperlipidemia - Continue Crestor  #. History of anemia - Continue iron   Admission status: Inpatient, telemetry IV Fluids: Normal saline Diet/Nutrition: Heart healthy Consults called: Oncology DVT Px: Lovenox, SCDs and early ambulation. Code Status: Full Code  Disposition Plan: To home in 1-2 days  All the records are reviewed and case discussed with ED provider. Management plans discussed with the patient and/or family who express understanding and agree with plan of care.  Anel Creighton D.O. on 09/03/2018 at 6:54 PM CC: Primary care physician; Patient, No Pcp Per   09/03/2018, 6:54 PM

## 2018-09-03 NOTE — ED Provider Notes (Signed)
[facility] DEPT Provider Note   CSN: [id number] Arrival date & time: 09/02/18  2232     History   Chief Complaint Chief Complaint  Patient presents with  . Nausea    HPI Faith [name] is a 67 y.o. female.  HPI 67 year old female with history of metastatic bladder cancer comes in with chief complaint of nausea and weakness.  Patient was just initiated on radiation therapy 2 days ago and chemotherapy 1 day ago.  Patient reports that she has had nausea for the past few days and her symptoms have progressed.  Patient is also having generalized weakness and poor diet.  Patient continues to have pain in the back and her abdomen.  Pt has no associated numbness, weakness, urinary incontinence, urinary retention, bowel incontinence, pins and needle sensation in the perineal area.    Past Medical History:  Diagnosis Date  . Anemia due to acute blood loss 11/24/2016   TRANSFUSED PRBC X2 UNITS 11-25-2016  . Bladder neoplasm   . Cancer ([place])   . Constipation   . Gross hematuria   . Hypertension   . Urgency of urination     Patient Active Problem List   Diagnosis Date Noted  . Goals of care, counseling/discussion 08/18/2018  . Urothelial carcinoma of bladder ([place]) 07/28/2018  . Acute blood loss anemia 11/25/2016  . HTN (hypertension) 11/25/2016  . Gross hematuria 11/25/2016    Past Surgical History:  Procedure Laterality Date  . CYSTOSCOPY W/ URETERAL STENT PLACEMENT Right 12/28/2016   Procedure: CYSTOSCOPY WITH RIGHT RETROGRADE URETERAL STENT PLACEMENT;  Surgeon: Festus Aloe, MD;  Location: [facility];  Service: Urology;  Laterality: Right;  . IR IMAGING GUIDED PORT INSERTION  08/30/2018  . TRANSURETHRAL RESECTION OF BLADDER TUMOR WITH MITOMYCIN-C N/A 12/28/2016   Procedure: TRANSURETHRAL RESECTION OF BLADDER TUMOR GREATER THAN 5 CM;  Surgeon: Festus Aloe, MD;  Location: [facility];  Service: Urology;   Laterality: N/A;  . TUBAL LIGATION Bilateral 1990's     OB History   None      Home Medications    Prior to Admission medications   Medication Sig Start Date End Date Taking? Authorizing Provider  acetaminophen (TYLENOL) 325 MG tablet Take 650 mg by mouth every 6 (six) hours as needed for mild pain, moderate pain or headache.   Yes [provider]  amLODipine (NORVASC) 10 MG tablet Take 1 tablet (10 mg total) by mouth every morning. 03/02/17  Yes Robyn Haber, MD  Docusate Calcium (STOOL SOFTENER PO) Take 2 tablets by mouth daily as needed (constipation).   Yes [provider]  ferrous sulfate 325 (65 FE) MG tablet Start taking only after constipation is relieved. Take 1 tablet once daily for 1 week, then 1 tablet twice daily for 1 week and then 1 tablet three times daily. Patient taking differently: Take 325 mg by mouth daily.  11/26/16  Yes Bonnielee Haff, MD  hydrochlorothiazide (HYDRODIURIL) 25 MG tablet Take 25 mg by mouth daily.  08/13/18  Yes [provider]  lidocaine-prilocaine (EMLA) cream Apply 1 application topically as needed. 08/18/18  Yes Wyatt Portela, MD  metoprolol succinate (TOPROL-XL) 100 MG 24 hr tablet Take 100 mg by mouth daily. 07/04/18  Yes [provider]  montelukast (SINGULAIR) 10 MG tablet Take 10 mg by mouth daily. 07/14/18  Yes [provider]  olmesartan (BENICAR) 40 MG tablet Take 40 mg by mouth daily. 05/19/18  Yes [provider]  omeprazole (  PRILOSEC) 40 MG capsule Take 40 mg by mouth daily. 07/03/18  Yes [provider]  polyethylene glycol (MIRALAX / GLYCOLAX) packet Take 17 g by mouth every other day. 03/02/17  Yes Robyn Haber, MD  prochlorperazine (COMPAZINE) 10 MG tablet Take 1 tablet (10 mg total) by mouth every 6 (six) hours as needed for nausea or vomiting. 08/18/18  Yes Wyatt Portela, MD  rosuvastatin (CRESTOR) 5 MG tablet Take 5 mg by mouth daily. 07/05/18  Yes [provider]  senna-docusate (SENOKOT-S) 8.6-50 MG tablet Take 1 tablet by mouth daily as needed. Patient taking differently: Take 1 tablet by mouth daily as needed for mild constipation or moderate constipation.  03/02/17  Yes Robyn Haber, MD  ondansetron (ZOFRAN ODT) 8 MG disintegrating tablet Take 1 tablet (8 mg total) by mouth every 8 (eight) hours as needed for nausea. 09/03/18   Varney Biles, MD  traMADol (ULTRAM) 50 MG tablet Take 1 tablet (50 mg total) by mouth every 6 (six) hours as needed. Patient not taking: Reported on 09/02/2018 12/28/16   Festus Aloe, MD    Family History Family History  Problem Relation Age of Onset  . Leukemia Father     Social History Social History   Tobacco Use  . Smoking status: Never Smoker  . Smokeless tobacco: Never Used  Substance Use Topics  . Alcohol use: No  . Drug use: No     Allergies   Tramadol   Review of Systems Review of Systems  All other systems reviewed and are negative.    Physical Exam Updated Vital Signs BP (!) 155/66   Pulse 73   Temp 98.8 F (37.1 C) (Oral)   Resp 17   Ht 5\' 2"  (1.575 m)   Wt 75.8 kg   SpO2 96%   BMI 30.54 kg/m   Physical Exam  Constitutional: She is oriented to person, place, and time. She appears well-developed.  HENT:  Head: Normocephalic and atraumatic.  Eyes: EOM are normal.  Neck: Normal range of motion. Neck supple.  Cardiovascular: Normal rate.  Pulmonary/Chest: Effort normal.  Abdominal: Bowel sounds are normal. There is tenderness.  Neurological: She is alert and oriented to person, place, and time.  Skin: Skin is warm and dry.  Nursing note and vitals reviewed.    ED Treatments / Results  Labs (all labs ordered are listed, but only abnormal results are displayed) Labs Reviewed  COMPREHENSIVE METABOLIC PANEL - Abnormal; Notable for the following components:      Result Value   Sodium 132 (*)    Chloride 92 (*)    Glucose, Bld 207 (*)    BUN 27 (*)    Creatinine,  Ser 1.17 (*)    Calcium 10.6 (*)    Total Protein 8.2 (*)    GFR calc non Af Amer 48 (*)    GFR calc Af Amer 55 (*)    All other components within normal limits  CBC WITH DIFFERENTIAL/PLATELET - Abnormal; Notable for the following components:   WBC 11.1 (*)    Neutro Abs 9.9 (*)    All other components within normal limits  URINALYSIS, ROUTINE W REFLEX MICROSCOPIC - Abnormal; Notable for the following components:   Color, Urine STRAW (*)    Hgb urine dipstick SMALL (*)    Bacteria, UA RARE (*)    All other components within normal limits  CBG MONITORING, ED - Abnormal; Notable for the following components:   Glucose-Capillary 164 (*)  All other components within normal limits  I-STAT CG4 LACTIC ACID, ED - Abnormal; Notable for the following components:   Lactic Acid, Venous 2.07 (*)    All other components within normal limits  MAGNESIUM  I-STAT CG4 LACTIC ACID, ED    EKG None  Radiology Dg Chest Port 1 View  Result Date: 09/02/2018 CLINICAL DATA:  Hypotension. EXAM: PORTABLE CHEST 1 VIEW COMPARISON:  07/17/2018 and prior radiographs.  07/24/2018 CT. FINDINGS: The cardiomediastinal silhouette is unchanged. A RIGHT IJ Port-A-Cath is noted with tip overlying the mid SVC. RIGHT basilar opacity/atelectasis/scarring again noted. No new pulmonary opacities are identified. No definite pleural effusion or pneumothorax. IMPRESSION: No acute abnormality. Electronically Signed   By: Margarette Canada M.D.   On: 09/02/2018 23:48    Procedures Procedures (including critical care time)  Medications Ordered in ED Medications  ondansetron (ZOFRAN) injection 4 mg (4 mg Intravenous Given 09/02/18 2341)  lactated ringers bolus 1,000 mL (0 mLs Intravenous Stopped 09/03/18 0109)  ondansetron (ZOFRAN-ODT) disintegrating tablet 8 mg (8 mg Oral Given 09/03/18 0109)  ondansetron (ZOFRAN) injection 4 mg (4 mg Intravenous Given 09/03/18 0426)     Initial Impression / Assessment and Plan / ED Course  I have  reviewed the triage vital signs and the nursing notes.  Pertinent labs & imaging results that were available during my care of the patient were reviewed by me and considered in my medical decision making (see chart for details).  Clinical Course as of Sep 03 921  Sun Sep 03, 2018  0417 Patient reassessed x2 She has passed oral challenge. Results from the ER have been discussed with the patient and family. No signs of infection.  No indication for abdominal scan.  Strict ER return precautions have been discussed. We have also discussed with him the fact that patient has metastases to her spine, therefore if she starts developing any sort of new neurologic symptoms in the lower extremities or starts having urinary retention or incontinence she will need to return to the ER immediately.   [AN]    Clinical Course User Index [AN] Varney Biles, MD    Patient comes in with chief complaint of nausea.  Patient has known history of bladder cancer with mets to her spine and lung. On exam patient has abdominal tenderness that is diffuse without any peritoneal findings.  Patient is having nausea, however she was just started on chemotherapy and radiation therapy few days ago.  Her symptoms could be because of her cancer or related to her chemotherapy. History is not suggestive of any underlying infection. Labs ordered to ensure there is no signs of UTI.  Also we want to make sure that she does not have any significant electrolyte abnormalities or signs of dehydration.  Anticipate discharge unless there is significant lab abnormalities.  Final Clinical Impressions(s) / ED Diagnoses   Final diagnoses:  Nausea without vomiting  Weakness    ED Discharge Orders         Ordered    ondansetron (ZOFRAN ODT) 8 MG disintegrating tablet  Every 8 hours PRN     09/03/18 0445           Varney Biles, MD 09/03/18 [id number]

## 2018-09-03 NOTE — Progress Notes (Signed)
Pharmacy Antibiotic Note  Faith [name] is a 67 y.o. female admitted on 09/03/2018 with sepsis.  Pharmacy has been consulted for vancomycin and cefepime dosing.  Plan: Cefepime 2 gr IV q24h  Vancomycin 1500 mg IV x1, then 1000 mg IV q24h  Monitor clinical course, renal function, cultures as available   Height: 5\' 2"  (157.5 cm) Weight: 171 lb 4.8 oz (77.7 kg) IBW/kg (Calculated) : 50.1  Temp (24hrs), Avg:99.4 F (37.4 C), Min:98.6 F (37 C), Max:101.1 F (38.4 C)  Recent Labs  Lab 08/30/18 1316 08/31/18 0853 09/02/18 2303 09/02/18 2330 09/03/18 0121 09/03/18 1431 09/03/18 1640  WBC 8.5 7.4 11.1*  --   --  13.8*  --   CREATININE  --  1.13* 1.17*  --   --  1.12*  --   LATICACIDVEN  --   --   --  2.07* 1.42  --  1.25    Estimated Creatinine Clearance: 48.3 mL/min (A) (by C-G formula based on SCr of 1.12 mg/dL (H)).    Allergies  Allergen Reactions  . Tramadol Nausea And Vomiting    Antimicrobials this admission: 9/8 vancomycin >>  9/8 cefepime >>   Dose adjustments this admission: ---  Microbiology results: 9/8 BCx:  9/8 UCx:   9/8 Sputum:   9/8 HIV antibody:   Thank you for allowing pharmacy to be a part of this patient's care.   Royetta Asal, PharmD, BCPS Pager [phone] 09/03/2018 8:59 PM

## 2018-09-03 NOTE — ED Notes (Signed)
Bed: EQ14 Expected date:  Expected time:  Means of arrival:  Comments: Ca pt / fever

## 2018-09-04 ENCOUNTER — Ambulatory Visit
Admission: RE | Admit: 2018-09-04 | Discharge: 2018-09-04 | Disposition: A | Discharge status: Home / Self Care | Payer: Medicare Other | Source: Ambulatory Visit | Attending: Radiation Oncology | Admitting: Radiation Oncology

## 2018-09-04 DIAGNOSIS — R5081 Fever presenting with conditions classified elsewhere: Secondary | ICD-10-CM

## 2018-09-04 DIAGNOSIS — C679 Malignant neoplasm of bladder, unspecified: Secondary | ICD-10-CM

## 2018-09-04 DIAGNOSIS — E871 Hypo-osmolality and hyponatremia: Secondary | ICD-10-CM

## 2018-09-04 DIAGNOSIS — C781 Secondary malignant neoplasm of mediastinum: Secondary | ICD-10-CM

## 2018-09-04 DIAGNOSIS — E785 Hyperlipidemia, unspecified: Secondary | ICD-10-CM

## 2018-09-04 DIAGNOSIS — C7800 Secondary malignant neoplasm of unspecified lung: Secondary | ICD-10-CM

## 2018-09-04 DIAGNOSIS — C7951 Secondary malignant neoplasm of bone: Secondary | ICD-10-CM

## 2018-09-04 DIAGNOSIS — I10 Essential (primary) hypertension: Secondary | ICD-10-CM

## 2018-09-04 LAB — COMPREHENSIVE METABOLIC PANEL
ALBUMIN: 2.9 g/dL — AB (ref 3.5–5.0)
ALK PHOS: 68 U/L (ref 38–126)
ALT: 11 U/L (ref 0–44)
ANION GAP: 10 (ref 5–15)
AST: 16 U/L (ref 15–41)
BUN: 19 mg/dL (ref 8–23)
CALCIUM: 9.7 mg/dL (ref 8.9–10.3)
CO2: 29 mmol/L (ref 22–32)
Chloride: 93 mmol/L — ABNORMAL LOW (ref 98–111)
Creatinine, Ser: 1 mg/dL (ref 0.44–1.00)
GFR calc non Af Amer: 58 mL/min — ABNORMAL LOW (ref >60)
GLUCOSE: 115 mg/dL — AB (ref 70–99)
POTASSIUM: 4.3 mmol/L (ref 3.5–5.1)
Sodium: 132 mmol/L — ABNORMAL LOW (ref 135–145)
Total Bilirubin: 0.8 mg/dL (ref 0.3–1.2)
Total Protein: 7.2 g/dL (ref 6.5–8.1)

## 2018-09-04 LAB — CBC
HEMATOCRIT: 34.9 % — AB (ref 36.0–46.0)
Hemoglobin: 11.5 g/dL — ABNORMAL LOW (ref 12.0–15.0)
MCH: 26.4 pg (ref 26.0–34.0)
MCHC: 33 g/dL (ref 30.0–36.0)
MCV: 80.2 fL (ref 78.0–100.0)
PLATELETS: 233 10*3/uL (ref 150–400)
RBC: 4.35 MIL/uL (ref 3.87–5.11)
RDW: 12.9 % (ref 11.5–15.5)
WBC: 11.3 10*3/uL — AB (ref 4.0–10.5)

## 2018-09-04 LAB — HIV ANTIBODY (ROUTINE TESTING W REFLEX): HIV Screen 4th Generation wRfx: NONREACTIVE

## 2018-09-04 LAB — GLUCOSE, CAPILLARY: GLUCOSE-CAPILLARY: 113 mg/dL — AB (ref 70–99)

## 2018-09-04 MED ORDER — SODIUM CHLORIDE 0.9 % IV SOLN
1.0000 g | Freq: Three times a day (TID) | INTRAVENOUS | Status: DC
  Administered 2018-09-04 – 2018-09-05 (×3): 1 g via INTRAVENOUS
  Filled 2018-09-04 (×4): qty 1

## 2018-09-04 NOTE — Progress Notes (Signed)
  Radiation Oncology         [phone] ________________________________  Name: [name] MRN: [id number]  Date: 08/24/2018  DOB: Nov 24, 1951  SIMULATION AND TREATMENT PLANNING NOTE  DIAGNOSIS:     ICD-10-CM   1. Bone metastasis ([place]) C79.51      Site:   1.  T-spine 2.   L-spine  NARRATIVE:  The patient was brought to the [place].  Identity was confirmed.  All relevant records and images related to the planned course of therapy were reviewed.   Written consent to proceed with treatment was confirmed which was freely given after reviewing the details related to the planned course of therapy had been reviewed with the patient.  Then, the patient was set-up in a stable reproducible  supine position for radiation therapy.  CT images were obtained.  Surface markings were placed.    Medically necessary complex treatment device(s) for immobilization:  Vac-lock bag.   The CT images were loaded into the planning software.  Then the target and avoidance structures were contoured.  Treatment planning then occurred.  The radiation prescription was entered and confirmed.  A total of 4 complex treatment devices were fabricated which relate to the designed radiation treatment fields. Each of these customized fields/ complex treatment devices will be used on a daily basis during the radiation course. I have requested : Isodose Plan.   PLAN:  The patient will receive 37.5 Gy in 15 fractions.   Special treatment procedure The patient will also receive concurrent chemotherapy during the treatment. The patient may therefore experience increased toxicity or side effects and the patient will be monitored for such problems. This may require extra lab work as necessary. This therefore constitutes a special treatment procedure.   ________________________________   Jodelle Gross, MD, PhD

## 2018-09-04 NOTE — Progress Notes (Signed)
Patient to radiation with radiation oncology transporter.

## 2018-09-04 NOTE — Progress Notes (Signed)
IP PROGRESS NOTE  Subjective:   66 year old woman with metastatic bladder cancer with disease to the bone, mediastinum and lung diagnosed in August 2019.  He is currently receiving systemic chemotherapy utilizing gemcitabine and cisplatin and received the first cycle of chemotherapy on 08/31/2018.  She presented on 09/03/2018 with complaints of fever and impending sepsis.  She was started on broad-spectrum antibiotics cultures are currently pending.  Clinically, she feels slightly better but still nauseated with a temperature of 101.1 noted overnight.  She denies any chills or sweats.  She denies any vomiting or difficulty breathing.  She is also receiving ongoing radiation therapy for a thoracic spine metastasis.  Objective:  Vital signs in last 24 hours: Temp:  [98.3 F (36.8 C)-101.1 F (38.4 C)] 98.3 F (36.8 C) (09/09 0507) Pulse Rate:  [71-96] 71 (09/09 0507) Resp:  [13-18] 18 (09/09 0507) BP: (139-172)/(67-81) 139/67 (09/09 0507) SpO2:  [92 %-99 %] 99 % (09/09 0507) Weight:  [171 lb 4.8 oz (77.7 kg)] 171 lb 4.8 oz (77.7 kg) (09/08 2005) Weight change:     Intake/Output from previous day: 09/08 0701 - 09/09 0700 In: 876.2 [P.O.:240; I.V.:636.2] Out: 700 [Urine:700] General: Alert, awake without distress. Head: Normocephalic atraumatic. Mouth: mucous membranes moist, pharynx normal without lesions Eyes: No scleral icterus.  Pupils are equal and round reactive to light. Resp: clear to auscultation bilaterally without rhonchi or wheezes or dullness to percussion. Cardio: regular rate and rhythm, S1, S2 normal, no murmur, click, rub or gallop GI: soft, non-tender; bowel sounds normal; no masses,  no organomegaly Musculoskeletal: No joint deformity or effusion. Neurological: No motor, sensory deficits.  Intact deep tendon reflexes. Skin: No rashes or lesions.  Portacath not accessed at this time.  Lab Results: Recent Labs    09/03/18 1431 09/04/18 0432  WBC 13.8* 11.3*  HGB  12.9 11.5*  HCT 37.8 34.9*  PLT 284 233    BMET Recent Labs    09/03/18 1431 09/04/18 0432  NA 126* 132*  K 3.6 4.3  CL 88* 93*  CO2 28 29  GLUCOSE 169* 115*  BUN 23 19  CREATININE 1.12* 1.00  CALCIUM 9.9 9.7    Studies/Results: Dg Chest 2 View  Result Date: 09/03/2018 CLINICAL DATA:  Nausea and weakness. Fever. Bladder cancer metastatic to the skeleton, lungs, and mediastinum. Recent initiation of radiation therapy 2 days ago. Chemotherapy starting 1 day ago. EXAM: CHEST - 2 VIEW COMPARISON:  Chest radiograph from 09/02/2018 FINDINGS: Power injectable right Port-A-Cath tip: SVC. Atherosclerotic calcification of the aortic arch. The patient is rotated to the right on today's radiograph, reducing diagnostic sensitivity and specificity. Bandlike density in the lingula likely from atelectasis or scarring, but this had a hypermetabolic component on the PET-CT suggesting at least a part of this is due to a metastatic nodule. The right lung appears clear although there is a known hypermetabolic pulmonary nodule medially at the right lung base in the retro diaphragmatic region which is not well seen by conventional radiography. Patient has a known sternal metastatic lesion, not well seen on today's chest radiograph. There is some minimal blunting of the left posterior costophrenic angle and possibly the right posterior costophrenic angle. Mild enlargement of the cardiopericardial silhouette, without edema. The patient has known extensive thoracic spine metastatic disease which is poorly apparent on today's chest radiographs. IMPRESSION: 1. Stable mild cardiomegaly, without edema. 2. Mild lingular atelectasis with a known underlying hypermetabolic pulmonary nodule in this vicinity. 3. Other nodules at the right lung base  and bony involvement in the sternum and thoracic spine are not readily apparent on conventional radiography. 4. Blunting of the posterior costophrenic angle suggesting small pleural  effusions. 5.  Aortic Atherosclerosis (ICD10-I70.0). Electronically Signed   By: Van Clines M.D.   On: 09/03/2018 16:16   Ct Abdomen Pelvis W Contrast  Result Date: 09/03/2018 CLINICAL DATA:  History of bladder cancer. The patient is undergoing chemotherapy and radiation therapy. Nausea and vomiting. EXAM: CT ABDOMEN AND PELVIS WITH CONTRAST TECHNIQUE: Multidetector CT imaging of the abdomen and pelvis was performed using the standard protocol following bolus administration of intravenous contrast. CONTRAST:  13mL ISOVUE-300 IOPAMIDOL (ISOVUE-300) INJECTION 61% COMPARISON:  Body CT 07/21/2018 FINDINGS: Lower chest: Right lower lobe pulmonary nodule measures 1.5 cm, stable. Subpleural nodularity in the left lower lobe, mainly 12 mm soft tissue nodule along the medial lower lobe pleura, and several soft tissue nodules along the inferolateral left lower lobe pleura. Small left pleural effusion. Small hiatal hernia. Hepatobiliary: Subtle 5 mm area of hypoattenuation in the inferior aspect of the right lobe of the liver, image 28/85, sequence 2. No gallstones, gallbladder wall thickening, or biliary dilatation. Pancreas: Unremarkable. No pancreatic ductal dilatation or surrounding inflammatory changes. Spleen: Normal in size without focal abnormality. Adrenals/Urinary Tract: Normal appearance of the adrenal glands. Benign-appearing 4.2 cm left renal cyst. 6 mm hypoattenuated nodule in the superior cortex of the right kidney, too small to be actually characterize. Stable appearance of the bladder. Stomach/Bowel: Stomach is within normal limits. No evidence of appendicitis. No evidence of bowel wall thickening, distention, or inflammatory changes. Vascular/Lymphatic: Aortic atherosclerosis. No enlarged abdominal or pelvic lymph nodes. Reproductive: Uterus and bilateral adnexa are unremarkable. Other: No abdominal wall hernia or abnormality. No abdominopelvic ascites. Musculoskeletal: Interval increase in the  soft tissue component of the lytic lesions of the L2 spinous process and left lateral process with extension to the lamina and pedicle. New lytic lesion within the inferior aspect of T12 vertebral body. Increase lytic lesion within the superior endplate of L1 vertebral body with interval development of compression deformity of the superior endplate. Enlarging lytic lesions within the anterior aspect of the L3 and L4 vertebral bodies. IMPRESSION: Worsening pulmonary metastatic disease. Mainly, interval development of suspicious subpleural nodules in the left lower lobe and lingula. Small left pleural effusion. Stable right lower lobe pulmonary nodule. Worsening skeletal metastatic disease. Interval increase in the soft tissue component of the lytic lesions of the L2 spinous process and left lateral process with extension to the lamina and pedicle. New lytic lesion within the inferior aspect of T12 vertebral body. Increase lytic lesion within the superior endplate of L1 vertebral body with interval development of compression fracture of the superior endplate. Enlarging lytic lesions within the anterior aspect of the L3 and L4 vertebral bodies. Subtle 5 mm area of hypoattenuation in the inferior aspect of right lobe of the liver, too small to be actually characterize. Small hiatal hernia. Electronically Signed   By: Fidela Salisbury M.D.   On: 09/03/2018 16:58      Medications: I have reviewed the patient's current medications.  Assessment/Plan:  67 year old woman with the following:  1.  Advanced high-grade urothelial carcinoma arising from the bladder.  She is currently receiving palliative chemotherapy utilizing gemcitabine and cisplatin started on 08/31/2018.  She is scheduled for day 8 of cycle 1 of chemotherapy on 09/07/2018 and will likely be held given her current illness.  Chemotherapy will be resumed once her issues have resolved and has fully  recovered.  CT scan as well as chest x-ray were  personally reviewed did not show any clear source of infection at this time.  Her disease is slowly progressed which is not surprising as she has just started systemic chemotherapy.  2.  Fever: No neutropenia noted at this time with adequate ANC.  Agree with the broad-spectrum antibiotic as well as supportive management as you are doing.  We will await blood cultures and urine culture for identification of source.  3.  Spinal metastasis: She is undergoing radiation therapy: I have no objections of continuing this while she is hospitalized.  4.  Disposition: She does not appear to be ready for discharge.  I have no objections to discharge home after her fever has resolved and remains clinically stable.  15  minutes was spent with the patient face-to-face today.  More than 50% of time was dedicated to reviewing laboratory data, imaging studies as well as discussing with the patient future plan of care.    LOS: 1 day   Zola Button 09/04/2018, 7:40 AM

## 2018-09-04 NOTE — Progress Notes (Signed)
Pt's PCP is Dr. Marcia Brash [phone].

## 2018-09-04 NOTE — Progress Notes (Signed)
PROGRESS NOTE    Faith [name]  QPY:[id number] DOB: March 19, 1951 DOA: 09/03/2018 PCP: Patient, No Pcp Per   Brief Narrative: Faith [name] is a 67 y.o. female with a known history of hypertension, anemia bladder cancer with metastases to her lung and spine, recently diagnosed and started radiation and chemotherapy. She presented secondary to fever and nausea with concern for infection. Unknown source.   Assessment & Plan:   Principal Problem:   SIRS (systemic inflammatory response syndrome) (HCC) Active Problems:   HTN (hypertension)   Hyponatremia   Hyperlipidemia   SIRS No source, so not sepsis at this time. Fever and elevated WBC. Started on broad spectrum antibiotics. Patient is immunocompromised secondary to chemotherapy -Continue Cefepime and Vancomycin -Urine/blood culture pending  Hyponatremia Thought secondary to hydrochlorothiazine. Significantly improved. -Discontinue hydrochlorothiazide on discharge  Bladder cancer w/ metastasis to lung/bone Follows with Dr. Alen Blew as an outpatient. Currently receiving chemotherapy and radiation therapy.  Hypertension -Continue Norvasc, irbesartan, metoprolol  Hyperlipidemia -Continue Crestor  Anemia -Continue iron supplementation   DVT prophylaxis: Lovenox Code Status:   Code Status: Full Code Family Communication: Brother and sister in law at bedside Disposition Plan: Discharge pending sepsis workup   Consultants:   Hematology/Oncology  Procedures:   None  Antimicrobials:  Vancomycin  Cefepime    Subjective: No issues today. Feels better.  Objective: Vitals:   09/03/18 2006 09/03/18 2108 09/04/18 0507 09/04/18 1333  BP: (!) 145/71  139/67 135/71  Pulse:   71 71  Resp: 18  18 18   Temp: 98.6 F (37 C)  98.3 F (36.8 C) 98.6 F (37 C)  TempSrc: Oral  Oral Oral  SpO2:  92% 99% 100%  Weight:      Height:        Intake/Output Summary (Last 24 hours) at 09/04/2018 1417 Last data filed at  09/04/2018 0600 Gross per 24 hour  Intake 876.21 ml  Output 700 ml  Net 176.21 ml   Filed Weights   09/03/18 2005  Weight: 77.7 kg    Examination:  General exam: Appears calm and comfortable HEENT: poor dentition with multiple broken teeth and no evidence of inflammation Respiratory system: Clear to auscultation. Respiratory effort normal. Cardiovascular system: S1 & S2 heard, RRR. No murmurs, rubs, gallops or clicks. Gastrointestinal system: Abdomen is nondistended, soft and nontender. No organomegaly or masses felt. Normal bowel sounds heard. Central nervous system: Alert and oriented. No focal neurological deficits. Extremities: No edema. No calf tenderness Skin: No cyanosis. No rashes Psychiatry: Judgement and insight appear normal. Mood & affect appropriate.     Data Reviewed: I have personally reviewed following labs and imaging studies  CBC: Recent Labs  Lab 08/30/18 1316 08/31/18 0853 09/02/18 2303 09/03/18 1431 09/04/18 0432  WBC 8.5 7.4 11.1* 13.8* 11.3*  NEUTROABS  --  4.6 9.9* 13.3*  --   HGB 13.5 13.7 13.3 12.9 11.5*  HCT 40.5 41.6 39.5 37.8 34.9*  MCV 80.7 81.4 81.4 78.9 80.2  PLT 360 267 311 284 124   Basic Metabolic Panel: Recent Labs  Lab 08/31/18 0853 09/02/18 2303 09/02/18 2322 09/03/18 1431 09/03/18 2030 09/04/18 0432  NA 137 132*  --  126*  --  132*  K 4.2 4.0  --  3.6  --  4.3  CL 100 92*  --  88*  --  93*  CO2 25 27  --  28  --  29  GLUCOSE 107* 207*  --  169*  --  115*  BUN 21  27*  --  23  --  19  CREATININE 1.13* 1.17*  --  1.12*  --  1.00  CALCIUM 10.7* 10.6*  --  9.9  --  9.7  MG  --   --  2.0  --  2.0  --   PHOS  --   --   --   --  3.1  --    GFR: Estimated Creatinine Clearance: 54.1 mL/min (by C-G formula based on SCr of 1 mg/dL). Liver Function Tests: Recent Labs  Lab 08/31/18 0853 09/02/18 2303 09/03/18 1431 09/04/18 0432  AST 17 22 22 16   ALT 8 16 14 11   ALKPHOS 95 90 82 68  BILITOT 0.5 0.9 1.0 0.8  PROT 8.4*  8.2* 7.7 7.2  ALBUMIN 3.7 3.7 3.3* 2.9*   Recent Labs  Lab 09/03/18 1431  LIPASE 39   No results for input(s): AMMONIA in the last 168 hours. Coagulation Profile: Recent Labs  Lab 08/30/18 1316  INR 0.98   Cardiac Enzymes: No results for input(s): CKTOTAL, CKMB, CKMBINDEX, TROPONINI in the last 168 hours. BNP (last 3 results) No results for input(s): PROBNP in the last 8760 hours. HbA1C: No results for input(s): HGBA1C in the last 72 hours. CBG: Recent Labs  Lab 09/02/18 2311  GLUCAP 164*   Lipid Profile: No results for input(s): CHOL, HDL, LDLCALC, TRIG, CHOLHDL, LDLDIRECT in the last 72 hours. Thyroid Function Tests: No results for input(s): TSH, T4TOTAL, FREET4, T3FREE, THYROIDAB in the last 72 hours. Anemia Panel: No results for input(s): VITAMINB12, FOLATE, FERRITIN, TIBC, IRON, RETICCTPCT in the last 72 hours. Sepsis Labs: Recent Labs  Lab 09/02/18 2330 09/03/18 0121 09/03/18 1640  LATICACIDVEN 2.07* 1.42 1.25    Recent Results (from the past 240 hour(s))  Blood culture (routine x 2)     Status: None (Preliminary result)   Collection Time: 09/03/18  4:40 PM  Result Value Ref Range Status   Specimen Description   Final    BLOOD RIGHT ARM Performed at [facility] [address] [place], [place] [address]    Special Requests   Final    BOTTLES DRAWN AEROBIC AND ANAEROBIC Blood Culture adequate volume Performed at [facility] [address] [place], [place] [address]    Culture   Final    NO GROWTH < 12 HOURS Performed at [facility] [address] [place], [place] [address]    Report Status PENDING  Incomplete  Blood culture (routine x 2)     Status: None (Preliminary result)   Collection Time: 09/03/18  4:45 PM  Result Value Ref Range Status   Specimen Description   Final    BLOOD LEFT ARM Performed at [facility] [address] [place], [place] [address]    Special  Requests   Final    BOTTLES DRAWN AEROBIC AND ANAEROBIC Blood Culture adequate volume Performed at [facility] [address], [place], [place] [address]    Culture   Final    NO GROWTH < 12 HOURS Performed at [facility] [address] [place], [place] [address]    Report Status PENDING  Incomplete         Radiology Studies: Dg Chest 2 View  Result Date: 09/03/2018 CLINICAL DATA:  Nausea and weakness. Fever. Bladder cancer metastatic to the skeleton, lungs, and mediastinum. Recent initiation of radiation therapy 2 days ago. Chemotherapy starting 1 day ago. EXAM: CHEST - 2  VIEW COMPARISON:  Chest radiograph from 09/02/2018 FINDINGS: Power injectable right Port-A-Cath tip: SVC. Atherosclerotic calcification of the aortic arch. The patient is rotated to the right on today's radiograph, reducing diagnostic sensitivity and specificity. Bandlike density in the lingula likely from atelectasis or scarring, but this had a hypermetabolic component on the PET-CT suggesting at least a part of this is due to a metastatic nodule. The right lung appears clear although there is a known hypermetabolic pulmonary nodule medially at the right lung base in the retro diaphragmatic region which is not well seen by conventional radiography. Patient has a known sternal metastatic lesion, not well seen on today's chest radiograph. There is some minimal blunting of the left posterior costophrenic angle and possibly the right posterior costophrenic angle. Mild enlargement of the cardiopericardial silhouette, without edema. The patient has known extensive thoracic spine metastatic disease which is poorly apparent on today's chest radiographs. IMPRESSION: 1. Stable mild cardiomegaly, without edema. 2. Mild lingular atelectasis with a known underlying hypermetabolic pulmonary nodule in this vicinity. 3. Other nodules at the right lung base and bony involvement in the sternum and thoracic spine are  not readily apparent on conventional radiography. 4. Blunting of the posterior costophrenic angle suggesting small pleural effusions. 5.  Aortic Atherosclerosis (ICD10-I70.0). Electronically Signed   By: Van Clines M.D.   On: 09/03/2018 16:16   Ct Abdomen Pelvis W Contrast  Result Date: 09/03/2018 CLINICAL DATA:  History of bladder cancer. The patient is undergoing chemotherapy and radiation therapy. Nausea and vomiting. EXAM: CT ABDOMEN AND PELVIS WITH CONTRAST TECHNIQUE: Multidetector CT imaging of the abdomen and pelvis was performed using the standard protocol following bolus administration of intravenous contrast. CONTRAST:  132mL ISOVUE-300 IOPAMIDOL (ISOVUE-300) INJECTION 61% COMPARISON:  Body CT 07/21/2018 FINDINGS: Lower chest: Right lower lobe pulmonary nodule measures 1.5 cm, stable. Subpleural nodularity in the left lower lobe, mainly 12 mm soft tissue nodule along the medial lower lobe pleura, and several soft tissue nodules along the inferolateral left lower lobe pleura. Small left pleural effusion. Small hiatal hernia. Hepatobiliary: Subtle 5 mm area of hypoattenuation in the inferior aspect of the right lobe of the liver, image 28/85, sequence 2. No gallstones, gallbladder wall thickening, or biliary dilatation. Pancreas: Unremarkable. No pancreatic ductal dilatation or surrounding inflammatory changes. Spleen: Normal in size without focal abnormality. Adrenals/Urinary Tract: Normal appearance of the adrenal glands. Benign-appearing 4.2 cm left renal cyst. 6 mm hypoattenuated nodule in the superior cortex of the right kidney, too small to be actually characterize. Stable appearance of the bladder. Stomach/Bowel: Stomach is within normal limits. No evidence of appendicitis. No evidence of bowel wall thickening, distention, or inflammatory changes. Vascular/Lymphatic: Aortic atherosclerosis. No enlarged abdominal or pelvic lymph nodes. Reproductive: Uterus and bilateral adnexa are  unremarkable. Other: No abdominal wall hernia or abnormality. No abdominopelvic ascites. Musculoskeletal: Interval increase in the soft tissue component of the lytic lesions of the L2 spinous process and left lateral process with extension to the lamina and pedicle. New lytic lesion within the inferior aspect of T12 vertebral body. Increase lytic lesion within the superior endplate of L1 vertebral body with interval development of compression deformity of the superior endplate. Enlarging lytic lesions within the anterior aspect of the L3 and L4 vertebral bodies. IMPRESSION: Worsening pulmonary metastatic disease. Mainly, interval development of suspicious subpleural nodules in the left lower lobe and lingula. Small left pleural effusion. Stable right lower lobe pulmonary nodule. Worsening skeletal metastatic disease. Interval increase in the soft tissue component of the  lytic lesions of the L2 spinous process and left lateral process with extension to the lamina and pedicle. New lytic lesion within the inferior aspect of T12 vertebral body. Increase lytic lesion within the superior endplate of L1 vertebral body with interval development of compression fracture of the superior endplate. Enlarging lytic lesions within the anterior aspect of the L3 and L4 vertebral bodies. Subtle 5 mm area of hypoattenuation in the inferior aspect of right lobe of the liver, too small to be actually characterize. Small hiatal hernia. Electronically Signed   By: Fidela Salisbury M.D.   On: 09/03/2018 16:58   Dg Chest Port 1 View  Result Date: 09/02/2018 CLINICAL DATA:  Hypotension. EXAM: PORTABLE CHEST 1 VIEW COMPARISON:  07/17/2018 and prior radiographs.  07/24/2018 CT. FINDINGS: The cardiomediastinal silhouette is unchanged. A RIGHT IJ Port-A-Cath is noted with tip overlying the mid SVC. RIGHT basilar opacity/atelectasis/scarring again noted. No new pulmonary opacities are identified. No definite pleural effusion or  pneumothorax. IMPRESSION: No acute abnormality. Electronically Signed   By: Margarette Canada M.D.   On: 09/02/2018 23:48        Scheduled Meds: . amLODipine  10 mg Oral q morning - 10a  . enoxaparin (LOVENOX) injection  40 mg Subcutaneous Q24H  . ferrous sulfate  325 mg Oral Daily  . irbesartan  300 mg Oral Daily  . metoprolol succinate  100 mg Oral Daily  . montelukast  10 mg Oral Daily  . pantoprazole  40 mg Oral Daily  . polyethylene glycol  17 g Oral QODAY  . rosuvastatin  5 mg Oral Daily   Continuous Infusions: . sodium chloride 75 mL/hr at 09/04/18 1231  . ceFEPime (MAXIPIME) IV 1 g (09/04/18 1323)  . vancomycin       LOS: 1 day     Cordelia Poche, MD [facility] 09/04/2018, 2:17 PM Pager: [phone]  If 7PM-7AM, please contact night-coverage [email or web address] 09/04/2018, 2:17 PM

## 2018-09-04 NOTE — Evaluation (Signed)
Physical Therapy Evaluation Patient Details Name: Faith [name] MRN: [id number] DOB: 1951-01-03 Today's Date: 09/04/2018   History of Present Illness  67 year old woman with metastatic bladder cancer with disease to the bone, mediastinum and lung diagnosed in August 2019.  She is currently receiving systemic chemotherapy utilizing gemcitabine and cisplatin and received the first cycle of chemotherapy on 08/31/2018.  She presented on 09/03/2018 with complaints of fever and impending sepsis  Clinical Impression  Pt admitted with above diagnosis. Pt currently with functional limitations due to the deficits listed below (see PT Problem List).  Pt will benefit from skilled PT to increase their independence and safety with mobility to allow discharge to the venue listed below.  Pt assisted with ambulating short distance in hallway.  Pain limiting pt's ability to perform bed mobility and transfers.  Pt lives at home with her son.  Recommend initial assist for mobility upon d/c and HHPT.  If family unable to assist, pt may need SNF.     Follow Up Recommendations Home health PT;Supervision for mobility/OOB    Equipment Recommendations  Rolling walker with 5" wheels    Recommendations for Other Services       Precautions / Restrictions Precautions Precautions: Fall      Mobility  Bed Mobility Overal bed mobility: Needs Assistance Bed Mobility: Rolling;Sidelying to Sit;Sit to Supine Rolling: Min guard Sidelying to sit: Mod assist   Sit to supine: Mod assist   General bed mobility comments: encouraged log roll technique, assist required for trunk upright due to pain, required assist for LEs onto bed  Transfers Overall transfer level: Needs assistance Equipment used: Rolling walker (2 wheeled) Transfers: Sit to/from Stand Sit to Stand: Min assist         General transfer comment: verbal cues for technique, assist to rise  Ambulation/Gait Ambulation/Gait assistance: Min guard Gait  Distance (Feet): 40 Feet Assistive device: Rolling walker (2 wheeled) Gait Pattern/deviations: Step-through pattern;Decreased stride length Gait velocity: decr   General Gait Details: verbal cues for RW positioning, distance to tolerance  Stairs            Wheelchair Mobility    Modified Rankin (Stroke Patients Only)       Balance Overall balance assessment: Needs assistance         Standing balance support: Bilateral upper extremity supported Standing balance-Leahy Scale: Poor Standing balance comment: requiring UE support                             Pertinent Vitals/Pain Pain Assessment: Faces Faces Pain Scale: Hurts even more Pain Location: pt reports line around "where bra hooks" of pain Pain Descriptors / Indicators: Grimacing;Guarding(worse with transitional movements) Pain Intervention(s): Repositioned;Monitored during session;Limited activity within patient's tolerance;Patient requesting pain meds-RN notified    Home Living Family/patient expects to be discharged to:: Private residence Living Arrangements: Children(son) Available Help at Discharge: Family(son works during day) Type of Home: [place]: One [place]: None      Prior Function Level of Independence: Independent               Hand Dominance        Extremity/Trunk Assessment        Lower Extremity Assessment Lower Extremity Assessment: Generalized weakness       Communication   Communication: No difficulties  Cognition Arousal/Alertness: Awake/alert Behavior During Therapy: Flat affect Overall Cognitive Status: Within Functional Limits  for tasks assessed                                        General Comments      Exercises     Assessment/Plan    PT Assessment Patient needs continued PT services  PT Problem List Decreased mobility;Decreased strength;Decreased balance;Decreased activity tolerance;Decreased  knowledge of use of DME;Pain       PT Treatment Interventions DME instruction;Therapeutic activities;Gait training;Therapeutic exercise;Patient/family education;Functional mobility training    PT Goals (Current goals can be found in the Care Plan section)  Acute Rehab PT Goals PT Goal Formulation: With patient Time For Goal Achievement: 09/11/18 Potential to Achieve Goals: Good    Frequency Min 3X/week   Barriers to discharge        Co-evaluation               AM-PAC PT "6 Clicks" Daily Activity  Outcome Measure Difficulty turning over in bed (including adjusting bedclothes, sheets and blankets)?: A Lot Difficulty moving from lying on back to sitting on the side of the bed? : Unable Difficulty sitting down on and standing up from a chair with arms (e.g., wheelchair, bedside commode, etc,.)?: Unable Help needed moving to and from a bed to chair (including a wheelchair)?: A Little Help needed walking in hospital room?: A Little Help needed climbing 3-5 steps with a railing? : A Lot 6 Click Score: 12    End of Session Equipment Utilized During Treatment: (pt declined belt use due to pain) Activity Tolerance: Patient tolerated treatment well Patient left: in bed;with bed alarm set;with call bell/phone within reach;with family/visitor present Nurse Communication: Mobility status PT Visit Diagnosis: Other abnormalities of gait and mobility (R26.89)    Time: 9038-3338 PT Time Calculation (min) (ACUTE ONLY): 23 min   Charges:   PT Evaluation $PT Eval Low Complexity: 1 Low         Carmelia Bake, PT, DPT 09/04/2018 Pager: [phone]  [place] Ram E 09/04/2018, 1:05 PM

## 2018-09-04 NOTE — Progress Notes (Signed)
Pharmacy Antibiotic Note  Faith [name] is a 67 y.o. female with hx metastatic bladder cancer to lung and spine currently undergoing chemotherapy and XRT, presented to the ED on 09/03/2018 with c/o fever and nausea.  Broad abx with vancomycin and cefepime started on admission for suspected sepsis.  Today, 09/04/2018: - day #1 abx - Tmax 101.1,  wbc down 11.3, ANC 13.3 - scr down 1 (crcl~54) - all cultures have been negative thus far   Plan: - adjust cefepime dose to 1gm IV q8h for renal function - continue vancomycin 1000 mg IV q24h - monitor renal function - f/u cultures  ______________________________  Height: 5\' 2"  (157.5 cm) Weight: 171 lb 4.8 oz (77.7 kg) IBW/kg (Calculated) : 50.1  Temp (24hrs), Avg:99.2 F (37.3 C), Min:98.3 F (36.8 C), Max:101.1 F (38.4 C)  Recent Labs  Lab 08/30/18 1316 08/31/18 0853 09/02/18 2303 09/02/18 2330 09/03/18 0121 09/03/18 1431 09/03/18 1640 09/04/18 0432  WBC 8.5 7.4 11.1*  --   --  13.8*  --  11.3*  CREATININE  --  1.13* 1.17*  --   --  1.12*  --  1.00  LATICACIDVEN  --   --   --  2.07* 1.42  --  1.25  --     Estimated Creatinine Clearance: 54.1 mL/min (by C-G formula based on SCr of 1 mg/dL).    Allergies  Allergen Reactions  . Tramadol Nausea And Vomiting    Antimicrobials this admission:  9/8 vancomycin >>  9/8 cefepime >>   Dose adjustments this admission:  --   Microbiology results:  9/8 BCx x2:  9/8 UCx:   9/8 HIV antibody:   Thank you for allowing pharmacy to be a part of this patient's care.  Lynelle Doctor 09/04/2018 12:36 PM

## 2018-09-05 ENCOUNTER — Ambulatory Visit
Admission: RE | Admit: 2018-09-05 | Discharge: 2018-09-05 | Disposition: A | Discharge status: Home / Self Care | Payer: Medicare Other | Source: Ambulatory Visit | Attending: Radiation Oncology | Admitting: Radiation Oncology

## 2018-09-05 LAB — URINE CULTURE: CULTURE: NO GROWTH

## 2018-09-05 MED ORDER — POLYETHYLENE GLYCOL 3350 17 G PO PACK
17.0000 g | PACK | Freq: Every day | ORAL | Status: DC
  Administered 2018-09-05 – 2018-09-07 (×2): 17 g via ORAL
  Filled 2018-09-05 (×3): qty 1

## 2018-09-05 NOTE — Progress Notes (Signed)
PROGRESS NOTE    Faith [name]  TWS:[id number] DOB: [birth year] DOA: 09/03/2018 PCP: Patient, No Pcp Per   Brief Narrative: Faith [name] is a 67 y.o. female with a known history of hypertension, anemia bladder cancer with metastases to her lung and spine, recently diagnosed and started radiation and chemotherapy. She presented secondary to fever and nausea with concern for infection. Unknown source.   Assessment & Plan:   Principal Problem:   SIRS (systemic inflammatory response syndrome) (HCC) Active Problems:   HTN (hypertension)   Hyponatremia   Hyperlipidemia   SIRS No source, so not sepsis at this time. Fever and elevated WBC. Started on broad spectrum antibiotics. Patient is immunocompromised secondary to chemotherapy. Blood cultures no growth to date. Urine culture no growth (final) -Hold Cefepime and Vancomycin and watch off antbiotics -Blood culture pending  Hyponatremia Thought secondary to hydrochlorothiazine. Significantly improved. -Discontinue hydrochlorothiazide on discharge  Bladder cancer w/ metastasis to lung/bone Follows with Dr. Alen Blew as an outpatient. Currently receiving chemotherapy and radiation therapy.  Hypertension -Continue Norvasc, irbesartan, metoprolol  Hyperlipidemia -Continue Crestor  Anemia -Continue iron supplementation   DVT prophylaxis: Lovenox Code Status:   Code Status: Full Code Family Communication: Brother and sister in law at bedside Disposition Plan: Discharge pending sepsis workup   Consultants:   Hematology/Oncology  Procedures:   None  Antimicrobials:  Vancomycin  Cefepime    Subjective: Continues to feel better  Objective: Vitals:   09/04/18 0507 09/04/18 1333 09/04/18 2100 09/05/18 0456  BP: 139/67 135/71 134/69 (!) 137/58  Pulse: 71 71 72 64  Resp: 18 18 20 16   Temp: 98.3 F (36.8 C) 98.6 F (37 C) 98.7 F (37.1 C) (!) 97.5 F (36.4 C)  TempSrc: Oral Oral    SpO2: 99% 100% 96% 97%    Weight:      Height:        Intake/Output Summary (Last 24 hours) at 09/05/2018 1551 Last data filed at 09/05/2018 1545 Gross per 24 hour  Intake 1441.46 ml  Output 2250 ml  Net -808.54 ml   Filed Weights   09/03/18 2005  Weight: 77.7 kg    Examination:  General exam: Appears calm and comfortable Respiratory system: Clear to auscultation. Respiratory effort normal. Cardiovascular system: S1 & S2 heard, RRR. No murmurs, rubs, gallops or clicks. Gastrointestinal system: Abdomen is nondistended, soft and nontender. Normal bowel sounds heard. Central nervous system: Alert and oriented. No focal neurological deficits. Extremities: No edema. No calf tenderness Skin: No cyanosis. No rashes Psychiatry: Judgement and insight appear normal. Mood & affect appropriate.     Data Reviewed: I have personally reviewed following labs and imaging studies  CBC: Recent Labs  Lab 08/30/18 1316 08/31/18 0853 09/02/18 2303 09/03/18 1431 09/04/18 0432  WBC 8.5 7.4 11.1* 13.8* 11.3*  NEUTROABS  --  4.6 9.9* 13.3*  --   HGB 13.5 13.7 13.3 12.9 11.5*  HCT 40.5 41.6 39.5 37.8 34.9*  MCV 80.7 81.4 81.4 78.9 80.2  PLT 360 267 311 284 001   Basic Metabolic Panel: Recent Labs  Lab 08/31/18 0853 09/02/18 2303 09/02/18 2322 09/03/18 1431 09/03/18 2030 09/04/18 0432  NA 137 132*  --  126*  --  132*  K 4.2 4.0  --  3.6  --  4.3  CL 100 92*  --  88*  --  93*  CO2 25 27  --  28  --  29  GLUCOSE 107* 207*  --  169*  --  115*  BUN 21 27*  --  23  --  19  CREATININE 1.13* 1.17*  --  1.12*  --  1.00  CALCIUM 10.7* 10.6*  --  9.9  --  9.7  MG  --   --  2.0  --  2.0  --   PHOS  --   --   --   --  3.1  --    GFR: Estimated Creatinine Clearance: 54.1 mL/min (by C-G formula based on SCr of 1 mg/dL). Liver Function Tests: Recent Labs  Lab 08/31/18 0853 09/02/18 2303 09/03/18 1431 09/04/18 0432  AST 17 22 22 16   ALT 8 16 14 11   ALKPHOS 95 90 82 68  BILITOT 0.5 0.9 1.0 0.8  PROT 8.4*  8.2* 7.7 7.2  ALBUMIN 3.7 3.7 3.3* 2.9*   Recent Labs  Lab 09/03/18 1431  LIPASE 39   No results for input(s): AMMONIA in the last 168 hours. Coagulation Profile: Recent Labs  Lab 08/30/18 1316  INR 0.98   Cardiac Enzymes: No results for input(s): CKTOTAL, CKMB, CKMBINDEX, TROPONINI in the last 168 hours. BNP (last 3 results) No results for input(s): PROBNP in the last 8760 hours. HbA1C: No results for input(s): HGBA1C in the last 72 hours. CBG: Recent Labs  Lab 09/02/18 2311 09/04/18 2058  GLUCAP 164* 113*   Lipid Profile: No results for input(s): CHOL, HDL, LDLCALC, TRIG, CHOLHDL, LDLDIRECT in the last 72 hours. Thyroid Function Tests: No results for input(s): TSH, T4TOTAL, FREET4, T3FREE, THYROIDAB in the last 72 hours. Anemia Panel: No results for input(s): VITAMINB12, FOLATE, FERRITIN, TIBC, IRON, RETICCTPCT in the last 72 hours. Sepsis Labs: Recent Labs  Lab 09/02/18 2330 09/03/18 0121 09/03/18 1640  LATICACIDVEN 2.07* 1.42 1.25    Recent Results (from the past 240 hour(s))  Blood culture (routine x 2)     Status: None (Preliminary result)   Collection Time: 09/03/18  4:40 PM  Result Value Ref Range Status   Specimen Description   Final    BLOOD RIGHT ARM Performed at [facility] [address] [place], [place] [address]    Special Requests   Final    BOTTLES DRAWN AEROBIC AND ANAEROBIC Blood Culture adequate volume Performed at [facility] [address] [place], [place] [address]    Culture   Final    NO GROWTH 2 DAYS Performed at [facility] [address] [place], [place] [address]    Report Status PENDING  Incomplete  Blood culture (routine x 2)     Status: None (Preliminary result)   Collection Time: 09/03/18  4:45 PM  Result Value Ref Range Status   Specimen Description   Final    BLOOD LEFT ARM Performed at [facility] [address] [place], [place] [address]     Special Requests   Final    BOTTLES DRAWN AEROBIC AND ANAEROBIC Blood Culture adequate volume Performed at [facility] [address], [place], [place] [address]    Culture   Final    NO GROWTH 2 DAYS Performed at [facility] [address] [place], [place] [address]    Report Status PENDING  Incomplete  Urine culture     Status: None   Collection Time: 09/03/18  5:54 PM  Result Value Ref Range Status   Specimen Description   Final    URINE, CLEAN CATCH Performed at [facility], [place] [address] [place], [place]  [address]    Special Requests   Final    NONE Performed at [facility], [place] [address], [place], [place] [address]    Culture   Final    NO GROWTH Performed at [facility], [place] [address] [place], [place] [address]    Report Status 09/05/2018 FINAL  Final         Radiology Studies: Dg Chest 2 View  Result Date: 09/03/2018 CLINICAL DATA:  Nausea and weakness. Fever. Bladder cancer metastatic to the skeleton, lungs, and mediastinum. Recent initiation of radiation therapy 2 days ago. Chemotherapy starting 1 day ago. EXAM: CHEST - 2 VIEW COMPARISON:  Chest radiograph from 09/02/2018 FINDINGS: Power injectable right Port-A-Cath tip: SVC. Atherosclerotic calcification of the aortic arch. The patient is rotated to the right on today's radiograph, reducing diagnostic sensitivity and specificity. Bandlike density in the lingula likely from atelectasis or scarring, but this had a hypermetabolic component on the PET-CT suggesting at least a part of this is due to a metastatic nodule. The right lung appears clear although there is a known hypermetabolic pulmonary nodule medially at the right lung base in the retro diaphragmatic region which is not well seen by conventional radiography. Patient has a known sternal metastatic lesion, not well seen on today's chest radiograph. There is some minimal blunting of the  left posterior costophrenic angle and possibly the right posterior costophrenic angle. Mild enlargement of the cardiopericardial silhouette, without edema. The patient has known extensive thoracic spine metastatic disease which is poorly apparent on today's chest radiographs. IMPRESSION: 1. Stable mild cardiomegaly, without edema. 2. Mild lingular atelectasis with a known underlying hypermetabolic pulmonary nodule in this vicinity. 3. Other nodules at the right lung base and bony involvement in the sternum and thoracic spine are not readily apparent on conventional radiography. 4. Blunting of the posterior costophrenic angle suggesting small pleural effusions. 5.  Aortic Atherosclerosis (ICD10-I70.0). Electronically Signed   By: Van Clines M.D.   On: 09/03/2018 16:16   Ct Abdomen Pelvis W Contrast  Result Date: 09/03/2018 CLINICAL DATA:  History of bladder cancer. The patient is undergoing chemotherapy and radiation therapy. Nausea and vomiting. EXAM: CT ABDOMEN AND PELVIS WITH CONTRAST TECHNIQUE: Multidetector CT imaging of the abdomen and pelvis was performed using the standard protocol following bolus administration of intravenous contrast. CONTRAST:  186mL ISOVUE-300 IOPAMIDOL (ISOVUE-300) INJECTION 61% COMPARISON:  Body CT 07/21/2018 FINDINGS: Lower chest: Right lower lobe pulmonary nodule measures 1.5 cm, stable. Subpleural nodularity in the left lower lobe, mainly 12 mm soft tissue nodule along the medial lower lobe pleura, and several soft tissue nodules along the inferolateral left lower lobe pleura. Small left pleural effusion. Small hiatal hernia. Hepatobiliary: Subtle 5 mm area of hypoattenuation in the inferior aspect of the right lobe of the liver, image 28/85, sequence 2. No gallstones, gallbladder wall thickening, or biliary dilatation. Pancreas: Unremarkable. No pancreatic ductal dilatation or surrounding inflammatory changes. Spleen: Normal in size without focal abnormality.  Adrenals/Urinary Tract: Normal appearance of the adrenal glands. Benign-appearing 4.2 cm left renal cyst. 6 mm hypoattenuated nodule in the superior cortex of the right kidney, too small to be actually characterize. Stable appearance of the bladder. Stomach/Bowel: Stomach is within normal limits. No evidence of appendicitis. No evidence of bowel wall thickening, distention, or inflammatory changes. Vascular/Lymphatic: Aortic atherosclerosis. No enlarged abdominal or pelvic lymph nodes. Reproductive: Uterus and bilateral adnexa are unremarkable. Other: No abdominal wall hernia or abnormality. No abdominopelvic ascites. Musculoskeletal: Interval increase in the  soft tissue component of the lytic lesions of the L2 spinous process and left lateral process with extension to the lamina and pedicle. New lytic lesion within the inferior aspect of T12 vertebral body. Increase lytic lesion within the superior endplate of L1 vertebral body with interval development of compression deformity of the superior endplate. Enlarging lytic lesions within the anterior aspect of the L3 and L4 vertebral bodies. IMPRESSION: Worsening pulmonary metastatic disease. Mainly, interval development of suspicious subpleural nodules in the left lower lobe and lingula. Small left pleural effusion. Stable right lower lobe pulmonary nodule. Worsening skeletal metastatic disease. Interval increase in the soft tissue component of the lytic lesions of the L2 spinous process and left lateral process with extension to the lamina and pedicle. New lytic lesion within the inferior aspect of T12 vertebral body. Increase lytic lesion within the superior endplate of L1 vertebral body with interval development of compression fracture of the superior endplate. Enlarging lytic lesions within the anterior aspect of the L3 and L4 vertebral bodies. Subtle 5 mm area of hypoattenuation in the inferior aspect of right lobe of the liver, too small to be actually  characterize. Small hiatal hernia. Electronically Signed   By: Fidela Salisbury M.D.   On: 09/03/2018 16:58        Scheduled Meds: . amLODipine  10 mg Oral q morning - 10a  . enoxaparin (LOVENOX) injection  40 mg Subcutaneous Q24H  . ferrous sulfate  325 mg Oral Daily  . irbesartan  300 mg Oral Daily  . metoprolol succinate  100 mg Oral Daily  . montelukast  10 mg Oral Daily  . pantoprazole  40 mg Oral Daily  . polyethylene glycol  17 g Oral Daily  . rosuvastatin  5 mg Oral Daily   Continuous Infusions: . sodium chloride 75 mL/hr at 09/04/18 1231     LOS: 2 days     Cordelia Poche, MD [facility] 09/05/2018, 3:51 PM Pager: [phone]  If 7PM-7AM, please contact night-coverage [email or web address] 09/05/2018, 3:51 PM

## 2018-09-06 ENCOUNTER — Ambulatory Visit
Admission: RE | Admit: 2018-09-06 | Discharge: 2018-09-06 | Disposition: A | Discharge status: Home / Self Care | Payer: Medicare Other | Source: Ambulatory Visit | Attending: Radiation Oncology | Admitting: Radiation Oncology

## 2018-09-06 DIAGNOSIS — C7951 Secondary malignant neoplasm of bone: Secondary | ICD-10-CM | POA: Diagnosis not present

## 2018-09-06 DIAGNOSIS — R6510 Systemic inflammatory response syndrome (SIRS) of non-infectious origin without acute organ dysfunction: Secondary | ICD-10-CM

## 2018-09-06 LAB — GLUCOSE, CAPILLARY
Glucose-Capillary: 105 mg/dL — ABNORMAL HIGH (ref 70–99)
Glucose-Capillary: 106 mg/dL — ABNORMAL HIGH (ref 70–99)
Glucose-Capillary: 130 mg/dL — ABNORMAL HIGH (ref 70–99)
Glucose-Capillary: 108 mg/dL — ABNORMAL HIGH (ref 70–99)

## 2018-09-06 MED ORDER — PROMETHAZINE HCL 25 MG/ML IJ SOLN
12.5000 mg | Freq: Four times a day (QID) | INTRAMUSCULAR | Status: DC | PRN
  Administered 2018-09-06 – 2018-09-07 (×4): 12.5 mg via INTRAVENOUS
  Filled 2018-09-06 (×4): qty 1

## 2018-09-06 MED ORDER — FENTANYL 25 MCG/HR TD PT72
25.0000 ug | MEDICATED_PATCH | TRANSDERMAL | Status: DC
  Administered 2018-09-06: 25 ug via TRANSDERMAL
  Filled 2018-09-06: qty 1

## 2018-09-06 NOTE — Progress Notes (Addendum)
PROGRESS NOTE    Faith [name]  MCN:[id number] DOB: [birth year] DOA: 09/03/2018 PCP: Patient, No Pcp Per   Brief Narrative: Patient  is a 67 y.o. femalewith a known history of hypertension, anemia bladder cancer with metastases to her lung and spine, recently diagnosed and started radiation and chemotherapy. She presented with fever and nausea with concern for infection. Unknown source of infection.  Her hospital course was remarkable for persistent nausea, vomiting and back pain.  Assessment & Plan:   Principal Problem:   SIRS (systemic inflammatory response syndrome) (HCC) Active Problems:   HTN (hypertension)   Hyponatremia   Hyperlipidemia   SIRS No source of sepsis identified  at this time. Mild Fever and elevated WBC. She was started on broad spectrum antibiotics. Patient is immunocompromised secondary to chemotherapy. Blood cultures no growth to date. Urine culture no growth (final) -She was on  cefepime and Vancomycin and they have been discontinued -Blood culture NGTD  Hyponatremia Thought secondary to hydrochlorothiazine. Significantly improved. -Discontinue hydrochlorothiazide on discharge  Bladder cancer w/ metastasis to lung/bone Follows with Dr. Alen Blew as an outpatient. Currently receiving chemotherapy and radiation therapy.  Hypertension -Continue Norvasc, irbesartan, metoprolol  Hyperlipidemia -Continue Crestor  Anemia -Continue iron supplementation  Nausea/vomiting Most likely secondary to chemotherapy that she received  a week ago.  Started on promethazine in addition to Zofran.  Back pain Started on fentanyl patch  Debility/deconditioning: Patient eval by physical therapy today and recommended skilled nursing facility.  Will request for social worker evaluation.   DVT prophylaxis: Lovenox Code Status: Full Family Communication: None at the bedside Disposition Plan: SNF .  Nausea/vomiting and back pain should improve before  discharge.  Consultants: None  Procedures: None  Antimicrobials: None  Subjective: Patient seen and examined the bedside this morning.  Remains hemodynamically stable.  Complains of persistent nausea/vomiting and back pain.  Objective: Vitals:   09/05/18 1707 09/05/18 2055 09/06/18 0442 09/06/18 1355  BP: 123/64 126/81 130/73 (!) 144/73  Pulse: 69 72 70 72  Resp: 18 18 18 16   Temp: 98.1 F (36.7 C) 99.1 F (37.3 C) 99.1 F (37.3 C) 99 F (37.2 C)  TempSrc: Oral Oral Oral Oral  SpO2: 99% 100% 98% 94%  Weight:      Height:        Intake/Output Summary (Last 24 hours) at 09/06/2018 1359 Last data filed at 09/06/2018 0732 Gross per 24 hour  Intake 1228 ml  Output 900 ml  Net 328 ml   Filed Weights   09/03/18 2005  Weight: 77.7 kg    Examination:  General exam: Appears calm and comfortable ,Not in distress,average built,looks weak HEENT:PERRL,Oral mucosa moist, Ear/Nose normal on gross exam Respiratory system: Bilateral equal air entry, normal vesicular breath sounds, no wheezes or crackles  Cardiovascular system: S1 & S2 heard, RRR. No JVD, murmurs, rubs, gallops or clicks. No pedal edema. Gastrointestinal system: Abdomen is nondistended, soft and nontender. No organomegaly or masses felt. Normal bowel sounds heard. Central nervous system: Alert and oriented. No focal neurological deficits. Extremities: No edema, no clubbing ,no cyanosis, distal peripheral pulses palpable. Skin: No rashes, lesions or ulcers,no icterus ,no pallor MSK: Normal muscle bulk,tone ,power Psychiatry: Judgement and insight appear normal. Mood & affect appropriate.     Data Reviewed: I have personally reviewed following labs and imaging studies  CBC: Recent Labs  Lab 08/31/18 0853 09/02/18 2303 09/03/18 1431 09/04/18 0432  WBC 7.4 11.1* 13.8* 11.3*  NEUTROABS 4.6 9.9* 13.3*  --   HGB  13.7 13.3 12.9 11.5*  HCT 41.6 39.5 37.8 34.9*  MCV 81.4 81.4 78.9 80.2  PLT 267 311 284 638    Basic Metabolic Panel: Recent Labs  Lab 08/31/18 0853 09/02/18 2303 09/02/18 2322 09/03/18 1431 09/03/18 2030 09/04/18 0432  NA 137 132*  --  126*  --  132*  K 4.2 4.0  --  3.6  --  4.3  CL 100 92*  --  88*  --  93*  CO2 25 27  --  28  --  29  GLUCOSE 107* 207*  --  169*  --  115*  BUN 21 27*  --  23  --  19  CREATININE 1.13* 1.17*  --  1.12*  --  1.00  CALCIUM 10.7* 10.6*  --  9.9  --  9.7  MG  --   --  2.0  --  2.0  --   PHOS  --   --   --   --  3.1  --    GFR: Estimated Creatinine Clearance: 54.1 mL/min (by C-G formula based on SCr of 1 mg/dL). Liver Function Tests: Recent Labs  Lab 08/31/18 0853 09/02/18 2303 09/03/18 1431 09/04/18 0432  AST 17 22 22 16   ALT 8 16 14 11   ALKPHOS 95 90 82 68  BILITOT 0.5 0.9 1.0 0.8  PROT 8.4* 8.2* 7.7 7.2  ALBUMIN 3.7 3.7 3.3* 2.9*   Recent Labs  Lab 09/03/18 1431  LIPASE 39   No results for input(s): AMMONIA in the last 168 hours. Coagulation Profile: No results for input(s): INR, PROTIME in the last 168 hours. Cardiac Enzymes: No results for input(s): CKTOTAL, CKMB, CKMBINDEX, TROPONINI in the last 168 hours. BNP (last 3 results) No results for input(s): PROBNP in the last 8760 hours. HbA1C: No results for input(s): HGBA1C in the last 72 hours. CBG: Recent Labs  Lab 09/02/18 2311 09/04/18 2058 09/06/18 0742 09/06/18 1212  GLUCAP 164* 113* 105* 106*   Lipid Profile: No results for input(s): CHOL, HDL, LDLCALC, TRIG, CHOLHDL, LDLDIRECT in the last 72 hours. Thyroid Function Tests: No results for input(s): TSH, T4TOTAL, FREET4, T3FREE, THYROIDAB in the last 72 hours. Anemia Panel: No results for input(s): VITAMINB12, FOLATE, FERRITIN, TIBC, IRON, RETICCTPCT in the last 72 hours. Sepsis Labs: Recent Labs  Lab 09/02/18 2330 09/03/18 0121 09/03/18 1640  LATICACIDVEN 2.07* 1.42 1.25    Recent Results (from the past 240 hour(s))  Blood culture (routine x 2)     Status: None (Preliminary result)   Collection  Time: 09/03/18  4:40 PM  Result Value Ref Range Status   Specimen Description   Final    BLOOD RIGHT ARM Performed at [facility] [address] [place], [place] [address]    Special Requests   Final    BOTTLES DRAWN AEROBIC AND ANAEROBIC Blood Culture adequate volume Performed at [facility] [address] [place], [place] [address]    Culture   Final    NO GROWTH 3 DAYS Performed at [facility], [place] [address], [place], [place] [address]    Report Status PENDING  Incomplete  Blood culture (routine x 2)     Status: None (Preliminary result)   Collection Time: 09/03/18  4:45 PM  Result Value Ref Range Status   Specimen Description   Final    BLOOD LEFT ARM Performed at [facility] [address] [place], [place] [address]    Special Requests  Final    BOTTLES DRAWN AEROBIC AND ANAEROBIC Blood Culture adequate volume Performed at [facility] [address] [place], [place] [address]    Culture   Final    NO GROWTH 3 DAYS Performed at [facility], [place] [address], [place], [place] [address]    Report Status PENDING  Incomplete  Urine culture     Status: None   Collection Time: 09/03/18  5:54 PM  Result Value Ref Range Status   Specimen Description   Final    URINE, CLEAN CATCH Performed at [facility], [place] [address], [place], [place] [address]    Special Requests   Final    NONE Performed at [facility], [place] [address] [place], [place] [address]    Culture   Final    NO GROWTH Performed at [facility], [place] [address], [place], [place] [address]    Report Status 09/05/2018 FINAL  Final         Radiology Studies: No results found.      Scheduled Meds: . amLODipine  10 mg Oral q morning - 10a  . enoxaparin (LOVENOX) injection  40 mg Subcutaneous Q24H  . fentaNYL  25 mcg Transdermal Q72H  . ferrous sulfate  325  mg Oral Daily  . irbesartan  300 mg Oral Daily  . metoprolol succinate  100 mg Oral Daily  . montelukast  10 mg Oral Daily  . pantoprazole  40 mg Oral Daily  . polyethylene glycol  17 g Oral Daily  . rosuvastatin  5 mg Oral Daily   Continuous Infusions: . sodium chloride 75 mL/hr at 09/06/18 1012     LOS: 3 days    Time spent:25 mins. More than 50% of that time was spent in counseling and/or coordination of care.      Shelly Coss, MD [facility] Pager [phone]  If 7PM-7AM, please contact night-coverage [email or web address] Password [facility] 09/06/2018, 1:59 PM

## 2018-09-06 NOTE — Care Management Important Message (Signed)
Important Message  Patient Details  Name: Faith [name] MRN: [id number] Date of Birth: 1951-02-19   Medicare Important Message Given:  Yes    Kerin Salen 09/06/2018, 11:09 AMImportant Message  Patient Details  Name: Faith [name] MRN: [id number] Date of Birth: [birth year]   Medicare Important Message Given:  Yes    Kerin Salen 09/06/2018, 11:08 AM

## 2018-09-06 NOTE — Care Management Note (Signed)
Case Management Note  Patient Details  Name: Faith [name] MRN: [id number] Date of Birth: Aug 18, 1951  Subjective/Objective:   Return to top of Sepsis and Other Febrile Illness, without Focal Infection RRG - ISC Goal Length of Stay: Ambulatory or 3 days  Note: Goal Length of Stay assumes optimal recovery, decision making, and care. Patients may be discharged to a lower level of care (either later than or sooner than the goal) when it is appropriate for their clinical status and care needs. Discharge Readiness Return to top of Sepsis and Other Febrile Illness, without Focal Infection RRG - [place]  Discharge readiness is indicated by patient meeting Recovery Milestones, including ALL of the following: ? Hemodynamic stability ? Fever absent or reduced ? Hypoxemia absent ? Mental status at baseline ? End-organ dysfunction (eg, myocardial ischemia, renal failure) absent ? Metabolic derangement (eg, dehydration, acidosis) absent ? Cultures negative or infection identified and under adequate treatment ? Ambulatory ? Oral hydration, medications[P] ? Discharge plans and education understood See Sepsis and Other Febrile Illness, without Focal Infection Optimal Recovery CourseISC for full optimal recovery management information. Extended Stay Return to top of Sepsis and Other Febrile Illness, without Focal Infection RRG - ISC Minimal (a few hours to 1 day), Brief (1 to 3 days), Moderate (4 to 7 days), and Prolonged (more than 7 days). [Expand All / Collapse All]  Extended stay beyond goal length of stay may be needed for: ? Persistent Hypotension  Expect moderate to prolonged stay extension.  See Systemic or Infectious Condition GRGGRG guideline as appropriate. ? Positive blood cultures  Certain identified organisms may require further parenteral antibiotic treatment or change in initial antibiotic selection.  Expect brief stay extension. ? Lack of improvement on antimicrobial treatment  (eg, continued fever)  Anticipate increased antibiotic coverage, repeat cultures, and invasive and diagnostic procedures.  Expect brief stay extension. ? High-risk febrile neutropenia  Expect brief stay extension.  See Systemic or Infectious Condition GRGGRG guideline as appropriate. ? Active comorbid illness (eg, heart failure, renal failure)  Anticipate ongoing management of comorbidities.  Expect brief stay extension. ? Respiratory failure (eg, need for assisted ventilation)  Anticipate monitoring of blood gases, oxygen saturation.  Expect brief stay extension. ? Insufficient oral intake  Inability to maintain hydration or take oral medications may require continued inpatient IV support or arrangement for such support elsewhere.  Expect brief stay extension. ?  Other complication or condition Return to top of Sepsis and Other Febrile Illness, without Focal Infection RRG - ISC Goal Length of Stay: Ambulatory or 3 days  Note: Goal Length of Stay assumes optimal recovery, decision making, and care. Patients may be discharged to a lower level of care (either later than or sooner than the goal) when it is appropriate for their clinical status and care needs. Discharge Readiness Return to top of Sepsis and Other Febrile Illness, without Focal Infection RRG - [place]  Discharge readiness is indicated by patient meeting Recovery Milestones, including ALL of the following: ? Hemodynamic stability ? Fever absent or reduced ? Hypoxemia absent ? Mental status at baseline ? End-organ dysfunction (eg, myocardial ischemia, renal failure) absent ? Metabolic derangement (eg, dehydration, acidosis) absent ? Cultures negative or infection identified and under adequate treatment ? Ambulatory ? Oral hydration, medications[P] ? Discharge plans and education understood See Sepsis and Other Febrile Illness, without Focal Infection Optimal Recovery CourseISC for full optimal recovery management  information. Extended Stay Return to top of Sepsis and Other Febrile Illness, without Focal Infection  RRG - ISC Minimal (a few hours to 1 day), Brief (1 to 3 days), Moderate (4 to 7 days), and Prolonged (more than 7 days). [Expand All / Collapse All]  Extended stay beyond goal length of stay may be needed for: ? Persistent Hypotension  Expect moderate to prolonged stay extension.  See Systemic or Infectious Condition GRGGRG guideline as appropriate. ? Positive blood cultures  Certain identified organisms may require further parenteral antibiotic treatment or change in initial antibiotic selection.  Expect brief stay extension. ? Lack of improvement on antimicrobial treatment (eg, continued fever)  Anticipate increased antibiotic coverage, repeat cultures, and invasive and diagnostic procedures.  Expect brief stay extension. ? High-risk febrile neutropenia  Expect brief stay extension.  See Systemic or Infectious Condition GRGGRG guideline as appropriate. ? Active comorbid illness (eg, heart failure, renal failure)  Anticipate ongoing management of comorbidities.  Expect brief stay extension. ? Respiratory failure (eg, need for assisted ventilation)  Anticipate monitoring of blood gases, oxygen saturation.  Expect brief stay extension. ? Insufficient oral intake  Inability to maintain hydration or take oral medications may require continued inpatient IV support or arrangement for such support elsewhere.  Expect brief stay extension. ?  Other complication or condition                  Action/Plan: Plan to discharge home with [facility] for HHPT/NA/PT.   Expected Discharge Date:                  Expected Discharge Plan:  [place]  In-House Referral:  Clinical Social Work  Discharge planning Services  CM Consult  Post Acute Care Choice:    Choice offered to:     DME Arranged:    DME Agency:     HH Arranged:  RN, PT, Nurse's Aide [place]  Agency:  [facility]  Status of Service:  Completed, signed off  If discussed at [place] of Stay Meetings, dates discussed:    Additional CommentsPurcell Mouton, RN 09/06/2018, 11:32 AM

## 2018-09-06 NOTE — Progress Notes (Signed)
Physical Therapy Treatment Patient Details Name: Faith [name] MRN: [id number] DOB: 01-Mar-1951 Today's Date: 09/06/2018    History of Present Illness 67 year old woman with metastatic bladder cancer with disease to the bone, mediastinum and lung diagnosed in August 2019.  She is currently receiving systemic chemotherapy utilizing gemcitabine and cisplatin and received the first cycle of chemotherapy on 08/31/2018.  She presented on 09/03/2018 with complaints of fever and impending sepsis    PT Comments    Pt assisted with bed mobility due to pain.  Pt ambulated short distance in hallway however observed more ataxic gait with LEs buckling today.  Follow up recommendation updated to SNF as pt would be alone during the day if d/c home and presents as HIGH fall risk at this time.   Follow Up Recommendations  SNF;Supervision/Assistance - 24 hour     Equipment Recommendations  Rolling walker with 5" wheels    Recommendations for Other Services       Precautions / Restrictions Precautions Precautions: Fall    Mobility  Bed Mobility Overal bed mobility: Needs Assistance Bed Mobility: Rolling;Sidelying to Sit;Sit to Sidelying Rolling: Min guard Sidelying to sit: Mod assist     Sit to sidelying: Mod assist General bed mobility comments: encouraged log roll technique, assist required for trunk upright due to pain, required assist for LEs onto bed  Transfers Overall transfer level: Needs assistance Equipment used: Rolling walker (2 wheeled) Transfers: Sit to/from Stand Sit to Stand: Min assist         General transfer comment: verbal cues for technique, assist to rise  Ambulation/Gait Ambulation/Gait assistance: Mod assist Gait Distance (Feet): 40 Feet Assistive device: Rolling walker (2 wheeled) Gait Pattern/deviations: Step-through pattern;Decreased stride length;Scissoring;Ataxic Gait velocity: decr   General Gait Details: poor coordination of LEs observed today, pt also  with occasional buckling of LEs requiring mod assist, cues for use of UEs through RW for more self assist   Stairs             Wheelchair Mobility    Modified Rankin (Stroke Patients Only)       Balance Overall balance assessment: Needs assistance         Standing balance support: Bilateral upper extremity supported Standing balance-Leahy Scale: Poor                              Cognition Arousal/Alertness: Awake/alert Behavior During Therapy: Flat affect Overall Cognitive Status: Within Functional Limits for tasks assessed                                        Exercises      General Comments        Pertinent Vitals/Pain Pain Assessment: Faces Faces Pain Scale: Hurts whole lot Pain Location: radiation site area Pain Descriptors / Indicators: Grimacing;Guarding(worse with transitional movements) Pain Intervention(s): Monitored during session;Limited activity within patient's tolerance;Repositioned(RN reports pt prefers not to take pain pills, started pain patch about an hour ago)    Home Living                      Prior Function            PT Goals (current goals can now be found in the care plan section) Progress towards PT goals: Progressing toward goals    Frequency  Min 3X/week      PT Plan Discharge plan needs to be updated    Co-evaluation              AM-PAC PT "6 Clicks" Daily Activity  Outcome Measure  Difficulty turning over in bed (including adjusting bedclothes, sheets and blankets)?: A Lot Difficulty moving from lying on back to sitting on the side of the bed? : Unable Difficulty sitting down on and standing up from a chair with arms (e.g., wheelchair, bedside commode, etc,.)?: Unable Help needed moving to and from a bed to chair (including a wheelchair)?: A Little Help needed walking in hospital room?: A Lot Help needed climbing 3-5 steps with a railing? : Total 6 Click Score:  10    End of Session Equipment Utilized During Treatment: Gait belt Activity Tolerance: Patient limited by fatigue;Patient limited by pain Patient left: in bed;with bed alarm set;with call bell/phone within reach;with nursing/sitter in room Nurse Communication: Mobility status PT Visit Diagnosis: Other abnormalities of gait and mobility (R26.89)     Time: 2956-2130 PT Time Calculation (min) (ACUTE ONLY): 30 min  Charges:  $Gait Training: 8-22 mins                     Carmelia Bake, PT, DPT Acute Rehabilitation Services Office: [phone] Pager: [place] 09/06/2018, 3:56 PM

## 2018-09-06 NOTE — Plan of Care (Signed)
Patient medicated for nausea x 3 this shift with some improvement (Phenergan added this morning), able to tolerate some bland foods such as yogurt, popsicles.  Patient in significant pain with any movement, Fentanyl patch applied as per order to right arm.

## 2018-09-07 ENCOUNTER — Telehealth: Payer: Self-pay | Admitting: Oncology

## 2018-09-07 ENCOUNTER — Ambulatory Visit: Payer: Medicare Other

## 2018-09-07 ENCOUNTER — Ambulatory Visit
Admission: RE | Admit: 2018-09-07 | Discharge: 2018-09-07 | Disposition: A | Discharge status: Home / Self Care | Payer: Medicare Other | Source: Ambulatory Visit | Attending: Radiation Oncology | Admitting: Radiation Oncology

## 2018-09-07 ENCOUNTER — Inpatient Hospital Stay: Payer: Medicare Other

## 2018-09-07 ENCOUNTER — Inpatient Hospital Stay: Payer: Medicare Other | Admitting: Oncology

## 2018-09-07 LAB — BASIC METABOLIC PANEL
Anion gap: 10 (ref 5–15)
BUN: 14 mg/dL (ref 8–23)
CHLORIDE: 97 mmol/L — AB (ref 98–111)
CO2: 27 mmol/L (ref 22–32)
Calcium: 9 mg/dL (ref 8.9–10.3)
Creatinine, Ser: 0.77 mg/dL (ref 0.44–1.00)
GFR calc Af Amer: >60 mL/min (ref >60)
GFR calc non Af Amer: >60 mL/min (ref >60)
Glucose, Bld: 101 mg/dL — ABNORMAL HIGH (ref 70–99)
POTASSIUM: 3.2 mmol/L — AB (ref 3.5–5.1)
Sodium: 134 mmol/L — ABNORMAL LOW (ref 135–145)

## 2018-09-07 LAB — CBC WITH DIFFERENTIAL/PLATELET
Basophils Absolute: 0 10*3/uL (ref 0.0–0.1)
Basophils Relative: 0 %
EOS PCT: 2 %
Eosinophils Absolute: 0.1 10*3/uL (ref 0.0–0.7)
HCT: 29.9 % — ABNORMAL LOW (ref 36.0–46.0)
Hemoglobin: 10.1 g/dL — ABNORMAL LOW (ref 12.0–15.0)
LYMPHS ABS: 0.5 10*3/uL — AB (ref 0.7–4.0)
LYMPHS PCT: 16 %
MCH: 26.9 pg (ref 26.0–34.0)
MCHC: 33.8 g/dL (ref 30.0–36.0)
MCV: 79.7 fL (ref 78.0–100.0)
MONO ABS: 0.1 10*3/uL (ref 0.1–1.0)
Monocytes Relative: 3 %
Neutro Abs: 2.3 10*3/uL (ref 1.7–7.7)
Neutrophils Relative %: 79 %
PLATELETS: 155 10*3/uL (ref 150–400)
RBC: 3.75 MIL/uL — AB (ref 3.87–5.11)
RDW: 12.7 % (ref 11.5–15.5)
WBC: 3 10*3/uL — ABNORMAL LOW (ref 4.0–10.5)

## 2018-09-07 LAB — GLUCOSE, CAPILLARY
Glucose-Capillary: 115 mg/dL — ABNORMAL HIGH (ref 70–99)
Glucose-Capillary: 130 mg/dL — ABNORMAL HIGH (ref 70–99)

## 2018-09-07 MED ORDER — OXYCODONE-ACETAMINOPHEN 5-325 MG PO TABS
1.0000 | ORAL_TABLET | Freq: Four times a day (QID) | ORAL | Status: DC | PRN
  Administered 2018-09-07 – 2018-09-08 (×6): 1 via ORAL
  Filled 2018-09-07 (×6): qty 1

## 2018-09-07 MED ORDER — POTASSIUM CHLORIDE CRYS ER 20 MEQ PO TBCR
40.0000 meq | EXTENDED_RELEASE_TABLET | Freq: Once | ORAL | Status: AC
  Administered 2018-09-07: 40 meq via ORAL
  Filled 2018-09-07: qty 2

## 2018-09-07 MED ORDER — SODIUM CHLORIDE 0.9% FLUSH
10.0000 mL | INTRAVENOUS | Status: DC | PRN
  Administered 2018-09-07: 40 mL

## 2018-09-07 MED ORDER — ALTEPLASE 2 MG IJ SOLR
2.0000 mg | Freq: Once | INTRAMUSCULAR | Status: AC
  Administered 2018-09-07: 2 mg
  Filled 2018-09-07: qty 2

## 2018-09-07 NOTE — Telephone Encounter (Signed)
Spoke to pts daughter regarding upcoming appts. Pt is in hospital.

## 2018-09-07 NOTE — Clinical Social Work Note (Signed)
Clinical Social Work Assessment  Patient Details  Name: Faith [name] MRN: [id number] Date of Birth: 12-Oct-1951  Date of referral:  09/07/18               Reason for consult:  Facility Placement                Permission sought to share information with:  Facility Sport and exercise psychologist, Family Supports Permission granted to share information::  Yes, Verbal Permission Granted  Name::     Lisette Grinder  Agency::     Relationship::  sister  Contact Information:  [phone]  Housing/Transportation Living arrangements for the past 2 months:  Single Family Home Source of Information:  Patient Patient Interpreter Needed:  None Criminal Activity/Legal Involvement Pertinent to Current Situation/Hospitalization:  No - Comment as needed Significant Relationships:  Adult Children, Other Family Members, Siblings Lives with:  Adult Children(son) Do you feel safe going back to the place where you live?  (PT recommending SNF) Need for family participation in patient care:  No (Coment)  Care giving concerns:  Patient from home with son. Patient reported that her son is out the home from 8:30am until 7:00PM daily. Patient reported that prior to hospitalization she needed assistance with ADLs, noting all of her family would come over to help. Patient reported that she is currently receiving radiation and that her family has been transporting her, patient reported that she was receiving chemotherapy but it has been postponed this week. PT recommending SNF; Supervision/Assistance 24 hour.    Social Worker assessment / plan:  CSW spoke with patient at bedside regarding PT recommendation for SNF and discharge planning. CSW explained SNF placement process and insurance authorization, patient verbalized understanding. Patient reported that she was agreeable to SNF because she needs it. CSW agreed to complete patient's FL2 and initiate insurance authorization.   CSW completed patient's FL2 and will follow up  with bed offers. CSW initiated patient's insurance authorization.  Employment status:  Part-Time Nurse, adult PT Recommendations:  [place] / Referral to community resources:  [place]  Patient/Family's Response to care: Patient appreciative of CSW assistance with discharge planning.  Patient/Family's Understanding of and Emotional Response to Diagnosis, Current Treatment, and Prognosis:  Patient presented calm and verbalized understanding of current treatment plan. Patient verbalized plan to dc to SNF for ST rehab prior to returning home.  Emotional Assessment Appearance:  Appears stated age Attitude/Demeanor/Rapport:  Other(cooperative) Affect (typically observed):  Appropriate, Calm Orientation:  Oriented to Situation, Oriented to Self, Oriented to Place, Oriented to  Time Alcohol / Substance use:  Not Applicable Psych involvement (Current and /or in the community):  No (Comment)  Discharge Needs  Concerns to be addressed:  Care Coordination Readmission within the last 30 days:  No Current discharge risk:  Lack of support system Barriers to Discharge:  Continued Medical Work up, [employer], LCSW 09/07/2018, 11:39 AM

## 2018-09-07 NOTE — Progress Notes (Signed)
PROGRESS NOTE    Faith [name]  LPF:[id number] DOB: [birth year] DOA: 09/03/2018 PCP: Patient, No Pcp Per   Brief Narrative: Patient  is a 67 y.o. femalewith a known history of hypertension, anemia bladder cancer with metastases to her lung and spine, recently diagnosed and started radiation and chemotherapy. She presented with fever and nausea with concern for infection. Source of infection remained unknown.  Her hospital course was remarkable for persistent nausea, vomiting and back pain. She is hemodynamically stable for discharge to skilled nursing facility as soon as the bed is available.  Assessment & Plan:   Principal Problem:   SIRS (systemic inflammatory response syndrome) (HCC) Active Problems:   HTN (hypertension)   Hyponatremia   Hyperlipidemia   SIRS No source of sepsis identified  at this time.She presented with fever and leucocytosis.She was started on broad spectrum antibiotics. Patient is immunocompromised secondary to chemotherapy. Blood cultures no growth to date. Urine culture no growth (final) She was on  cefepime and Vancomycin and they have been discontinued Blood culture NGTD  Hyponatremia Thought secondary to hydrochlorothiazine. Significantly improved. Discontinue hydrochlorothiazide on discharge  Bladder cancer w/ metastasis to lung/bone Follows with Dr. Alen Blew as an outpatient. Currently receiving chemotherapy and radiation therapy.  Hypertension Continue Norvasc, irbesartan, metoprolol  Hyperlipidemia Continue Crestor  Anemia Continue iron supplementation  Nausea/vomiting Most likely secondary to chemotherapy that she received  a week ago.  Started on promethazine in addition to Zofran.  Nausea and vomiting have improved.  Back pain Started on fentanyl patch.  Back pain has improved.  Debility/deconditioning: Patient eval by physical therapy today and recommended skilled nursing facility.  Will request for social worker  evaluation.   DVT prophylaxis: Lovenox Code Status: Full Family Communication: None at the bedside Disposition Plan: SNF as soon as the bed is available .   Consultants: None  Procedures: None  Antimicrobials: None  Subjective: Patient seen and examined the bedside this morning.  Remains hemodynamically stable.  Improvement in nausea/vomiting and back pain.  She still has some back pain though.  Undergoing radiation therapy today  Objective: Vitals:   09/06/18 0442 09/06/18 1355 09/06/18 2057 09/07/18 0531  BP: 130/73 (!) 144/73 130/70 132/73  Pulse: 70 72 71 72  Resp: 18 16 18 18   Temp: 99.1 F (37.3 C) 99 F (37.2 C) 98.6 F (37 C) 98.6 F (37 C)  TempSrc: Oral Oral Oral Oral  SpO2: 98% 94% 95% 94%  Weight:      Height:        Intake/Output Summary (Last 24 hours) at 09/07/2018 1322 Last data filed at 09/07/2018 0600 Gross per 24 hour  Intake 1760.38 ml  Output 300 ml  Net 1460.38 ml   Filed Weights   09/03/18 2005  Weight: 77.7 kg    Examination:  General exam: Appears calm and comfortable ,Not in distress,average built,looks weak HEENT:PERRL,Oral mucosa moist, Ear/Nose normal on gross exam Respiratory system: Bilateral equal air entry, normal vesicular breath sounds, no wheezes or crackles  Cardiovascular system: S1 & S2 heard, RRR. No JVD, murmurs, rubs, gallops or clicks. No pedal edema. Gastrointestinal system: Abdomen is nondistended, soft and nontender. No organomegaly or masses felt. Normal bowel sounds heard. Central nervous system: Alert and oriented. No focal neurological deficits. Extremities: No edema, no clubbing ,no cyanosis, distal peripheral pulses palpable. Skin: No rashes, lesions or ulcers,no icterus ,no pallor MSK: Normal muscle bulk,tone ,power Psychiatry: Judgement and insight appear normal. Mood & affect appropriate.     Data Reviewed:  I have personally reviewed following labs and imaging studies  CBC: Recent Labs  Lab  09/02/18 2303 09/03/18 1431 09/04/18 0432 09/07/18 0411  WBC 11.1* 13.8* 11.3* 3.0*  NEUTROABS 9.9* 13.3*  --  2.3  HGB 13.3 12.9 11.5* 10.1*  HCT 39.5 37.8 34.9* 29.9*  MCV 81.4 78.9 80.2 79.7  PLT 311 284 233 373   Basic Metabolic Panel: Recent Labs  Lab 09/02/18 2303 09/02/18 2322 09/03/18 1431 09/03/18 2030 09/04/18 0432 09/07/18 0411  NA 132*  --  126*  --  132* 134*  K 4.0  --  3.6  --  4.3 3.2*  CL 92*  --  88*  --  93* 97*  CO2 27  --  28  --  29 27  GLUCOSE 207*  --  169*  --  115* 101*  BUN 27*  --  23  --  19 14  CREATININE 1.17*  --  1.12*  --  1.00 0.77  CALCIUM 10.6*  --  9.9  --  9.7 9.0  MG  --  2.0  --  2.0  --   --   PHOS  --   --   --  3.1  --   --    GFR: Estimated Creatinine Clearance: 67.6 mL/min (by C-G formula based on SCr of 0.77 mg/dL). Liver Function Tests: Recent Labs  Lab 09/02/18 2303 09/03/18 1431 09/04/18 0432  AST 22 22 16   ALT 16 14 11   ALKPHOS 90 82 68  BILITOT 0.9 1.0 0.8  PROT 8.2* 7.7 7.2  ALBUMIN 3.7 3.3* 2.9*   Recent Labs  Lab 09/03/18 1431  LIPASE 39   No results for input(s): AMMONIA in the last 168 hours. Coagulation Profile: No results for input(s): INR, PROTIME in the last 168 hours. Cardiac Enzymes: No results for input(s): CKTOTAL, CKMB, CKMBINDEX, TROPONINI in the last 168 hours. BNP (last 3 results) No results for input(s): PROBNP in the last 8760 hours. HbA1C: No results for input(s): HGBA1C in the last 72 hours. CBG: Recent Labs  Lab 09/06/18 0742 09/06/18 1212 09/06/18 1615 09/06/18 2058 09/07/18 1140  GLUCAP 105* 106* 130* 108* 115*   Lipid Profile: No results for input(s): CHOL, HDL, LDLCALC, TRIG, CHOLHDL, LDLDIRECT in the last 72 hours. Thyroid Function Tests: No results for input(s): TSH, T4TOTAL, FREET4, T3FREE, THYROIDAB in the last 72 hours. Anemia Panel: No results for input(s): VITAMINB12, FOLATE, FERRITIN, TIBC, IRON, RETICCTPCT in the last 72 hours. Sepsis Labs: Recent Labs   Lab 09/02/18 2330 09/03/18 0121 09/03/18 1640  LATICACIDVEN 2.07* 1.42 1.25    Recent Results (from the past 240 hour(s))  Blood culture (routine x 2)     Status: None (Preliminary result)   Collection Time: 09/03/18  4:40 PM  Result Value Ref Range Status   Specimen Description   Final    BLOOD RIGHT ARM Performed at [facility] [address] [place], [place] [address]    Special Requests   Final    BOTTLES DRAWN AEROBIC AND ANAEROBIC Blood Culture adequate volume Performed at [facility] [address], [place], [place] [address]    Culture   Final    NO GROWTH 4 DAYS Performed at [facility], [place] [address], [place], [place] [address]    Report Status PENDING  Incomplete  Blood culture (routine x 2)     Status: None (Preliminary result)   Collection Time: 09/03/18  4:45 PM  Result Value Ref  Range Status   Specimen Description   Final    BLOOD LEFT ARM Performed at [facility] [address] [place], [place] [address]    Special Requests   Final    BOTTLES DRAWN AEROBIC AND ANAEROBIC Blood Culture adequate volume Performed at [facility] [address], [place], [place] [address]    Culture   Final    NO GROWTH 4 DAYS Performed at [facility], [place] [address] [place], [place] [address]    Report Status PENDING  Incomplete  Urine culture     Status: None   Collection Time: 09/03/18  5:54 PM  Result Value Ref Range Status   Specimen Description   Final    URINE, CLEAN CATCH Performed at [facility], [place] [address], [place], [place] [address]    Special Requests   Final    NONE Performed at [facility], [place] [address], [place], [place] [address]    Culture   Final    NO GROWTH Performed at [facility], [place] [address], [place], [place] [address]    Report Status 09/05/2018 FINAL  Final         Radiology  Studies: No results found.      Scheduled Meds: . alteplase  2 mg Intracatheter Once  . amLODipine  10 mg Oral q morning - 10a  . enoxaparin (LOVENOX) injection  40 mg Subcutaneous Q24H  . fentaNYL  25 mcg Transdermal Q72H  . ferrous sulfate  325 mg Oral Daily  . irbesartan  300 mg Oral Daily  . metoprolol succinate  100 mg Oral Daily  . montelukast  10 mg Oral Daily  . pantoprazole  40 mg Oral Daily  . polyethylene glycol  17 g Oral Daily  . rosuvastatin  5 mg Oral Daily   Continuous Infusions: . sodium chloride 75 mL/hr at 09/07/18 0600     LOS: 4 days    Time spent:25 mins. More than 50% of that time was spent in counseling and/or coordination of care.      Shelly Coss, MD [facility] Pager [phone]  If 7PM-7AM, please contact night-coverage [email or web address] Password [email or web address] 09/07/2018, 1:22 PM

## 2018-09-07 NOTE — Telephone Encounter (Signed)
Pt appts canceled due to pt being in hosp. Transferred daughters call to desk nurse.

## 2018-09-07 NOTE — [place] FL2 (Signed)
[place] LEVEL OF CARE SCREENING TOOL     IDENTIFICATION  Patient Name: Faith [name] Birthdate: 1951-03-04 Sex: female Admission Date (Current Location): 09/03/2018  [facility] and [place] Number:  Herbalist and Address:  [facility],  [place] [address], [place]      Provider Number: [id number]  Attending Physician Name and Address:  Shelly Coss, MD  Relative Name and Phone Number:       Current Level of Care: Hospital Recommended Level of Care: [place] Prior Approval Number:    Date Approved/Denied:   PASRR Number: [id number]  Discharge Plan: SNF    Current Diagnoses: Patient Active Problem List   Diagnosis Date Noted  . Bone metastasis ([place]) 09/04/2018  . Hyponatremia 09/04/2018  . Hyperlipidemia 09/04/2018  . SIRS (systemic inflammatory response syndrome) ([place]) 09/03/2018  . Goals of care, counseling/discussion 08/18/2018  . Urothelial carcinoma of bladder ([place]) 07/28/2018  . Acute blood loss anemia 11/25/2016  . HTN (hypertension) 11/25/2016  . Gross hematuria 11/25/2016    Orientation RESPIRATION BLADDER Height & Weight     Self, Time, Situation, Place  Normal Continent Weight: 171 lb 4.8 oz (77.7 kg) Height:  5\' 2"  (157.5 cm)  BEHAVIORAL SYMPTOMS/MOOD NEUROLOGICAL BOWEL NUTRITION STATUS        Diet(Heart)  AMBULATORY STATUS COMMUNICATION OF NEEDS Skin   Extensive Assist Verbally Normal                       Personal Care Assistance Level of Assistance  Bathing, Feeding, Dressing Bathing Assistance: Maximum assistance Feeding assistance: Independent Dressing Assistance: Maximum assistance     Functional Limitations Info  Sight, Hearing, Speech Sight Info: Adequate Hearing Info: Adequate Speech Info: Adequate    SPECIAL CARE FACTORS FREQUENCY  PT (By licensed PT), OT (By licensed OT)     PT Frequency: 5x/week OT Frequency: 5x/week            Contractures  Contractures Info: Not present    Additional Factors Info  Code Status, Allergies Code Status Info: Full Code Allergies Info: Tramadol           Current Medications (09/07/2018):  This is the current hospital active medication list Current Facility-Administered Medications  Medication Dose Route Frequency Provider Last Rate Last Dose  . 0.9 %  sodium chloride infusion   Intravenous Continuous Hugelmeyer, Alexis, DO 75 mL/hr at 09/07/18 0600    . acetaminophen (TYLENOL) tablet 650 mg  650 mg Oral Q6H PRN Hugelmeyer, Alexis, DO   650 mg at 09/05/18 1148   Or  . acetaminophen (TYLENOL) suppository 650 mg  650 mg Rectal Q6H PRN Hugelmeyer, Alexis, DO      . alteplase (CATHFLO ACTIVASE) injection 2 mg  2 mg Intracatheter Once Shelly Coss, MD      . amLODipine (NORVASC) tablet 10 mg  10 mg Oral q morning - 10a Hugelmeyer, Alexis, DO   10 mg at 09/07/18 1039  . enoxaparin (LOVENOX) injection 40 mg  40 mg Subcutaneous Q24H Hugelmeyer, Alexis, DO   40 mg at 09/07/18 1039  . fentaNYL (DURAGESIC - dosed mcg/hr) patch 25 mcg  25 mcg Transdermal Q72H Shelly Coss, MD   25 mcg at 09/06/18 1318  . ferrous sulfate tablet 325 mg  325 mg Oral Daily Hugelmeyer, Alexis, DO   325 mg at 09/07/18 1038  . ipratropium-albuterol (DUONEB) 0.5-2.5 (3) MG/3ML nebulizer solution 3 mL  3 mL Nebulization Q4H PRN Hugelmeyer,  Alexis, DO      . irbesartan (AVAPRO) tablet 300 mg  300 mg Oral Daily Hugelmeyer, Alexis, DO   300 mg at 09/07/18 1038  . lidocaine-prilocaine (EMLA) cream 1 application  1 application Topical PRN Hugelmeyer, Alexis, DO      . metoprolol succinate (TOPROL-XL) 24 hr tablet 100 mg  100 mg Oral Daily Hugelmeyer, Alexis, DO   100 mg at 09/07/18 1038  . montelukast (SINGULAIR) tablet 10 mg  10 mg Oral Daily Hugelmeyer, Alexis, DO   10 mg at 09/07/18 1038  . ondansetron (ZOFRAN) tablet 4 mg  4 mg Oral Q6H PRN Hugelmeyer, Alexis, DO       Or  . ondansetron (ZOFRAN) injection 4 mg  4 mg Intravenous  Q6H PRN Hugelmeyer, Alexis, DO   4 mg at 09/06/18 1030  . oxyCODONE-acetaminophen (PERCOCET/ROXICET) 5-325 MG per tablet 1 tablet  1 tablet Oral Q6H PRN Shelly Coss, MD   1 tablet at 09/07/18 1038  . pantoprazole (PROTONIX) EC tablet 40 mg  40 mg Oral Daily Hugelmeyer, Alexis, DO   40 mg at 09/07/18 1038  . polyethylene glycol (MIRALAX / GLYCOLAX) packet 17 g  17 g Oral Daily Mariel Aloe, MD   17 g at 09/07/18 1039  . promethazine (PHENERGAN) injection 12.5 mg  12.5 mg Intravenous Q6H PRN Shelly Coss, MD   12.5 mg at 09/07/18 0515  . rosuvastatin (CRESTOR) tablet 5 mg  5 mg Oral Daily Hugelmeyer, Alexis, DO   5 mg at 09/07/18 1038  . senna-docusate (Senokot-S) tablet 1 tablet  1 tablet Oral Daily PRN Hugelmeyer, Alexis, DO   1 tablet at 09/04/18 1010  . sodium chloride flush (NS) 0.9 % injection 10-40 mL  10-40 mL Intracatheter PRN Shelly Coss, MD   40 mL at 09/07/18 0340     Discharge Medications: Please see discharge summary for a list of discharge medications.  Relevant Imaging Results:  Relevant Lab Results:   Additional Information SSN [id number]  Burnis Medin, LCSW

## 2018-09-07 NOTE — Telephone Encounter (Signed)
Unable to reach pt

## 2018-09-07 NOTE — Clinical Social Work Placement (Signed)
Patient provided with bed offers and selected [facility]. CSW awaiting return call from [facility] to confirm patient's bed offer. Patient's BCBS Medicare authorization still pending. CSW will continue to follow and assist with discharge planning.  CLINICAL SOCIAL WORK PLACEMENT  NOTE  Date:  09/07/2018  Patient Details  Name: Faith [name] MRN: [id number] Date of Birth: [birth year]  Clinical Social Work is seeking post-discharge placement for this patient at the [place] level of care (*CSW will initial, date and re-position this form in  chart as items are completed):  Yes   Patient/family provided with [place] Work Department's list of facilities offering this level of care within the geographic area requested by the patient (or if unable, by the patient's family).  Yes   Patient/family informed of their freedom to choose among providers that offer the needed level of care, that participate in Medicare, Medicaid or managed care program needed by the patient, have an available bed and are willing to accept the patient.  Yes   Patient/family informed of [place]'s ownership interest in [facility] and [facility], as well as of the fact that they are under no obligation to receive care at these facilities.  PASRR submitted to EDS on       PASRR number received on 09/07/18     Existing PASRR number confirmed on       FL2 transmitted to all facilities in geographic area requested by pt/family on 09/07/18     FL2 transmitted to all facilities within larger geographic area on       Patient informed that his/her managed care company has contracts with or will negotiate with certain facilities, including the following:        Yes   Patient/family informed of bed offers received.  Patient chooses bed at [facility]     Physician recommends and patient chooses bed at      Patient to be transferred to   on   .  Patient to be transferred to facility by       Patient family notified on   of transfer.  Name of family member notified:        PHYSICIAN       Additional Comment:    _______________________________________________ Burnis Medin, LCSW 09/07/2018, 2:49 PM

## 2018-09-08 ENCOUNTER — Ambulatory Visit: Payer: Medicare Other

## 2018-09-08 ENCOUNTER — Ambulatory Visit
Admission: RE | Admit: 2018-09-08 | Discharge: 2018-09-08 | Disposition: A | Discharge status: Home / Self Care | Payer: Medicare Other | Source: Ambulatory Visit | Attending: Radiation Oncology | Admitting: Radiation Oncology

## 2018-09-08 DIAGNOSIS — C781 Secondary malignant neoplasm of mediastinum: Secondary | ICD-10-CM | POA: Diagnosis not present

## 2018-09-08 DIAGNOSIS — C679 Malignant neoplasm of bladder, unspecified: Secondary | ICD-10-CM | POA: Diagnosis not present

## 2018-09-08 DIAGNOSIS — I10 Essential (primary) hypertension: Secondary | ICD-10-CM | POA: Diagnosis not present

## 2018-09-08 DIAGNOSIS — Z923 Personal history of irradiation: Secondary | ICD-10-CM | POA: Diagnosis not present

## 2018-09-08 DIAGNOSIS — R6510 Systemic inflammatory response syndrome (SIRS) of non-infectious origin without acute organ dysfunction: Secondary | ICD-10-CM | POA: Diagnosis not present

## 2018-09-08 DIAGNOSIS — R279 Unspecified lack of coordination: Secondary | ICD-10-CM | POA: Diagnosis not present

## 2018-09-08 DIAGNOSIS — R531 Weakness: Secondary | ICD-10-CM | POA: Diagnosis not present

## 2018-09-08 DIAGNOSIS — C7951 Secondary malignant neoplasm of bone: Secondary | ICD-10-CM | POA: Diagnosis not present

## 2018-09-08 DIAGNOSIS — Z743 Need for continuous supervision: Secondary | ICD-10-CM | POA: Diagnosis not present

## 2018-09-08 DIAGNOSIS — M545 Low back pain: Secondary | ICD-10-CM | POA: Diagnosis not present

## 2018-09-08 DIAGNOSIS — Z5111 Encounter for antineoplastic chemotherapy: Secondary | ICD-10-CM | POA: Diagnosis not present

## 2018-09-08 DIAGNOSIS — C7800 Secondary malignant neoplasm of unspecified lung: Secondary | ICD-10-CM | POA: Diagnosis not present

## 2018-09-08 DIAGNOSIS — Z79899 Other long term (current) drug therapy: Secondary | ICD-10-CM | POA: Diagnosis not present

## 2018-09-08 DIAGNOSIS — Z510 Encounter for antineoplastic radiation therapy: Secondary | ICD-10-CM | POA: Diagnosis not present

## 2018-09-08 DIAGNOSIS — R110 Nausea: Secondary | ICD-10-CM | POA: Diagnosis not present

## 2018-09-08 DIAGNOSIS — R270 Ataxia, unspecified: Secondary | ICD-10-CM | POA: Diagnosis not present

## 2018-09-08 LAB — BASIC METABOLIC PANEL
Anion gap: 8 (ref 5–15)
BUN: 11 mg/dL (ref 8–23)
CHLORIDE: 98 mmol/L (ref 98–111)
CO2: 28 mmol/L (ref 22–32)
CREATININE: 0.76 mg/dL (ref 0.44–1.00)
Calcium: 8.7 mg/dL — ABNORMAL LOW (ref 8.9–10.3)
GFR calc non Af Amer: >60 mL/min (ref >60)
Glucose, Bld: 106 mg/dL — ABNORMAL HIGH (ref 70–99)
POTASSIUM: 3.7 mmol/L (ref 3.5–5.1)
Sodium: 134 mmol/L — ABNORMAL LOW (ref 135–145)

## 2018-09-08 LAB — CBC WITH DIFFERENTIAL/PLATELET
BASOS PCT: 1 %
Basophils Absolute: 0 10*3/uL (ref 0.0–0.1)
EOS ABS: 0.1 10*3/uL (ref 0.0–0.7)
Eosinophils Relative: 2 %
HCT: 29 % — ABNORMAL LOW (ref 36.0–46.0)
HEMOGLOBIN: 9.6 g/dL — AB (ref 12.0–15.0)
Lymphocytes Relative: 14 %
Lymphs Abs: 0.4 10*3/uL — ABNORMAL LOW (ref 0.7–4.0)
MCH: 26.7 pg (ref 26.0–34.0)
MCHC: 33.1 g/dL (ref 30.0–36.0)
MCV: 80.6 fL (ref 78.0–100.0)
Monocytes Absolute: 0.3 10*3/uL (ref 0.1–1.0)
Monocytes Relative: 9 %
NEUTROS PCT: 74 %
Neutro Abs: 2.4 10*3/uL (ref 1.7–7.7)
Platelets: 135 10*3/uL — ABNORMAL LOW (ref 150–400)
RBC: 3.6 MIL/uL — AB (ref 3.87–5.11)
RDW: 12.6 % (ref 11.5–15.5)
WBC: 3.2 10*3/uL — ABNORMAL LOW (ref 4.0–10.5)

## 2018-09-08 LAB — CULTURE, BLOOD (ROUTINE X 2)
CULTURE: NO GROWTH
Culture: NO GROWTH
Special Requests: ADEQUATE
Special Requests: ADEQUATE

## 2018-09-08 MED ORDER — OXYCODONE-ACETAMINOPHEN 5-325 MG PO TABS: 1.0000 | ORAL_TABLET | Freq: Four times a day (QID) | ORAL | 0 refills | Status: DC | PRN

## 2018-09-08 MED ORDER — FENTANYL 25 MCG/HR TD PT72: 25.0000 ug | MEDICATED_PATCH | TRANSDERMAL | 0 refills | Status: DC

## 2018-09-08 MED ORDER — HEPARIN SOD (PORK) LOCK FLUSH 100 UNIT/ML IV SOLN
500.0000 [IU] | INTRAVENOUS | Status: AC | PRN
  Administered 2018-09-08: 500 [IU]

## 2018-09-08 NOTE — Discharge Summary (Signed)
Physician Discharge Summary  KALEE BROXTON BZJ:[id number] DOB: [birth year] DOA: 09/03/2018  PCP: Patient, No Pcp Per  Admit date: 09/03/2018 Discharge date: 09/08/2018  Admitted From: Home Disposition:  Home  Discharge Condition:Stable CODE STATUS:FULL Diet recommendation: Heart Healthy  Brief/Interim Summary: Patient is a 67 y.o.femalewith a known history of hypertension, anemia bladder cancer with metastases to her lung and spine, recently diagnosed and started radiation and chemotherapy. She presented with fever and nausea with concern for infection. Source of infection remained unknown.  Her hospital course was remarkable for persistent nausea, vomiting and back pain.  Her back pain, nausea and vomiting have much improved.  Physical therapy evaluated her during the hospitalization and recommended skilled nursing facility on discharge. Currently she is hemodynamically stable for discharge to skilled nursing facility .  Following problems were addressed during her hospitalization:  SIRS No source of sepsis identified  at this time.She presented with fever and leucocytosis.She was started on broad spectrum antibiotics. Patient is immunocompromised secondary to chemotherapy. Blood cultures no growth to date. Urine culture no growth (final) She was on cefepime and Vancomycinand they have been discontinued Blood culture NGTD  Hyponatremia Thought secondary to hydrochlorothiazine. Significantly improved. Discontinue hydrochlorothiazide on discharge  Bladder cancer w/ metastasis to lung/bone Follows with Dr. Alen Blew as an outpatient. Her last chemotherapy was a week before admission here  .She has been undergoing radiation therapy.  Hypertension Continue her home medications.  Hyperlipidemia Continue Crestor  Anemia Continue iron supplementation  Nausea/vomiting Most likely secondary to chemotherapy that she received  a week ago.  Started on promethazine in addition to  Zofran.  Nausea and vomiting have improved.  Back pain Started on fentanyl patch.  Back pain has improved.  Debility/deconditioning: Patient eval by physical therapy today and recommended skilled nursing facility.      Discharge Diagnoses:  Principal Problem:   SIRS (systemic inflammatory response syndrome) (HCC) Active Problems:   HTN (hypertension)   Hyponatremia   Hyperlipidemia    Discharge Instructions  Discharge Instructions    Diet - low sodium heart healthy   Complete by:  As directed    Discharge instructions   Complete by:  As directed    1) Follow up with radiation therapies as scheduled. 2)Conitnue current medications. 3)Do CBC and BMP tests in a week.   Increase activity slowly   Complete by:  As directed      Allergies as of 09/08/2018      Reactions   Tramadol Nausea And Vomiting      Medication List    STOP taking these medications   hydrochlorothiazide 25 MG tablet Commonly known as:  HYDRODIURIL   traMADol 50 MG tablet Commonly known as:  ULTRAM     TAKE these medications   acetaminophen 325 MG tablet Commonly known as:  TYLENOL Take 650 mg by mouth every 6 (six) hours as needed for mild pain, moderate pain or headache.   amLODipine 10 MG tablet Commonly known as:  NORVASC Take 1 tablet (10 mg total) by mouth every morning.   fentaNYL 25 MCG/HR patch Commonly known as:  DURAGESIC - dosed mcg/hr Place 1 patch (25 mcg total) onto the skin every 3 (three) days. Start taking on:  09/09/2018   ferrous sulfate 325 (65 FE) MG tablet Start taking only after constipation is relieved. Take 1 tablet once daily for 1 week, then 1 tablet twice daily for 1 week and then 1 tablet three times daily. What changed:    how much to  take  how to take this  when to take this  additional instructions   lidocaine-prilocaine cream Commonly known as:  EMLA Apply 1 application topically as needed.   metoprolol succinate 100 MG 24 hr  tablet Commonly known as:  TOPROL-XL Take 100 mg by mouth daily.   montelukast 10 MG tablet Commonly known as:  SINGULAIR Take 10 mg by mouth daily.   olmesartan 40 MG tablet Commonly known as:  BENICAR Take 40 mg by mouth daily.   omeprazole 40 MG capsule Commonly known as:  PRILOSEC Take 40 mg by mouth daily.   ondansetron 8 MG disintegrating tablet Commonly known as:  ZOFRAN-ODT Take 1 tablet (8 mg total) by mouth every 8 (eight) hours as needed for nausea.   oxyCODONE-acetaminophen 5-325 MG tablet Commonly known as:  PERCOCET/ROXICET Take 1 tablet by mouth every 6 (six) hours as needed (for breakthrough pain).   polyethylene glycol packet Commonly known as:  MIRALAX / GLYCOLAX Take 17 g by mouth every other day.   prochlorperazine 10 MG tablet Commonly known as:  COMPAZINE Take 1 tablet (10 mg total) by mouth every 6 (six) hours as needed for nausea or vomiting.   rosuvastatin 5 MG tablet Commonly known as:  CRESTOR Take 5 mg by mouth daily.   senna-docusate 8.6-50 MG tablet Commonly known as:  Senokot-S Take 1 tablet by mouth daily as needed. What changed:  reasons to take this   STOOL SOFTENER PO Take 2 tablets by mouth daily as needed (constipation).       Allergies  Allergen Reactions  . Tramadol Nausea And Vomiting    Consultations: None  Procedures/Studies: Dg Chest 2 View  Result Date: 09/03/2018 CLINICAL DATA:  Nausea and weakness. Fever. Bladder cancer metastatic to the skeleton, lungs, and mediastinum. Recent initiation of radiation therapy 2 days ago. Chemotherapy starting 1 day ago. EXAM: CHEST - 2 VIEW COMPARISON:  Chest radiograph from 09/02/2018 FINDINGS: Power injectable right Port-A-Cath tip: SVC. Atherosclerotic calcification of the aortic arch. The patient is rotated to the right on today's radiograph, reducing diagnostic sensitivity and specificity. Bandlike density in the lingula likely from atelectasis or scarring, but this had a  hypermetabolic component on the PET-CT suggesting at least a part of this is due to a metastatic nodule. The right lung appears clear although there is a known hypermetabolic pulmonary nodule medially at the right lung base in the retro diaphragmatic region which is not well seen by conventional radiography. Patient has a known sternal metastatic lesion, not well seen on today's chest radiograph. There is some minimal blunting of the left posterior costophrenic angle and possibly the right posterior costophrenic angle. Mild enlargement of the cardiopericardial silhouette, without edema. The patient has known extensive thoracic spine metastatic disease which is poorly apparent on today's chest radiographs. IMPRESSION: 1. Stable mild cardiomegaly, without edema. 2. Mild lingular atelectasis with a known underlying hypermetabolic pulmonary nodule in this vicinity. 3. Other nodules at the right lung base and bony involvement in the sternum and thoracic spine are not readily apparent on conventional radiography. 4. Blunting of the posterior costophrenic angle suggesting small pleural effusions. 5.  Aortic Atherosclerosis (ICD10-I70.0). Electronically Signed   By: Van Clines M.D.   On: 09/03/2018 16:16   Ct Abdomen Pelvis W Contrast  Result Date: 09/03/2018 CLINICAL DATA:  History of bladder cancer. The patient is undergoing chemotherapy and radiation therapy. Nausea and vomiting. EXAM: CT ABDOMEN AND PELVIS WITH CONTRAST TECHNIQUE: Multidetector CT imaging of the abdomen and  pelvis was performed using the standard protocol following bolus administration of intravenous contrast. CONTRAST:  133mL ISOVUE-300 IOPAMIDOL (ISOVUE-300) INJECTION 61% COMPARISON:  Body CT 07/21/2018 FINDINGS: Lower chest: Right lower lobe pulmonary nodule measures 1.5 cm, stable. Subpleural nodularity in the left lower lobe, mainly 12 mm soft tissue nodule along the medial lower lobe pleura, and several soft tissue nodules along the  inferolateral left lower lobe pleura. Small left pleural effusion. Small hiatal hernia. Hepatobiliary: Subtle 5 mm area of hypoattenuation in the inferior aspect of the right lobe of the liver, image 28/85, sequence 2. No gallstones, gallbladder wall thickening, or biliary dilatation. Pancreas: Unremarkable. No pancreatic ductal dilatation or surrounding inflammatory changes. Spleen: Normal in size without focal abnormality. Adrenals/Urinary Tract: Normal appearance of the adrenal glands. Benign-appearing 4.2 cm left renal cyst. 6 mm hypoattenuated nodule in the superior cortex of the right kidney, too small to be actually characterize. Stable appearance of the bladder. Stomach/Bowel: Stomach is within normal limits. No evidence of appendicitis. No evidence of bowel wall thickening, distention, or inflammatory changes. Vascular/Lymphatic: Aortic atherosclerosis. No enlarged abdominal or pelvic lymph nodes. Reproductive: Uterus and bilateral adnexa are unremarkable. Other: No abdominal wall hernia or abnormality. No abdominopelvic ascites. Musculoskeletal: Interval increase in the soft tissue component of the lytic lesions of the L2 spinous process and left lateral process with extension to the lamina and pedicle. New lytic lesion within the inferior aspect of T12 vertebral body. Increase lytic lesion within the superior endplate of L1 vertebral body with interval development of compression deformity of the superior endplate. Enlarging lytic lesions within the anterior aspect of the L3 and L4 vertebral bodies. IMPRESSION: Worsening pulmonary metastatic disease. Mainly, interval development of suspicious subpleural nodules in the left lower lobe and lingula. Small left pleural effusion. Stable right lower lobe pulmonary nodule. Worsening skeletal metastatic disease. Interval increase in the soft tissue component of the lytic lesions of the L2 spinous process and left lateral process with extension to the lamina and  pedicle. New lytic lesion within the inferior aspect of T12 vertebral body. Increase lytic lesion within the superior endplate of L1 vertebral body with interval development of compression fracture of the superior endplate. Enlarging lytic lesions within the anterior aspect of the L3 and L4 vertebral bodies. Subtle 5 mm area of hypoattenuation in the inferior aspect of right lobe of the liver, too small to be actually characterize. Small hiatal hernia. Electronically Signed   By: Fidela Salisbury M.D.   On: 09/03/2018 16:58   Ct Biopsy  Result Date: 08/14/2018 CLINICAL DATA:  History of bladder carcinoma with imaging evidence of metastatic disease involving multiple sites of the skeleton, mediastinal lymph nodes and bilateral pulmonary nodules. The most accessible site for percutaneous biopsy is tumor causing destruction of the posterior elements of the thoracic spine. EXAM: CT GUIDED CORE BIOPSY OF THORACIC PARASPINAL TUMOR ANESTHESIA/SEDATION: 3.0 mg IV Versed; 200 mcg IV Fentanyl Total Moderate Sedation Time:  31 minutes. The patient's level of consciousness and physiologic status were continuously monitored during the procedure by Radiology nursing. PROCEDURE: The procedure risks, benefits, and alternatives were explained to the patient. Questions regarding the procedure were encouraged and answered. The patient understands and consents to the procedure. CT was performed in a prone position. The left thoracic paraspinal region was prepped with chlorhexidine in a sterile fashion, and a sterile drape was applied covering the operative field. A sterile gown and sterile gloves were used for the procedure. Local anesthesia was provided with 1% Lidocaine. Under  CT guidance, a 17 gauge trocar needle was advanced into the posterior paraspinal region of the thoracic spine to the left of midline at the T7 level. A single 18 gauge core biopsy sample was obtained. Gel-Foam pledgets were advanced through the outer  needle. The 17 gauge needle was reinserted and adjusted in position more laterally at the juncture of the medial left seventh rib and left T7 transverse process. Two additional 18 gauge core biopsy samples were obtained at this level. Additional CT was performed after outer needle removal. COMPLICATIONS: Focal hemorrhage in the left posterior paraspinous region. SIR level A: No therapy, no consequence. FINDINGS: Soft tissue in the posterior paraspinous region destroying posterior elements of the T7 vertebral body and extending into the medial aspect of the posterior left seventh rib was targeted. After initial biopsy in the posterior paraspinous region, there was bleeding noted from the outer needle. CT demonstrates focal hemorrhage in the paraspinous musculature which was treated with advancement of Gel-Foam pledgets. This hemorrhage was stable throughout the procedure and did not appear to expand. The outer needle was redirected to a slightly more lateral position after the first biopsy sample was obtained in order to decrease risk of further hemorrhage. IMPRESSION: CT-guided core biopsy performed of paraspinous soft tissue destroying the posterior elements of T7 and invading the medial left seventh rib. Focal posterior paraspinous hemorrhage did occur after the first core biopsy sample was obtained. This was treated with application of Gel-Foam pledgets and the focal hemorrhage did not appear to worsen during the rest of the procedure. Electronically Signed   By: Aletta Edouard M.D.   On: 08/14/2018 14:27   Dg Chest Port 1 View  Result Date: 09/02/2018 CLINICAL DATA:  Hypotension. EXAM: PORTABLE CHEST 1 VIEW COMPARISON:  07/17/2018 and prior radiographs.  07/24/2018 CT. FINDINGS: The cardiomediastinal silhouette is unchanged. A RIGHT IJ Port-A-Cath is noted with tip overlying the mid SVC. RIGHT basilar opacity/atelectasis/scarring again noted. No new pulmonary opacities are identified. No definite pleural  effusion or pneumothorax. IMPRESSION: No acute abnormality. Electronically Signed   By: Margarette Canada M.D.   On: 09/02/2018 23:48   Ir Imaging Guided Port Insertion  Result Date: 09/01/2018 CLINICAL DATA:  Urothelial carcinoma of bladder, needs durable venous access for chemotherapy regimen EXAM: TUNNELED PORT CATHETER PLACEMENT WITH ULTRASOUND AND FLUOROSCOPIC GUIDANCE FLUOROSCOPY TIME:  0.1 minute; 21 uGym2 DAP ANESTHESIA/SEDATION: Intravenous Fentanyl and Versed were administered as conscious sedation during continuous monitoring of the patient's level of consciousness and physiological / cardiorespiratory status by the radiology RN, with a total moderate sedation time of 13 minutes. TECHNIQUE: The procedure, risks, benefits, and alternatives were explained to the patient. Questions regarding the procedure were encouraged and answered. The patient understands and consents to the procedure. As antibiotic prophylaxis, cefazolin 2 g was ordered pre-procedure and administered intravenously within one hour of incision. Patency of the right IJ vein was confirmed with ultrasound with image documentation. An appropriate skin site was determined. Skin site was marked. Region was prepped using maximum barrier technique including cap and mask, sterile gown, sterile gloves, large sterile sheet, and Chlorhexidine as cutaneous antisepsis. The region was infiltrated locally with 1% lidocaine. Under real-time ultrasound guidance, the right IJ vein was accessed with a 21 gauge micropuncture needle; the needle tip within the vein was confirmed with ultrasound image documentation. Needle was exchanged over a 018 guidewire for transitional dilator which allowed passage of the [facility] wire into the IVC. Over this, the transitional dilator was exchanged for a  5 [place] MPA catheter. A small incision was made on the right anterior chest wall and a subcutaneous pocket fashioned. The power-injectable port was positioned and its catheter  tunneled to the right IJ dermatotomy site. The MPA catheter was exchanged over an Amplatz wire for a peel-away sheath, through which the port catheter, which had been trimmed to the appropriate length, was advanced and positioned under fluoroscopy with its tip at the cavoatrial junction. Spot chest radiograph confirms good catheter position and no pneumothorax. The pocket was closed with deep interrupted and subcuticular continuous 3-0 Monocryl sutures. The port was flushed per protocol. The incisions were covered with Dermabond then covered with a sterile dressing. COMPLICATIONS: COMPLICATIONS None immediate IMPRESSION: Technically successful right IJ power-injectable port catheter placement. Ready for routine use. Electronically Signed   By: Lucrezia Europe M.D.   On: 09/01/2018 08:08       Subjective: Patient seen and examined the bedside this morning.  Remains comfortable.  Hemodynamically stable.  Nausea, vomiting and back pain have improved.  Stable for discharge  Discharge Exam: Vitals:   09/07/18 2130 09/08/18 0421  BP: 126/80 (!) 141/79  Pulse: 74 71  Resp: 18 18  Temp: 98.9 F (37.2 C) 98.9 F (37.2 C)  SpO2: 98% 94%   Vitals:   09/07/18 0531 09/07/18 1416 09/07/18 2130 09/08/18 0421  BP: 132/73 130/77 126/80 (!) 141/79  Pulse: 72 76 74 71  Resp: 18 18 18 18   Temp: 98.6 F (37 C) 98.6 F (37 C) 98.9 F (37.2 C) 98.9 F (37.2 C)  TempSrc: Oral Oral Oral Oral  SpO2: 94% 95% 98% 94%  Weight:      Height:        General: Pt is alert, awake, not in acute distress Cardiovascular: RRR, S1/S2 +, no rubs, no gallops Respiratory: CTA bilaterally, no wheezing, no rhonchi Abdominal: Soft, NT, ND, bowel sounds + Extremities: no edema, no cyanosis    The results of significant diagnostics from this hospitalization (including imaging, microbiology, ancillary and laboratory) are listed below for reference.     Microbiology: Recent Results (from the past 240 hour(s))  Blood  culture (routine x 2)     Status: None   Collection Time: 09/03/18  4:40 PM  Result Value Ref Range Status   Specimen Description   Final    BLOOD RIGHT ARM Performed at [facility] [address] [place], [place] [address]    Special Requests   Final    BOTTLES DRAWN AEROBIC AND ANAEROBIC Blood Culture adequate volume Performed at [facility] [address], [place], [place] [address]    Culture   Final    NO GROWTH 5 DAYS Performed at [facility], [place] [address], [place], [place] [address]    Report Status 09/08/2018 FINAL  Final  Blood culture (routine x 2)     Status: None   Collection Time: 09/03/18  4:45 PM  Result Value Ref Range Status   Specimen Description   Final    BLOOD LEFT ARM Performed at [facility] [address], [place], [place] [address]    Special Requests   Final    BOTTLES DRAWN AEROBIC AND ANAEROBIC Blood Culture adequate volume Performed at [facility] [address], [place], [place] [address]    Culture   Final    NO GROWTH 5 DAYS Performed at [facility], [place] [address], [place], [place] [address]  Report Status 09/08/2018 FINAL  Final  Urine culture     Status: None   Collection Time: 09/03/18  5:54 PM  Result Value Ref Range Status   Specimen Description   Final    URINE, CLEAN CATCH Performed at [facility], [place] [address], [place], [place] [address]    Special Requests   Final    NONE Performed at [facility], [place] [address] [place], [place] [address]    Culture   Final    NO GROWTH Performed at [facility], [place] [address] [place], [place] [address]    Report Status 09/05/2018 FINAL  Final     Labs: BNP (last 3 results) No results for input(s): BNP in the last 8760 hours. Basic Metabolic Panel: Recent Labs  Lab 09/02/18 2303 09/02/18 2322 09/03/18 1431 09/03/18 2030  09/04/18 0432 09/07/18 0411 09/08/18 0415  NA 132*  --  126*  --  132* 134* 134*  K 4.0  --  3.6  --  4.3 3.2* 3.7  CL 92*  --  88*  --  93* 97* 98  CO2 27  --  28  --  29 27 28   GLUCOSE 207*  --  169*  --  115* 101* 106*  BUN 27*  --  23  --  19 14 11   CREATININE 1.17*  --  1.12*  --  1.00 0.77 0.76  CALCIUM 10.6*  --  9.9  --  9.7 9.0 8.7*  MG  --  2.0  --  2.0  --   --   --   PHOS  --   --   --  3.1  --   --   --    Liver Function Tests: Recent Labs  Lab 09/02/18 2303 09/03/18 1431 09/04/18 0432  AST 22 22 16   ALT 16 14 11   ALKPHOS 90 82 68  BILITOT 0.9 1.0 0.8  PROT 8.2* 7.7 7.2  ALBUMIN 3.7 3.3* 2.9*   Recent Labs  Lab 09/03/18 1431  LIPASE 39   No results for input(s): AMMONIA in the last 168 hours. CBC: Recent Labs  Lab 09/02/18 2303 09/03/18 1431 09/04/18 0432 09/07/18 0411 09/08/18 0415  WBC 11.1* 13.8* 11.3* 3.0* 3.2*  NEUTROABS 9.9* 13.3*  --  2.3 2.4  HGB 13.3 12.9 11.5* 10.1* 9.6*  HCT 39.5 37.8 34.9* 29.9* 29.0*  MCV 81.4 78.9 80.2 79.7 80.6  PLT 311 284 233 155 135*   Cardiac Enzymes: No results for input(s): CKTOTAL, CKMB, CKMBINDEX, TROPONINI in the last 168 hours. BNP: Invalid input(s): POCBNP CBG: Recent Labs  Lab 09/06/18 1212 09/06/18 1615 09/06/18 2058 09/07/18 1140 09/07/18 1659  GLUCAP 106* 130* 108* 115* 130*   D-Dimer No results for input(s): DDIMER in the last 72 hours. Hgb A1c No results for input(s): HGBA1C in the last 72 hours. Lipid Profile No results for input(s): CHOL, HDL, LDLCALC, TRIG, CHOLHDL, LDLDIRECT in the last 72 hours. Thyroid function studies No results for input(s): TSH, T4TOTAL, T3FREE, THYROIDAB in the last 72 hours.  Invalid input(s): FREET3 Anemia work up No results for input(s): VITAMINB12, FOLATE, FERRITIN, TIBC, IRON, RETICCTPCT in the last 72 hours. Urinalysis    Component Value Date/Time   COLORURINE STRAW (A) 09/03/2018 1754   APPEARANCEUR CLEAR 09/03/2018 1754   LABSPEC 1.021  09/03/2018 1754   PHURINE 7.0 09/03/2018 1754   GLUCOSEU NEGATIVE 09/03/2018 1754   HGBUR SMALL (A) 09/03/2018 1754   BILIRUBINUR NEGATIVE 09/03/2018  [place] 09/03/2018 [place] 09/03/2018 1754   NITRITE NEGATIVE 09/03/2018 1754   LEUKOCYTESUR TRACE (A) 09/03/2018 1754   Sepsis Labs Invalid input(s): PROCALCITONIN,  WBC,  LACTICIDVEN Microbiology Recent Results (from the past 240 hour(s))  Blood culture (routine x 2)     Status: None   Collection Time: 09/03/18  4:40 PM  Result Value Ref Range Status   Specimen Description   Final    BLOOD RIGHT ARM Performed at [facility], [place] [address], [place], [place] [address]    Special Requests   Final    BOTTLES DRAWN AEROBIC AND ANAEROBIC Blood Culture adequate volume Performed at [facility] [address] [place], [place] [address]    Culture   Final    NO GROWTH 5 DAYS Performed at [facility], [place] [address] [place], [place] [address]    Report Status 09/08/2018 FINAL  Final  Blood culture (routine x 2)     Status: None   Collection Time: 09/03/18  4:45 PM  Result Value Ref Range Status   Specimen Description   Final    BLOOD LEFT ARM Performed at [facility] [address], [place], [place] [address]    Special Requests   Final    BOTTLES DRAWN AEROBIC AND ANAEROBIC Blood Culture adequate volume Performed at [facility] [address] [place], [place] [address]    Culture   Final    NO GROWTH 5 DAYS Performed at [facility], [place] [address] [place], [place] [address]    Report Status 09/08/2018 FINAL  Final  Urine culture     Status: None   Collection Time: 09/03/18  5:54 PM  Result Value Ref Range Status   Specimen Description   Final    URINE, CLEAN CATCH Performed at [facility], [place] [address] [place], [place] [address]    Special Requests   Final     NONE Performed at [facility], [place] [address] [place], [place] [address]    Culture   Final    NO GROWTH Performed at [facility], [place] [address] [place],  [address]    Report Status 09/05/2018 FINAL  Final    Please note: You were cared for by a hospitalist during your hospital stay. Once you are discharged, your primary care physician will handle any further medical issues. Please note that NO REFILLS for any discharge medications will be authorized once you are discharged, as it is imperative that you return to your primary care physician (or establish a relationship with a primary care physician if you do not have one) for your post hospital discharge needs so that they can reassess your need for medications and monitor your lab values.    Time coordinating discharge: 40 minutes  SIGNED:   Shelly Coss, MD  [facility] 09/08/2018, 11:42 AM Pager [phone]  If 7PM-7AM, please contact night-coverage [email or web address] Password [email or web address]

## 2018-09-08 NOTE — Progress Notes (Signed)
Attempted to call report at [phone] 3 times and was unsuccessful.

## 2018-09-08 NOTE — Clinical Social Work Placement (Signed)
Patient received and accepted bed offer at [facility]. Facility aware of discharge and confirmed bed offer. [facility] contacted, patient's family notified. Patient's RN can call report to [phone] room 3235, packet complete. CSW signing off, no other needs identified at this time.  CLINICAL SOCIAL WORK PLACEMENT  NOTE  Date:  09/08/2018  Patient Details  Name: Faith [name] MRN: [id number] Date of Birth: June 15, 1951  Clinical Social Work is seeking post-discharge placement for this patient at the [place] level of care (*CSW will initial, date and re-position this form in  chart as items are completed):  Yes   Patient/family provided with [place] Work Department's list of facilities offering this level of care within the geographic area requested by the patient (or if unable, by the patient's family).  Yes   Patient/family informed of their freedom to choose among providers that offer the needed level of care, that participate in Medicare, Medicaid or managed care program needed by the patient, have an available bed and are willing to accept the patient.  Yes   Patient/family informed of [place]'s ownership interest in [facility] and [facility], as well as of the fact that they are under no obligation to receive care at these facilities.  PASRR submitted to EDS on       PASRR number received on 09/07/18     Existing PASRR number confirmed on       FL2 transmitted to all facilities in geographic area requested by pt/family on 09/07/18     FL2 transmitted to all facilities within larger geographic area on       Patient informed that his/her managed care company has contracts with or will negotiate with certain facilities, including the following:        Yes   Patient/family informed of bed offers received.  Patient chooses bed at [facility]     Physician recommends and patient chooses bed at      Patient to be  transferred to [facility] on 09/08/18.  Patient to be transferred to facility by [facility]     Patient family notified on 09/08/18 of transfer.  Name of family member notified:  Lisette Grinder     PHYSICIAN       Additional Comment:    _______________________________________________ Burnis Medin, LCSW 09/08/2018, 4:47 PM

## 2018-09-11 ENCOUNTER — Ambulatory Visit
Admission: RE | Admit: 2018-09-11 | Discharge: 2018-09-11 | Disposition: A | Discharge status: Home / Self Care | Payer: Medicare Other | Source: Ambulatory Visit | Attending: Radiation Oncology | Admitting: Radiation Oncology

## 2018-09-11 DIAGNOSIS — C679 Malignant neoplasm of bladder, unspecified: Secondary | ICD-10-CM | POA: Diagnosis not present

## 2018-09-11 DIAGNOSIS — I10 Essential (primary) hypertension: Secondary | ICD-10-CM | POA: Diagnosis not present

## 2018-09-11 DIAGNOSIS — C7951 Secondary malignant neoplasm of bone: Secondary | ICD-10-CM | POA: Diagnosis not present

## 2018-09-11 DIAGNOSIS — R6510 Systemic inflammatory response syndrome (SIRS) of non-infectious origin without acute organ dysfunction: Secondary | ICD-10-CM | POA: Diagnosis not present

## 2018-09-11 DIAGNOSIS — Z510 Encounter for antineoplastic radiation therapy: Secondary | ICD-10-CM | POA: Diagnosis not present

## 2018-09-12 ENCOUNTER — Ambulatory Visit
Admission: RE | Admit: 2018-09-12 | Discharge: 2018-09-12 | Disposition: A | Discharge status: Home / Self Care | Payer: Medicare Other | Source: Ambulatory Visit | Attending: Radiation Oncology | Admitting: Radiation Oncology

## 2018-09-12 DIAGNOSIS — R270 Ataxia, unspecified: Secondary | ICD-10-CM | POA: Diagnosis not present

## 2018-09-12 DIAGNOSIS — C7951 Secondary malignant neoplasm of bone: Secondary | ICD-10-CM | POA: Diagnosis not present

## 2018-09-13 ENCOUNTER — Ambulatory Visit
Admission: RE | Admit: 2018-09-13 | Discharge: 2018-09-13 | Disposition: A | Discharge status: Home / Self Care | Payer: Medicare Other | Source: Ambulatory Visit | Attending: Radiation Oncology | Admitting: Radiation Oncology

## 2018-09-13 DIAGNOSIS — C7951 Secondary malignant neoplasm of bone: Secondary | ICD-10-CM | POA: Diagnosis not present

## 2018-09-14 ENCOUNTER — Ambulatory Visit
Admission: RE | Admit: 2018-09-14 | Discharge: 2018-09-14 | Disposition: A | Discharge status: Home / Self Care | Payer: Medicare Other | Source: Ambulatory Visit | Attending: Radiation Oncology | Admitting: Radiation Oncology

## 2018-09-14 DIAGNOSIS — C7951 Secondary malignant neoplasm of bone: Secondary | ICD-10-CM | POA: Diagnosis not present

## 2018-09-15 ENCOUNTER — Ambulatory Visit
Admission: RE | Admit: 2018-09-15 | Discharge: 2018-09-15 | Disposition: A | Discharge status: Home / Self Care | Payer: Medicare Other | Source: Ambulatory Visit | Attending: Radiation Oncology | Admitting: Radiation Oncology

## 2018-09-15 DIAGNOSIS — C7951 Secondary malignant neoplasm of bone: Secondary | ICD-10-CM | POA: Diagnosis not present

## 2018-09-18 ENCOUNTER — Ambulatory Visit
Admission: RE | Admit: 2018-09-18 | Discharge: 2018-09-18 | Disposition: A | Discharge status: Home / Self Care | Payer: Medicare Other | Source: Ambulatory Visit | Attending: Radiation Oncology | Admitting: Radiation Oncology

## 2018-09-18 DIAGNOSIS — C679 Malignant neoplasm of bladder, unspecified: Secondary | ICD-10-CM | POA: Diagnosis not present

## 2018-09-18 DIAGNOSIS — I10 Essential (primary) hypertension: Secondary | ICD-10-CM | POA: Diagnosis not present

## 2018-09-18 DIAGNOSIS — M545 Low back pain: Secondary | ICD-10-CM | POA: Diagnosis not present

## 2018-09-18 DIAGNOSIS — C7951 Secondary malignant neoplasm of bone: Secondary | ICD-10-CM | POA: Diagnosis not present

## 2018-09-19 ENCOUNTER — Ambulatory Visit
Admission: RE | Admit: 2018-09-19 | Discharge: 2018-09-19 | Disposition: A | Discharge status: Home / Self Care | Payer: Medicare Other | Source: Ambulatory Visit | Attending: Radiation Oncology | Admitting: Radiation Oncology

## 2018-09-19 DIAGNOSIS — C7951 Secondary malignant neoplasm of bone: Secondary | ICD-10-CM | POA: Diagnosis not present

## 2018-09-20 ENCOUNTER — Other Ambulatory Visit: Payer: Self-pay | Admitting: Medical

## 2018-09-20 ENCOUNTER — Telehealth: Payer: Self-pay | Admitting: *Deleted

## 2018-09-20 ENCOUNTER — Inpatient Hospital Stay: Payer: Medicare Other

## 2018-09-20 ENCOUNTER — Ambulatory Visit
Admission: RE | Admit: 2018-09-20 | Discharge: 2018-09-20 | Disposition: A | Discharge status: Home / Self Care | Payer: Medicare Other | Source: Ambulatory Visit | Attending: Radiation Oncology | Admitting: Radiation Oncology

## 2018-09-20 ENCOUNTER — Other Ambulatory Visit: Payer: Self-pay | Admitting: Radiation Oncology

## 2018-09-20 VITALS — BP 142/83 | HR 97 | Temp 98.2°F | Resp 24 | Ht 62.0 in

## 2018-09-20 DIAGNOSIS — R110 Nausea: Secondary | ICD-10-CM | POA: Diagnosis not present

## 2018-09-20 DIAGNOSIS — R112 Nausea with vomiting, unspecified: Secondary | ICD-10-CM

## 2018-09-20 DIAGNOSIS — C679 Malignant neoplasm of bladder, unspecified: Secondary | ICD-10-CM | POA: Diagnosis present

## 2018-09-20 DIAGNOSIS — Z923 Personal history of irradiation: Secondary | ICD-10-CM | POA: Diagnosis not present

## 2018-09-20 DIAGNOSIS — C7800 Secondary malignant neoplasm of unspecified lung: Secondary | ICD-10-CM | POA: Diagnosis not present

## 2018-09-20 DIAGNOSIS — Z79899 Other long term (current) drug therapy: Secondary | ICD-10-CM | POA: Diagnosis not present

## 2018-09-20 DIAGNOSIS — R531 Weakness: Secondary | ICD-10-CM | POA: Diagnosis not present

## 2018-09-20 DIAGNOSIS — C7951 Secondary malignant neoplasm of bone: Secondary | ICD-10-CM | POA: Diagnosis not present

## 2018-09-20 DIAGNOSIS — E860 Dehydration: Secondary | ICD-10-CM

## 2018-09-20 DIAGNOSIS — D6489 Other specified anemias: Secondary | ICD-10-CM

## 2018-09-20 DIAGNOSIS — C781 Secondary malignant neoplasm of mediastinum: Secondary | ICD-10-CM | POA: Diagnosis not present

## 2018-09-20 DIAGNOSIS — Z5111 Encounter for antineoplastic chemotherapy: Secondary | ICD-10-CM | POA: Diagnosis not present

## 2018-09-20 MED ORDER — ONDANSETRON HCL 4 MG/2ML IJ SOLN
8.0000 mg | Freq: Once | INTRAMUSCULAR | Status: AC
  Administered 2018-09-20: 8 mg via INTRAVENOUS

## 2018-09-20 MED ORDER — LORAZEPAM 1 MG PO TABS
0.5000 mg | ORAL_TABLET | Freq: Once | ORAL | Status: AC
  Administered 2018-09-20: 0.5 mg via SUBLINGUAL

## 2018-09-20 MED ORDER — SODIUM CHLORIDE 0.9 % IJ SOLN
10.0000 mL | Freq: Once | INTRAMUSCULAR | Status: AC
  Administered 2018-09-20: 10 mL via INTRAVENOUS
  Filled 2018-09-20: qty 10

## 2018-09-20 MED ORDER — ONDANSETRON HCL 4 MG/2ML IJ SOLN
INTRAMUSCULAR | Status: AC
  Filled 2018-09-20: qty 4

## 2018-09-20 MED ORDER — LORAZEPAM 0.5 MG PO TABS: 0.5000 mg | ORAL_TABLET | ORAL | 0 refills | Status: DC | PRN

## 2018-09-20 MED ORDER — SODIUM CHLORIDE 0.9 % IV SOLN
Freq: Once | INTRAVENOUS | Status: AC
  Administered 2018-09-20: 16:00:00 via INTRAVENOUS
  Filled 2018-09-20: qty 250

## 2018-09-20 MED ORDER — HEPARIN SOD (PORK) LOCK FLUSH 100 UNIT/ML IV SOLN
500.0000 [IU] | Freq: Once | INTRAVENOUS | Status: AC
  Administered 2018-09-20: 500 [IU] via INTRAVENOUS
  Filled 2018-09-20: qty 5

## 2018-09-20 MED ORDER — LORAZEPAM 1 MG PO TABS
ORAL_TABLET | ORAL | Status: AC
  Filled 2018-09-20: qty 1

## 2018-09-20 NOTE — Progress Notes (Signed)
Spoke with Faith [name] at [facility] to let her know that Faith [name] would be returning to them later than expected.  I let her know that we would be sending her for IV fluids, nausea medication, and she would return to them with a prescription for nausea medicine.  Will continue to follow as necessary.  [name]. Leonie Green, BSN

## 2018-09-20 NOTE — Progress Notes (Signed)
The patient was seen today briefly following completion of her radiotherapy to her thoracic and lumbar spine.  She is being treated for metastatic urothelial cancer arising in the bladder.  She is currently in the process of receiving cisplatin and Gemzar, and received only 1 infusion of this on 08/31/2018.  She received her palliative radiotherapy over 3 weeks, and was admitted to the hospital about a week and a half ago, she was felt to have Sirs, though no source of sepsis was identified, she had been febrile with leukocytosis and started on broad-spectrum antibiotics.  Urine and blood cultures had no growth, and she had also been having nauseousness which was attributed at the time to her chemotherapy.  She has been able to tolerate her radiation however, today I was notified that she has had persistent uncontrolled nauseousness and episodes of emesis.  She describes these occurring at any given time of the day without warning, and states that she has not had any headaches or visual disturbances.  She denies any sharp shooting abdominal pain or waves of abdominal pain that come on prior to these episodes, and it is been about 4 days since she was last able to keep in any significant amount of food or drink.  She states that her urine is concentrated.  She denies any known fevers.  She has been using Zofran and Compazine without improvement in her symptoms, she denies any dysuria or flank pain.  Physical exam Wt Readings from Last 3 Encounters:  09/03/18 171 lb 4.8 oz (77.7 kg)  09/02/18 167 lb (75.8 kg)  08/31/18 168 lb (76.2 kg)   Temp Readings from Last 3 Encounters:  09/20/18 98.2 F (36.8 C) (Oral)  09/08/18 98.9 F (37.2 C)  09/02/18 98.8 F (37.1 C) (Oral)   BP Readings from Last 3 Encounters:  09/20/18 (!) 142/83  09/08/18 (!) 164/80  09/03/18 (!) 155/66   Pulse Readings from Last 3 Encounters:  09/20/18 97  09/08/18 84  09/03/18 73   In general this is a chronically ill-appearing  African-American female in no acute distress. She's alert and oriented x4 and appropriate throughout the examination. Cardiopulmonary assessment is negative for acute distress and she exhibits normal effort however she has a tachycardic rate, though normal rhythm, no clicks rubs or murmurs are auscultated.  Chest is clear to auscultation bilaterally.  No CVA tenderness is noted.  Her abdomen is notable for bowel sounds in all quadrants is soft, nontender nondistended.  Impression/Plan: 1. Metastatic urothelial carcinoma to the thoracic and lumbar spine with intractable nausea.  The patient has tolerated her chemotherapy, as well as her radiation well, I am concerned that she is still having symptoms of intractable nausea.  While this could be related to her radiotherapy given the fact that she does have some exposure to the small intestine with the thoracic component of her treatment, her symptoms are unpredictable, and persistent.  While she does not have any neurologic symptoms or complaints, I will also reach out to Dr. Alen Blew to make sure that he does not feel that there is a need for imaging of her brain.  We discussed the use of Ativan as well as continuation of Zofran and Compazine to see if this would help at all.  I have also spoken with symptom management colleagues Sandi Mealy, PA-C who will see the patient to administer a liter of fluid, we will follow-up with her sister who is able to be in close contact with the patient while  she remains at the facility, to determine how she is feeling later in the week.  If her symptoms progress, she was counseled on the rationale to be seen in the emergency room.     Carola Rhine, PAC

## 2018-09-20 NOTE — Patient Instructions (Addendum)
Dehydration, Adult Dehydration is when there is not enough fluid or water in your body. This happens when you lose more fluids than you take in. Dehydration can range from mild to very bad. It should be treated right away to keep it from getting very bad. Symptoms of mild dehydration may include:  Thirst.  Dry lips.  Slightly dry mouth.  Dry, warm skin.  Dizziness. Symptoms of moderate dehydration may include:  Very dry mouth.  Muscle cramps.  Dark pee (urine). Pee may be the color of tea.  Your body making less pee.  Your eyes making fewer tears.  Heartbeat that is uneven or faster than normal (palpitations).  Headache.  Light-headedness, especially when you stand up from sitting.  Fainting (syncope). Symptoms of very bad dehydration may include:  Changes in skin, such as: ? Cold and clammy skin. ? Blotchy (mottled) or pale skin. ? Skin that does not quickly return to normal after being lightly pinched and let go (poor skin turgor).  Changes in body fluids, such as: ? Feeling very thirsty. ? Your eyes making fewer tears. ? Not sweating when body temperature is high, such as in hot weather. ? Your body making very little pee.  Changes in vital signs, such as: ? Weak pulse. ? Pulse that is more than 100 beats a minute when you are sitting still. ? Fast breathing. ? Low blood pressure.  Other changes, such as: ? Sunken eyes. ? Cold hands and feet. ? Confusion. ? Lack of energy (lethargy). ? Trouble waking up from sleep. ? Short-term weight loss. ? Unconsciousness. Follow these instructions at home:  If told by your doctor, drink an ORS: ? Make an ORS by using instructions on the package. ? Start by drinking small amounts, about  cup (120 mL) every 5-10 minutes. ? Slowly drink more until you have had the amount that your doctor said to have.  Drink enough clear fluid to keep your pee clear or pale yellow. If you were told to drink an ORS, finish the ORS  first, then start slowly drinking clear fluids. Drink fluids such as: ? Water. Do not drink only water by itself. Doing that can make the salt (sodium) level in your body get too low (hyponatremia). ? Ice chips. ? Fruit juice that you have added water to (diluted). ? Low-calorie sports drinks.  Avoid: ? Alcohol. ? Drinks that have a lot of sugar. These include high-calorie sports drinks, fruit juice that does not have water added, and soda. ? Caffeine. ? Foods that are greasy or have a lot of fat or sugar.  Take over-the-counter and prescription medicines only as told by your doctor.  Do not take salt tablets. Doing that can make the salt level in your body get too high (hypernatremia).  Eat foods that have minerals (electrolytes). Examples include bananas, oranges, potatoes, tomatoes, and spinach.  Keep all follow-up visits as told by your doctor. This is important. Contact a doctor if:  You have belly (abdominal) pain that: ? Gets worse. ? Stays in one area (localizes).  You have a rash.  You have a stiff neck.  You get angry or annoyed more easily than normal (irritability).  You are more sleepy than normal.  You have a harder time waking up than normal.  You feel: ? Weak. ? Dizzy. ? Very thirsty.  You have peed (urinated) only a small amount of very dark pee during 6-8 hours. Get help right away if:  You have symptoms of   very bad dehydration.  You cannot drink fluids without throwing up (vomiting).  Your symptoms get worse with treatment.  You have a fever.  You have a very bad headache.  You are throwing up or having watery poop (diarrhea) and it: ? Gets worse. ? Does not go away.  You have blood or something green (bile) in your throw-up.  You have blood in your poop (stool). This may cause poop to look black and tarry.  You have not peed in 6-8 hours.  You pass out (faint).  Your heart rate when you are sitting still is more than 100 beats a  minute.  You have trouble breathing. This information is not intended to replace advice given to you by your health care provider. Make sure you discuss any questions you have with your health care provider. Document Released: 10/09/2009 Document Revised: 07/02/2016 Document Reviewed: 02/06/2016 Elsevier Interactive Patient Education  2018 Elsevier Inc.  Dehydration, Adult Dehydration is when there is not enough fluid or water in your body. This happens when you lose more fluids than you take in. Dehydration can range from mild to very bad. It should be treated right away to keep it from getting very bad. Symptoms of mild dehydration may include:  Thirst.  Dry lips.  Slightly dry mouth.  Dry, warm skin.  Dizziness. Symptoms of moderate dehydration may include:  Very dry mouth.  Muscle cramps.  Dark pee (urine). Pee may be the color of tea.  Your body making less pee.  Your eyes making fewer tears.  Heartbeat that is uneven or faster than normal (palpitations).  Headache.  Light-headedness, especially when you stand up from sitting.  Fainting (syncope). Symptoms of very bad dehydration may include:  Changes in skin, such as: ? Cold and clammy skin. ? Blotchy (mottled) or pale skin. ? Skin that does not quickly return to normal after being lightly pinched and let go (poor skin turgor).  Changes in body fluids, such as: ? Feeling very thirsty. ? Your eyes making fewer tears. ? Not sweating when body temperature is high, such as in hot weather. ? Your body making very little pee.  Changes in vital signs, such as: ? Weak pulse. ? Pulse that is more than 100 beats a minute when you are sitting still. ? Fast breathing. ? Low blood pressure.  Other changes, such as: ? Sunken eyes. ? Cold hands and feet. ? Confusion. ? Lack of energy (lethargy). ? Trouble waking up from sleep. ? Short-term weight loss. ? Unconsciousness. Follow these instructions at  home:  If told by your doctor, drink an ORS: ? Make an ORS by using instructions on the package. ? Start by drinking small amounts, about  cup (120 mL) every 5-10 minutes. ? Slowly drink more until you have had the amount that your doctor said to have.  Drink enough clear fluid to keep your pee clear or pale yellow. If you were told to drink an ORS, finish the ORS first, then start slowly drinking clear fluids. Drink fluids such as: ? Water. Do not drink only water by itself. Doing that can make the salt (sodium) level in your body get too low (hyponatremia). ? Ice chips. ? Fruit juice that you have added water to (diluted). ? Low-calorie sports drinks.  Avoid: ? Alcohol. ? Drinks that have a lot of sugar. These include high-calorie sports drinks, fruit juice that does not have water added, and soda. ? Caffeine. ? Foods that are greasy or have   a lot of fat or sugar.  Take over-the-counter and prescription medicines only as told by your doctor.  Do not take salt tablets. Doing that can make the salt level in your body get too high (hypernatremia).  Eat foods that have minerals (electrolytes). Examples include bananas, oranges, potatoes, tomatoes, and spinach.  Keep all follow-up visits as told by your doctor. This is important. Contact a doctor if:  You have belly (abdominal) pain that: ? Gets worse. ? Stays in one area (localizes).  You have a rash.  You have a stiff neck.  You get angry or annoyed more easily than normal (irritability).  You are more sleepy than normal.  You have a harder time waking up than normal.  You feel: ? Weak. ? Dizzy. ? Very thirsty.  You have peed (urinated) only a small amount of very dark pee during 6-8 hours. Get help right away if:  You have symptoms of very bad dehydration.  You cannot drink fluids without throwing up (vomiting).  Your symptoms get worse with treatment.  You have a fever.  You have a very bad headache.  You  are throwing up or having watery poop (diarrhea) and it: ? Gets worse. ? Does not go away.  You have blood or something green (bile) in your throw-up.  You have blood in your poop (stool). This may cause poop to look black and tarry.  You have not peed in 6-8 hours.  You pass out (faint).  Your heart rate when you are sitting still is more than 100 beats a minute.  You have trouble breathing. This information is not intended to replace advice given to you by your health care provider. Make sure you discuss any questions you have with your health care provider. Document Released: 10/09/2009 Document Revised: 07/02/2016 Document Reviewed: 02/06/2016 Elsevier Interactive Patient Education  2018 Elsevier Inc.  

## 2018-09-20 NOTE — Telephone Encounter (Signed)
Patient's sister karen calling to ask if patient has an appt?  Informed her that her appt was today at 3:00 pm with Sandi Mealy in [facility] for IV fluids. Santiago Glad states she knew nothing about this appt and her sister is in [facility] home. Encouraged her to keep her appt tomorrow for lab,flush and dr Alen Blew. States she will try to have her sister here.

## 2018-09-21 ENCOUNTER — Telehealth: Payer: Self-pay | Admitting: Oncology

## 2018-09-21 ENCOUNTER — Inpatient Hospital Stay: Payer: Medicare Other

## 2018-09-21 ENCOUNTER — Inpatient Hospital Stay (HOSPITAL_BASED_OUTPATIENT_CLINIC_OR_DEPARTMENT_OTHER): Payer: Medicare Other | Admitting: Oncology

## 2018-09-21 ENCOUNTER — Other Ambulatory Visit: Payer: Medicare Other

## 2018-09-21 VITALS — BP 157/84 | HR 97 | Resp 99 | Ht 62.0 in

## 2018-09-21 VITALS — BP 171/81 | HR 103 | Temp 99.0°F | Resp 18 | Ht 62.0 in

## 2018-09-21 DIAGNOSIS — Z923 Personal history of irradiation: Secondary | ICD-10-CM

## 2018-09-21 DIAGNOSIS — C7951 Secondary malignant neoplasm of bone: Secondary | ICD-10-CM

## 2018-09-21 DIAGNOSIS — C679 Malignant neoplasm of bladder, unspecified: Secondary | ICD-10-CM

## 2018-09-21 DIAGNOSIS — D6489 Other specified anemias: Secondary | ICD-10-CM

## 2018-09-21 DIAGNOSIS — R110 Nausea: Secondary | ICD-10-CM

## 2018-09-21 DIAGNOSIS — C7800 Secondary malignant neoplasm of unspecified lung: Secondary | ICD-10-CM | POA: Diagnosis not present

## 2018-09-21 DIAGNOSIS — Z95828 Presence of other vascular implants and grafts: Secondary | ICD-10-CM

## 2018-09-21 DIAGNOSIS — C781 Secondary malignant neoplasm of mediastinum: Secondary | ICD-10-CM

## 2018-09-21 DIAGNOSIS — R112 Nausea with vomiting, unspecified: Secondary | ICD-10-CM

## 2018-09-21 DIAGNOSIS — Z79899 Other long term (current) drug therapy: Secondary | ICD-10-CM

## 2018-09-21 DIAGNOSIS — R531 Weakness: Secondary | ICD-10-CM

## 2018-09-21 LAB — CBC WITH DIFFERENTIAL (CANCER CENTER ONLY)
Basophils Absolute: 0 10*3/uL (ref 0.0–0.1)
Basophils Relative: 1 %
Eosinophils Absolute: 0.2 10*3/uL (ref 0.0–0.5)
Eosinophils Relative: 6 %
HCT: 33.5 % — ABNORMAL LOW (ref 34.8–46.6)
HEMOGLOBIN: 10.9 g/dL — AB (ref 11.6–15.9)
LYMPHS ABS: 0.3 10*3/uL — AB (ref 0.9–3.3)
LYMPHS PCT: 10 %
MCH: 26.5 pg (ref 25.1–34.0)
MCHC: 32.5 g/dL (ref 31.5–36.0)
MCV: 81.5 fL (ref 79.5–101.0)
Monocytes Absolute: 0.8 10*3/uL (ref 0.1–0.9)
Monocytes Relative: 28 %
NEUTROS PCT: 55 %
Neutro Abs: 1.6 10*3/uL (ref 1.5–6.5)
Platelet Count: 400 10*3/uL (ref 145–400)
RBC: 4.11 MIL/uL (ref 3.70–5.45)
RDW: 13.1 % (ref 11.2–14.5)
WBC: 3 10*3/uL — AB (ref 3.9–10.3)

## 2018-09-21 LAB — CMP (CANCER CENTER ONLY)
ALT: 11 U/L (ref 0–44)
AST: 17 U/L (ref 15–41)
Albumin: 2.8 g/dL — ABNORMAL LOW (ref 3.5–5.0)
Alkaline Phosphatase: 76 U/L (ref 38–126)
Anion gap: 12 (ref 5–15)
BUN: 9 mg/dL (ref 8–23)
CHLORIDE: 101 mmol/L (ref 98–111)
CO2: 26 mmol/L (ref 22–32)
Calcium: 9.6 mg/dL (ref 8.9–10.3)
Creatinine: 0.68 mg/dL (ref 0.44–1.00)
Glucose, Bld: 90 mg/dL (ref 70–99)
POTASSIUM: 3.5 mmol/L (ref 3.5–5.1)
SODIUM: 139 mmol/L (ref 135–145)
Total Bilirubin: 0.3 mg/dL (ref 0.3–1.2)
Total Protein: 7.2 g/dL (ref 6.5–8.1)

## 2018-09-21 MED ORDER — LORAZEPAM 1 MG PO TABS
ORAL_TABLET | ORAL | Status: AC
  Filled 2018-09-21: qty 1

## 2018-09-21 MED ORDER — ONDANSETRON HCL 4 MG/2ML IJ SOLN
8.0000 mg | Freq: Once | INTRAMUSCULAR | Status: AC
  Administered 2018-09-21: 8 mg via INTRAVENOUS

## 2018-09-21 MED ORDER — ONDANSETRON HCL 4 MG/2ML IJ SOLN
INTRAMUSCULAR | Status: AC
  Filled 2018-09-21: qty 4

## 2018-09-21 MED ORDER — SODIUM CHLORIDE 0.9 % IV SOLN
Freq: Once | INTRAVENOUS | Status: AC
  Administered 2018-09-21: 11:00:00 via INTRAVENOUS
  Filled 2018-09-21: qty 250

## 2018-09-21 MED ORDER — SODIUM CHLORIDE 0.9 % IJ SOLN
10.0000 mL | Freq: Once | INTRAMUSCULAR | Status: AC
  Administered 2018-09-21: 10 mL via INTRAVENOUS
  Filled 2018-09-21: qty 10

## 2018-09-21 MED ORDER — LORAZEPAM 1 MG PO TABS
0.5000 mg | ORAL_TABLET | Freq: Once | ORAL | Status: AC
  Administered 2018-09-21: 0.5 mg via ORAL

## 2018-09-21 MED ORDER — SODIUM CHLORIDE 0.9% FLUSH
10.0000 mL | Freq: Once | INTRAVENOUS | Status: AC
  Administered 2018-09-21: 10 mL
  Filled 2018-09-21: qty 10

## 2018-09-21 MED ORDER — HEPARIN SOD (PORK) LOCK FLUSH 100 UNIT/ML IV SOLN
500.0000 [IU] | Freq: Once | INTRAVENOUS | Status: AC
  Administered 2018-09-21: 500 [IU] via INTRAVENOUS
  Filled 2018-09-21: qty 5

## 2018-09-21 NOTE — Patient Instructions (Signed)
Dehydration, Adult Dehydration is when there is not enough fluid or water in your body. This happens when you lose more fluids than you take in. Dehydration can range from mild to very bad. It should be treated right away to keep it from getting very bad. Symptoms of mild dehydration may include:  Thirst.  Dry lips.  Slightly dry mouth.  Dry, warm skin.  Dizziness. Symptoms of moderate dehydration may include:  Very dry mouth.  Muscle cramps.  Dark pee (urine). Pee may be the color of tea.  Your body making less pee.  Your eyes making fewer tears.  Heartbeat that is uneven or faster than normal (palpitations).  Headache.  Light-headedness, especially when you stand up from sitting.  Fainting (syncope). Symptoms of very bad dehydration may include:  Changes in skin, such as: ? Cold and clammy skin. ? Blotchy (mottled) or pale skin. ? Skin that does not quickly return to normal after being lightly pinched and let go (poor skin turgor).  Changes in body fluids, such as: ? Feeling very thirsty. ? Your eyes making fewer tears. ? Not sweating when body temperature is high, such as in hot weather. ? Your body making very little pee.  Changes in vital signs, such as: ? Weak pulse. ? Pulse that is more than 100 beats a minute when you are sitting still. ? Fast breathing. ? Low blood pressure.  Other changes, such as: ? Sunken eyes. ? Cold hands and feet. ? Confusion. ? Lack of energy (lethargy). ? Trouble waking up from sleep. ? Short-term weight loss. ? Unconsciousness. Follow these instructions at home:  If told by your doctor, drink an ORS: ? Make an ORS by using instructions on the package. ? Start by drinking small amounts, about  cup (120 mL) every 5-10 minutes. ? Slowly drink more until you have had the amount that your doctor said to have.  Drink enough clear fluid to keep your pee clear or pale yellow. If you were told to drink an ORS, finish the ORS  first, then start slowly drinking clear fluids. Drink fluids such as: ? Water. Do not drink only water by itself. Doing that can make the salt (sodium) level in your body get too low (hyponatremia). ? Ice chips. ? Fruit juice that you have added water to (diluted). ? Low-calorie sports drinks.  Avoid: ? Alcohol. ? Drinks that have a lot of sugar. These include high-calorie sports drinks, fruit juice that does not have water added, and soda. ? Caffeine. ? Foods that are greasy or have a lot of fat or sugar.  Take over-the-counter and prescription medicines only as told by your doctor.  Do not take salt tablets. Doing that can make the salt level in your body get too high (hypernatremia).  Eat foods that have minerals (electrolytes). Examples include bananas, oranges, potatoes, tomatoes, and spinach.  Keep all follow-up visits as told by your doctor. This is important. Contact a doctor if:  You have belly (abdominal) pain that: ? Gets worse. ? Stays in one area (localizes).  You have a rash.  You have a stiff neck.  You get angry or annoyed more easily than normal (irritability).  You are more sleepy than normal.  You have a harder time waking up than normal.  You feel: ? Weak. ? Dizzy. ? Very thirsty.  You have peed (urinated) only a small amount of very dark pee during 6-8 hours. Get help right away if:  You have symptoms of   very bad dehydration.  You cannot drink fluids without throwing up (vomiting).  Your symptoms get worse with treatment.  You have a fever.  You have a very bad headache.  You are throwing up or having watery poop (diarrhea) and it: ? Gets worse. ? Does not go away.  You have blood or something green (bile) in your throw-up.  You have blood in your poop (stool). This may cause poop to look black and tarry.  You have not peed in 6-8 hours.  You pass out (faint).  Your heart rate when you are sitting still is more than 100 beats a  minute.  You have trouble breathing. This information is not intended to replace advice given to you by your health care provider. Make sure you discuss any questions you have with your health care provider. Document Released: 10/09/2009 Document Revised: 07/02/2016 Document Reviewed: 02/06/2016 Elsevier Interactive Patient Education  2018 Elsevier Inc.  

## 2018-09-21 NOTE — Telephone Encounter (Signed)
Gave pt avs and calendar  °

## 2018-09-21 NOTE — Progress Notes (Signed)
DISCONTINUE ON PATHWAY REGIMEN - Bladder     A cycle is every 21 days:     Gemcitabine      Cisplatin   **Always confirm dose/schedule in your pharmacy ordering system**  REASON: Toxicities / Adverse Event PRIOR TREATMENT: [id number]: Gemcitabine 1,000 mg/m2 D1, 8 + Cisplatin 70 mg/m2 D1 q21 Days for a Maximum of 6 Cycles TREATMENT RESPONSE: N/A - Adjuvant Therapy  START ON PATHWAY REGIMEN - Bladder     A cycle is every 21 days:     Carboplatin      Gemcitabine   **Always confirm dose/schedule in your pharmacy ordering system**  Patient Characteristics: Metastatic Disease, First Line, No Prior Neoadjuvant/Adjuvant Therapy, Poor Renal Function (CrCl < 50 mL/min), Unknown PD-L1 Expression AJCC M Category: M1b AJCC N Category: NX AJCC T Category: TX Current evidence of distant metastases<= Yes AJCC 8 Stage Grouping: IVB Line of Therapy: First Line Prior Neoadjuvant/Adjuvant Therapy<= No Renal Function: Poor Renal Function (CrCl < 50 mL/min) PD-L1 Expression Status: Unknown PD-L1 Expression Intent of Therapy: Non-Curative / Palliative Intent, Discussed with Patient

## 2018-09-21 NOTE — Progress Notes (Signed)
Hematology and Oncology Follow Up Visit  [name] [id number] 1951-01-16 67 y.o. 09/21/2018 9:22 AM Patient, No Pcp Paulino Door, MD   Principle Diagnosis: 67 year old woman with high-grade urothelial carcinoma arising from the bladder with metastatic disease to the bone, mediastinal adenopathy as well as the lung diagnosed in August 2019.  He was initially diagnosed with superficial bladder tumor in January 2018.   Prior Therapy: She is status post transurethral resection of a bladder tumor in the pathology revealed invasive high-grade urothelial carcinoma without any muscle invasion.  She had a repeat cystoscopy in February 2018 as well as in August 2018 without any evidence of recurrent disease.  She is status post radiation therapy to the thoracic and lumbar spine completed in September 2019.  Current therapy:   Gemcitabine and cisplatin therapy started on 08/31/2018.  She missed day 8 of cycle 1 because of hospitalization.  Interim History: [name] presents today for a follow-up visit.  Since her last visit, she received day 1 of cycle 1 of chemotherapy utilizing gemcitabine and cisplatin and tolerated it poorly.  He was hospitalized in the early part of September with complaints of nausea and vomiting and fever.  She was discharged to a skilled nursing facility and is recovering slowly at this time.  She continues to have issues with intractable nausea and has received intravenous hydration intermittently.  She has felt well in the last 24 hours with intravenous hydration.  This morning, she does not report any nausea or vomiting.  Performance status is improving as she participate in occupational therapy.  She does report some generalized weakness but she is getting stronger at this time.  She does not report any headaches, blurry vision, syncope or seizures. Does not report any fevers, chills or sweats.  Does not report any cough, wheezing or hemoptysis.  Does not report  any chest pain, palpitation, orthopnea or leg edema.   Does not report any constipation or diarrhea.  Does not report any skeletal complaints.    Does not report frequency, urgency or hematuria.  Does not report any skin rashes or lesions. Does not report any heat or cold intolerance.  Does not report any lymphadenopathy or petechiae.  Does not report any anxiety or depression.  Remaining review of systems is negative.    Medications: I have reviewed the patient's current medications.  Current Outpatient Medications  Medication Sig Dispense Refill  . acetaminophen (TYLENOL) 325 MG tablet Take 650 mg by mouth every 6 (six) hours as needed for mild pain, moderate pain or headache.    Marland Kitchen amLODipine (NORVASC) 10 MG tablet Take 1 tablet (10 mg total) by mouth every morning. 90 tablet 0  . Docusate Calcium (STOOL SOFTENER PO) Take 2 tablets by mouth daily as needed (constipation).    . fentaNYL (DURAGESIC - DOSED MCG/HR) 25 MCG/HR patch Place 1 patch (25 mcg total) onto the skin every 3 (three) days. 5 patch 0  . ferrous sulfate 325 (65 FE) MG tablet Start taking only after constipation is relieved. Take 1 tablet once daily for 1 week, then 1 tablet twice daily for 1 week and then 1 tablet three times daily. (Patient taking differently: Take 325 mg by mouth daily. ) 90 tablet 3  . lidocaine-prilocaine (EMLA) cream Apply 1 application topically as needed. 30 g 0  . LORazepam (ATIVAN) 0.5 MG tablet Take 1 tablet (0.5 mg total) by mouth every 4 (four) hours as needed for anxiety. 60 tablet 0  . metoprolol  succinate (TOPROL-XL) 100 MG 24 hr tablet Take 100 mg by mouth daily.  1  . montelukast (SINGULAIR) 10 MG tablet Take 10 mg by mouth daily.  3  . olmesartan (BENICAR) 40 MG tablet Take 40 mg by mouth daily.  0  . omeprazole (PRILOSEC) 40 MG capsule Take 40 mg by mouth daily.  1  . ondansetron (ZOFRAN ODT) 8 MG disintegrating tablet Take 1 tablet (8 mg total) by mouth every 8 (eight) hours as needed for  nausea. 20 tablet 0  . oxyCODONE-acetaminophen (PERCOCET/ROXICET) 5-325 MG tablet Take 1 tablet by mouth every 6 (six) hours as needed (for breakthrough pain). 30 tablet 0  . polyethylene glycol (MIRALAX / GLYCOLAX) packet Take 17 g by mouth every other day. 30 each 2  . prochlorperazine (COMPAZINE) 10 MG tablet Take 1 tablet (10 mg total) by mouth every 6 (six) hours as needed for nausea or vomiting. 30 tablet 0  . rosuvastatin (CRESTOR) 5 MG tablet Take 5 mg by mouth daily.  3  . senna-docusate (SENOKOT-S) 8.6-50 MG tablet Take 1 tablet by mouth daily as needed. (Patient taking differently: Take 1 tablet by mouth daily as needed for mild constipation or moderate constipation. ) 30 tablet 2   No current facility-administered medications for this visit.      Allergies:  Allergies  Allergen Reactions  . Tramadol Nausea And Vomiting    Past Medical History, Surgical history, Social history, and Family History were reviewed and updated.    Physical Exam:  Blood pressure (!) 171/81, pulse (!) 103, temperature 99 F (37.2 C), temperature source Oral, resp. rate 18, height 5\' 2"  (1.575 m), SpO2 97 %.   ECOG: 1 General appearance: alert and cooperative appeared without distress. Head: Normocephalic, without obvious abnormality Oropharynx: No oral thrush or ulcers. Eyes: No scleral icterus.  Pupils are equal and round reactive to light. Lymph nodes: Cervical, supraclavicular, and axillary nodes normal. Heart:regular rate and rhythm, S1, S2 normal, no murmur, click, rub or gallop Lung:chest clear, no wheezing, rales, normal symmetric air entry Abdomin: soft, non-tender, without masses or organomegaly. Neurological: No motor, sensory deficits.  Intact deep tendon reflexes. Skin: No rashes or lesions.  No ecchymosis or petechiae. Musculoskeletal: No joint deformity or effusion. Psychiatric: Mood and affect are appropriate.    Lab Results: Lab Results  Component Value Date   WBC 3.2  (L) 09/08/2018   HGB 9.6 (L) 09/08/2018   HCT 29.0 (L) 09/08/2018   MCV 80.6 09/08/2018   PLT 135 (L) 09/08/2018     Chemistry      Component Value Date/Time   NA 134 (L) 09/08/2018 0415   K 3.7 09/08/2018 0415   CL 98 09/08/2018 0415   CO2 28 09/08/2018 0415   BUN 11 09/08/2018 0415   CREATININE 0.76 09/08/2018 0415   CREATININE 1.13 (H) 08/31/2018 0853      Component Value Date/Time   CALCIUM 8.7 (L) 09/08/2018 0415   ALKPHOS 68 09/04/2018 0432   AST 16 09/04/2018 0432   AST 17 08/31/2018 0853   ALT 11 09/04/2018 0432   ALT 8 08/31/2018 0853   BILITOT 0.8 09/04/2018 0432   BILITOT 0.5 08/31/2018 0853      Impression and Plan:  67 year old woman with:  1.  High-grade urothelial carcinoma arising from the bladder with metastatic disease to the bone, adenopathy as well as the lung documented in August 2019.    She is currently receiving chemotherapy with gemcitabine and cisplatin and has tolerated day  1 of the first cycle with few complications including cytopenia, fever and hospitalization.  Risks and benefits of the current therapy and the natural course of her disease was reviewed today and treatment options were discussed.  I am in favor switching her to carboplatin based therapy for better tolerance.  She will receive gemcitabine and carboplatin day 1 every 3 weeks without day 8 therapy.  She is agreeable to proceed and will receive gemcitabine and carboplatin on October 4 and every 3 weeks after that without the aid therapy.  She will receive a total of 6 cycles of chemotherapy including the first cycle that was given early part of September.   2.  IV access: Cath inserted without complications.  Will be used moving forward for chemotherapy.  3.    Nausea: She has antiemetics available to her and currently receiving intravenous hydration.  Imaging of the brain may be needed to rule out CNS metastasis.  She denies any neurological symptoms at this time.  4.   Thoracic spine metastasis: She is status post radiation therapy to the spine with improvement in her pain.  5.  Pain: Manageable at this time with the current pain medication.  6.  Bone directed therapy: She would be a candidate for Xgeva or Zometa in the future after other issues improved.  7.  Prognosis and goals of care: Treatment remains palliative although performance status is adequate and aggressive therapy is warranted.  8.  Follow-up: We will be in next week for the second cycle of chemotherapy utilizing carboplatin and gemcitabine.  25  minutes was spent with the patient face-to-face today.  More than 50% of time was dedicated to reviewing the natural course of her disease, treatment options and coordinating plan of care.     Zola Button, MD 9/26/20199:22 AM

## 2018-09-22 ENCOUNTER — Inpatient Hospital Stay: Payer: Medicare Other

## 2018-09-27 ENCOUNTER — Telehealth: Payer: Self-pay | Admitting: Radiation Oncology

## 2018-09-27 ENCOUNTER — Inpatient Hospital Stay (HOSPITAL_COMMUNITY)
Admission: EM | Admit: 2018-09-27 | Discharge: 2018-10-02 | DRG: 641 | Disposition: A | Discharge status: Home / Self Care | Payer: Medicare Other | Attending: Internal Medicine | Admitting: Internal Medicine

## 2018-09-27 ENCOUNTER — Other Ambulatory Visit: Payer: Self-pay

## 2018-09-27 ENCOUNTER — Encounter (HOSPITAL_COMMUNITY): Payer: Self-pay

## 2018-09-27 DIAGNOSIS — Z95828 Presence of other vascular implants and grafts: Secondary | ICD-10-CM

## 2018-09-27 DIAGNOSIS — D630 Anemia in neoplastic disease: Secondary | ICD-10-CM | POA: Diagnosis present

## 2018-09-27 DIAGNOSIS — I10 Essential (primary) hypertension: Secondary | ICD-10-CM | POA: Diagnosis not present

## 2018-09-27 DIAGNOSIS — R112 Nausea with vomiting, unspecified: Secondary | ICD-10-CM | POA: Diagnosis present

## 2018-09-27 DIAGNOSIS — C7800 Secondary malignant neoplasm of unspecified lung: Secondary | ICD-10-CM | POA: Diagnosis present

## 2018-09-27 DIAGNOSIS — C679 Malignant neoplasm of bladder, unspecified: Secondary | ICD-10-CM | POA: Diagnosis present

## 2018-09-27 DIAGNOSIS — K259 Gastric ulcer, unspecified as acute or chronic, without hemorrhage or perforation: Secondary | ICD-10-CM | POA: Diagnosis not present

## 2018-09-27 DIAGNOSIS — Z885 Allergy status to narcotic agent status: Secondary | ICD-10-CM | POA: Diagnosis not present

## 2018-09-27 DIAGNOSIS — Z79899 Other long term (current) drug therapy: Secondary | ICD-10-CM | POA: Diagnosis not present

## 2018-09-27 DIAGNOSIS — C7951 Secondary malignant neoplasm of bone: Secondary | ICD-10-CM | POA: Diagnosis present

## 2018-09-27 DIAGNOSIS — R1114 Bilious vomiting: Secondary | ICD-10-CM | POA: Diagnosis not present

## 2018-09-27 DIAGNOSIS — E876 Hypokalemia: Secondary | ICD-10-CM | POA: Diagnosis not present

## 2018-09-27 DIAGNOSIS — K5900 Constipation, unspecified: Secondary | ICD-10-CM | POA: Diagnosis present

## 2018-09-27 DIAGNOSIS — E8889 Other specified metabolic disorders: Secondary | ICD-10-CM | POA: Diagnosis present

## 2018-09-27 DIAGNOSIS — Z923 Personal history of irradiation: Secondary | ICD-10-CM | POA: Diagnosis not present

## 2018-09-27 DIAGNOSIS — Z79891 Long term (current) use of opiate analgesic: Secondary | ICD-10-CM

## 2018-09-27 DIAGNOSIS — K449 Diaphragmatic hernia without obstruction or gangrene: Secondary | ICD-10-CM | POA: Diagnosis present

## 2018-09-27 DIAGNOSIS — K279 Peptic ulcer, site unspecified, unspecified as acute or chronic, without hemorrhage or perforation: Secondary | ICD-10-CM | POA: Diagnosis not present

## 2018-09-27 DIAGNOSIS — Z960 Presence of urogenital implants: Secondary | ICD-10-CM | POA: Diagnosis present

## 2018-09-27 DIAGNOSIS — K838 Other specified diseases of biliary tract: Secondary | ICD-10-CM | POA: Diagnosis not present

## 2018-09-27 DIAGNOSIS — C7989 Secondary malignant neoplasm of other specified sites: Secondary | ICD-10-CM | POA: Diagnosis present

## 2018-09-27 DIAGNOSIS — C779 Secondary and unspecified malignant neoplasm of lymph node, unspecified: Secondary | ICD-10-CM | POA: Diagnosis present

## 2018-09-27 NOTE — Telephone Encounter (Signed)
l called the patient's sister at the patient's request to follow up on the patient's nausea. Our plan was to pursue CNS imaging if she was having persistent symptoms. I had to leave a message regarding this.

## 2018-09-27 NOTE — ED Triage Notes (Addendum)
Pt reports nausea and vomiting since Saturday after she left [facility] for rehab. Hx of bladder cancer. Pt had last chemo treatment on Friday. Denies diarrhea and fever.

## 2018-09-28 ENCOUNTER — Observation Stay (HOSPITAL_COMMUNITY): Payer: Medicare Other

## 2018-09-28 ENCOUNTER — Encounter (HOSPITAL_COMMUNITY): Payer: Self-pay | Admitting: Emergency Medicine

## 2018-09-28 DIAGNOSIS — R1114 Bilious vomiting: Secondary | ICD-10-CM

## 2018-09-28 DIAGNOSIS — C7951 Secondary malignant neoplasm of bone: Secondary | ICD-10-CM

## 2018-09-28 DIAGNOSIS — K838 Other specified diseases of biliary tract: Secondary | ICD-10-CM | POA: Diagnosis not present

## 2018-09-28 DIAGNOSIS — C679 Malignant neoplasm of bladder, unspecified: Secondary | ICD-10-CM

## 2018-09-28 DIAGNOSIS — E876 Hypokalemia: Secondary | ICD-10-CM | POA: Diagnosis not present

## 2018-09-28 DIAGNOSIS — I10 Essential (primary) hypertension: Secondary | ICD-10-CM | POA: Diagnosis not present

## 2018-09-28 DIAGNOSIS — R112 Nausea with vomiting, unspecified: Secondary | ICD-10-CM

## 2018-09-28 DIAGNOSIS — K259 Gastric ulcer, unspecified as acute or chronic, without hemorrhage or perforation: Secondary | ICD-10-CM | POA: Diagnosis not present

## 2018-09-28 LAB — HEPATIC FUNCTION PANEL
ALT: 12 U/L (ref 0–44)
AST: 19 U/L (ref 15–41)
Albumin: 3 g/dL — ABNORMAL LOW (ref 3.5–5.0)
Alkaline Phosphatase: 82 U/L (ref 38–126)
BILIRUBIN DIRECT: 0.1 mg/dL (ref 0.0–0.2)
BILIRUBIN INDIRECT: 0.9 mg/dL (ref 0.3–0.9)
Total Bilirubin: 1 mg/dL (ref 0.3–1.2)
Total Protein: 6.8 g/dL (ref 6.5–8.1)

## 2018-09-28 LAB — I-STAT CHEM 8, ED
BUN: 10 mg/dL (ref 8–23)
CALCIUM ION: 1.2 mmol/L (ref 1.15–1.40)
Chloride: 98 mmol/L (ref 98–111)
Creatinine, Ser: 0.5 mg/dL (ref 0.44–1.00)
GLUCOSE: 108 mg/dL — AB (ref 70–99)
HCT: 34 % — ABNORMAL LOW (ref 36.0–46.0)
Hemoglobin: 11.6 g/dL — ABNORMAL LOW (ref 12.0–15.0)
Potassium: 2.9 mmol/L — ABNORMAL LOW (ref 3.5–5.1)
SODIUM: 137 mmol/L (ref 135–145)
TCO2: 24 mmol/L (ref 22–32)

## 2018-09-28 LAB — BASIC METABOLIC PANEL
ANION GAP: 13 (ref 5–15)
BUN: 11 mg/dL (ref 8–23)
CALCIUM: 9.4 mg/dL (ref 8.9–10.3)
CO2: 23 mmol/L (ref 22–32)
Chloride: 104 mmol/L (ref 98–111)
Creatinine, Ser: 0.65 mg/dL (ref 0.44–1.00)
GFR calc Af Amer: >60 mL/min (ref >60)
Glucose, Bld: 94 mg/dL (ref 70–99)
Potassium: 3.9 mmol/L (ref 3.5–5.1)
SODIUM: 140 mmol/L (ref 135–145)

## 2018-09-28 LAB — CBC WITH DIFFERENTIAL/PLATELET
BASOS ABS: 0 10*3/uL (ref 0.0–0.1)
BASOS PCT: 0 %
BASOS PCT: 1 %
Basophils Absolute: 0 10*3/uL (ref 0.0–0.1)
EOS ABS: 0.2 10*3/uL (ref 0.0–0.7)
Eosinophils Absolute: 0.2 10*3/uL (ref 0.0–0.7)
Eosinophils Relative: 2 %
Eosinophils Relative: 3 %
HCT: 34.3 % — ABNORMAL LOW (ref 36.0–46.0)
HCT: 35.4 % — ABNORMAL LOW (ref 36.0–46.0)
HEMOGLOBIN: 11.5 g/dL — AB (ref 12.0–15.0)
HEMOGLOBIN: 11.7 g/dL — AB (ref 12.0–15.0)
LYMPHS ABS: 1 10*3/uL (ref 0.7–4.0)
Lymphocytes Relative: 13 %
Lymphocytes Relative: 15 %
Lymphs Abs: 1.2 10*3/uL (ref 0.7–4.0)
MCH: 26.5 pg (ref 26.0–34.0)
MCH: 26.7 pg (ref 26.0–34.0)
MCHC: 33.1 g/dL (ref 30.0–36.0)
MCHC: 33.5 g/dL (ref 30.0–36.0)
MCV: 79.8 fL (ref 78.0–100.0)
MCV: 80.1 fL (ref 78.0–100.0)
Monocytes Absolute: 0.5 10*3/uL (ref 0.1–1.0)
Monocytes Absolute: 0.6 10*3/uL (ref 0.1–1.0)
Monocytes Relative: 6 %
Monocytes Relative: 8 %
NEUTROS ABS: 5.7 10*3/uL (ref 1.7–7.7)
NEUTROS ABS: 6.2 10*3/uL (ref 1.7–7.7)
NEUTROS PCT: 75 %
NEUTROS PCT: 77 %
Platelets: 212 10*3/uL (ref 150–400)
Platelets: 229 10*3/uL (ref 150–400)
RBC: 4.3 MIL/uL (ref 3.87–5.11)
RBC: 4.42 MIL/uL (ref 3.87–5.11)
RDW: 13.2 % (ref 11.5–15.5)
RDW: 13.3 % (ref 11.5–15.5)
WBC: 7.5 10*3/uL (ref 4.0–10.5)
WBC: 8.1 10*3/uL (ref 4.0–10.5)

## 2018-09-28 LAB — URINALYSIS, ROUTINE W REFLEX MICROSCOPIC
Bilirubin Urine: NEGATIVE
GLUCOSE, UA: NEGATIVE mg/dL
Ketones, ur: 80 mg/dL — AB
Leukocytes, UA: NEGATIVE
Nitrite: NEGATIVE
PROTEIN: 100 mg/dL — AB
Specific Gravity, Urine: 1.023 (ref 1.005–1.030)
pH: 5 (ref 5.0–8.0)

## 2018-09-28 LAB — MAGNESIUM: Magnesium: 1.7 mg/dL (ref 1.7–2.4)

## 2018-09-28 MED ORDER — LORAZEPAM 0.5 MG PO TABS
0.5000 mg | ORAL_TABLET | ORAL | Status: DC | PRN
  Filled 2018-09-28: qty 1

## 2018-09-28 MED ORDER — AMLODIPINE BESYLATE 10 MG PO TABS
10.0000 mg | ORAL_TABLET | Freq: Every morning | ORAL | Status: DC
  Administered 2018-09-28 – 2018-10-02 (×5): 10 mg via ORAL
  Filled 2018-09-28: qty 2
  Filled 2018-09-28 (×3): qty 1
  Filled 2018-09-28: qty 2

## 2018-09-28 MED ORDER — ONDANSETRON HCL 4 MG/2ML IJ SOLN
4.0000 mg | Freq: Once | INTRAMUSCULAR | Status: AC
  Administered 2018-09-28: 4 mg via INTRAVENOUS
  Filled 2018-09-28: qty 2

## 2018-09-28 MED ORDER — MORPHINE SULFATE (PF) 2 MG/ML IV SOLN
2.0000 mg | Freq: Once | INTRAVENOUS | Status: AC
  Administered 2018-09-28: 2 mg via INTRAVENOUS
  Filled 2018-09-28: qty 1

## 2018-09-28 MED ORDER — SENNOSIDES-DOCUSATE SODIUM 8.6-50 MG PO TABS: 1.0000 | ORAL_TABLET | Freq: Every day | ORAL | Status: DC | PRN

## 2018-09-28 MED ORDER — MONTELUKAST SODIUM 10 MG PO TABS
10.0000 mg | ORAL_TABLET | Freq: Every day | ORAL | Status: DC
  Administered 2018-09-28 – 2018-10-02 (×5): 10 mg via ORAL
  Filled 2018-09-28 (×5): qty 1

## 2018-09-28 MED ORDER — ONDANSETRON 4 MG PO TBDP
8.0000 mg | ORAL_TABLET | Freq: Three times a day (TID) | ORAL | Status: DC | PRN
  Administered 2018-10-02: 8 mg via ORAL
  Filled 2018-09-28: qty 2

## 2018-09-28 MED ORDER — SODIUM CHLORIDE 0.9% FLUSH
10.0000 mL | INTRAVENOUS | Status: DC | PRN
  Administered 2018-10-02 (×2): 10 mL
  Filled 2018-09-28 (×2): qty 40

## 2018-09-28 MED ORDER — HYDRALAZINE HCL 20 MG/ML IJ SOLN
10.0000 mg | INTRAMUSCULAR | Status: DC | PRN
  Administered 2018-09-28: 10 mg via INTRAVENOUS
  Filled 2018-09-28: qty 1

## 2018-09-28 MED ORDER — FENTANYL 25 MCG/HR TD PT72
25.0000 ug | MEDICATED_PATCH | TRANSDERMAL | Status: DC
  Administered 2018-09-30: 25 ug via TRANSDERMAL
  Filled 2018-09-28: qty 1

## 2018-09-28 MED ORDER — ONDANSETRON HCL 4 MG PO TABS
4.0000 mg | ORAL_TABLET | Freq: Four times a day (QID) | ORAL | Status: DC | PRN
  Administered 2018-09-29: 4 mg via ORAL
  Filled 2018-09-28: qty 1

## 2018-09-28 MED ORDER — METOPROLOL SUCCINATE ER 100 MG PO TB24
100.0000 mg | ORAL_TABLET | Freq: Every day | ORAL | Status: DC
  Administered 2018-09-28 – 2018-10-02 (×5): 100 mg via ORAL
  Filled 2018-09-28 (×5): qty 1

## 2018-09-28 MED ORDER — POTASSIUM CHLORIDE IN NACL 20-0.9 MEQ/L-% IV SOLN
INTRAVENOUS | Status: DC
  Administered 2018-09-28 – 2018-10-02 (×9): via INTRAVENOUS
  Filled 2018-09-28 (×10): qty 1000

## 2018-09-28 MED ORDER — POLYETHYLENE GLYCOL 3350 17 G PO PACK
17.0000 g | PACK | ORAL | Status: DC
  Administered 2018-09-30 – 2018-10-02 (×2): 17 g via ORAL
  Filled 2018-09-28 (×4): qty 1

## 2018-09-28 MED ORDER — POTASSIUM CHLORIDE 10 MEQ/100ML IV SOLN
10.0000 meq | INTRAVENOUS | Status: AC
  Administered 2018-09-28 (×5): 10 meq via INTRAVENOUS
  Filled 2018-09-28 (×5): qty 100

## 2018-09-28 MED ORDER — ACETAMINOPHEN 325 MG PO TABS
650.0000 mg | ORAL_TABLET | Freq: Four times a day (QID) | ORAL | Status: DC | PRN
  Administered 2018-09-30: 650 mg via ORAL
  Filled 2018-09-28 (×2): qty 2

## 2018-09-28 MED ORDER — SODIUM CHLORIDE 0.9 % IV SOLN
INTRAVENOUS | Status: DC
  Administered 2018-09-28: 02:00:00 via INTRAVENOUS

## 2018-09-28 MED ORDER — ROSUVASTATIN CALCIUM 5 MG PO TABS
5.0000 mg | ORAL_TABLET | Freq: Every day | ORAL | Status: DC
  Administered 2018-09-28 – 2018-10-01 (×4): 5 mg via ORAL
  Filled 2018-09-28 (×4): qty 1

## 2018-09-28 MED ORDER — ONDANSETRON HCL 4 MG/2ML IJ SOLN
4.0000 mg | Freq: Four times a day (QID) | INTRAMUSCULAR | Status: DC | PRN
  Administered 2018-09-28: 4 mg via INTRAVENOUS
  Filled 2018-09-28 (×2): qty 2

## 2018-09-28 MED ORDER — FERROUS SULFATE 325 (65 FE) MG PO TABS
325.0000 mg | ORAL_TABLET | Freq: Every day | ORAL | Status: DC
  Administered 2018-09-28 – 2018-10-02 (×5): 325 mg via ORAL
  Filled 2018-09-28 (×5): qty 1

## 2018-09-28 MED ORDER — PANTOPRAZOLE SODIUM 40 MG PO TBEC
40.0000 mg | DELAYED_RELEASE_TABLET | Freq: Every day | ORAL | Status: DC
  Administered 2018-09-28 – 2018-09-29 (×2): 40 mg via ORAL
  Filled 2018-09-28 (×2): qty 1

## 2018-09-28 MED ORDER — IRBESARTAN 300 MG PO TABS
300.0000 mg | ORAL_TABLET | Freq: Every day | ORAL | Status: DC
  Administered 2018-09-28 – 2018-10-02 (×5): 300 mg via ORAL
  Filled 2018-09-28 (×5): qty 1

## 2018-09-28 MED ORDER — ACETAMINOPHEN 650 MG RE SUPP: 650.0000 mg | Freq: Four times a day (QID) | RECTAL | Status: DC | PRN

## 2018-09-28 MED ORDER — ENOXAPARIN SODIUM 40 MG/0.4ML ~~LOC~~ SOLN
40.0000 mg | Freq: Every day | SUBCUTANEOUS | Status: DC
  Administered 2018-09-28 – 2018-10-02 (×5): 40 mg via SUBCUTANEOUS
  Filled 2018-09-28 (×5): qty 0.4

## 2018-09-28 MED ORDER — SODIUM CHLORIDE 0.9 % IV SOLN
INTRAVENOUS | Status: DC
  Administered 2018-09-28 (×2): via INTRAVENOUS

## 2018-09-28 NOTE — H&P (Signed)
History and Physical    [name] JFH:[id number] DOB: 1951-01-16 DOA: 09/27/2018  PCP: Patient, No Pcp Per  Patient coming from: Home.  Chief Complaint: Nausea vomiting.  HPI: Faith [name] is a 67 y.o. female with history of metastatic bladder cancer who received chemotherapy in the first week of September following which patient was admitted for [facility] picture at the time patient has been having nausea vomiting and eventually was discharged to rehab when patient was discharged from rehab last Saturday 5 days ago.  Following day patient started developing nausea vomiting again with upper abdominal discomfort.  Denies any diarrhea has had normal bowel movement.  Denies any headache fever chills or any blood in the vomitus.  ED Course: In the ER patient's UA shows ketosis and lab work show hypokalemia.  Patient in the ER was having persistent nausea and was started on fluids antiemetics and admitted for further observation.  On my exam patient abdomen appears benign.  No rigidity no rebound tenderness bowel sounds present.  LFTs were normal.  Review of Systems: As per HPI, rest all negative.   Past Medical History:  Diagnosis Date  . Anemia due to acute blood loss 11/24/2016   TRANSFUSED PRBC X2 UNITS 11-25-2016  . Bladder neoplasm   . Cancer ([place])   . Constipation   . Gross hematuria   . Hypertension   . Urgency of urination     Past Surgical History:  Procedure Laterality Date  . CYSTOSCOPY W/ URETERAL STENT PLACEMENT Right 12/28/2016   Procedure: CYSTOSCOPY WITH RIGHT RETROGRADE URETERAL STENT PLACEMENT;  Surgeon: Festus Aloe, MD;  Location: [facility];  Service: Urology;  Laterality: Right;  . IR IMAGING GUIDED PORT INSERTION  08/30/2018  . TRANSURETHRAL RESECTION OF BLADDER TUMOR WITH MITOMYCIN-C N/A 12/28/2016   Procedure: TRANSURETHRAL RESECTION OF BLADDER TUMOR GREATER THAN 5 CM;  Surgeon: Festus Aloe, MD;  Location: [facility];   Service: Urology;  Laterality: N/A;  . TUBAL LIGATION Bilateral 1990's     reports that she has never smoked. She has never used smokeless tobacco. She reports that she does not drink alcohol or use drugs.  Allergies  Allergen Reactions  . Tramadol Nausea And Vomiting    Family History  Problem Relation Age of Onset  . Leukemia Father     Prior to Admission medications   Medication Sig Start Date End Date Taking? Authorizing Provider  acetaminophen (TYLENOL) 325 MG tablet Take 650 mg by mouth every 6 (six) hours as needed for mild pain, moderate pain or headache.   Yes [provider]  amLODipine (NORVASC) 10 MG tablet Take 1 tablet (10 mg total) by mouth every morning. 03/02/17  Yes Robyn Haber, MD  Docusate Calcium (STOOL SOFTENER PO) Take 2 tablets by mouth daily as needed (constipation).   Yes [provider]  fentaNYL (DURAGESIC - DOSED MCG/HR) 25 MCG/HR patch Place 1 patch (25 mcg total) onto the skin every 3 (three) days. 09/09/18  Yes Shelly Coss, MD  ferrous sulfate 325 (65 FE) MG tablet Start taking only after constipation is relieved. Take 1 tablet once daily for 1 week, then 1 tablet twice daily for 1 week and then 1 tablet three times daily. Patient taking differently: Take 325 mg by mouth daily.  11/26/16  Yes Bonnielee Haff, MD  lidocaine-prilocaine (EMLA) cream Apply 1 application topically as needed. Patient taking differently: Apply 1 application topically as needed (port).  08/18/18  Yes Wyatt Portela, MD  LORazepam (ATIVAN) 0.5 MG tablet Take 1 tablet (0.5 mg total) by mouth every 4 (four) hours as needed for anxiety. 09/20/18  Yes Hayden Pedro, PA-C  metoprolol succinate (TOPROL-XL) 100 MG 24 hr tablet Take 100 mg by mouth daily. 07/04/18  Yes [provider]  montelukast (SINGULAIR) 10 MG tablet Take 10 mg by mouth daily. 07/14/18  Yes [provider]  olmesartan (BENICAR) 40 MG tablet Take 40 mg by mouth daily.  05/19/18  Yes [provider]  omeprazole (PRILOSEC) 40 MG capsule Take 40 mg by mouth daily. 07/03/18  Yes [provider]  ondansetron (ZOFRAN ODT) 8 MG disintegrating tablet Take 1 tablet (8 mg total) by mouth every 8 (eight) hours as needed for nausea. 09/03/18  Yes Varney Biles, MD  oxyCODONE-acetaminophen (PERCOCET/ROXICET) 5-325 MG tablet Take 1 tablet by mouth every 6 (six) hours as needed (for breakthrough pain). 09/08/18  Yes Shelly Coss, MD  polyethylene glycol (MIRALAX / GLYCOLAX) packet Take 17 g by mouth every other day. 03/02/17  Yes Robyn Haber, MD  prochlorperazine (COMPAZINE) 10 MG tablet Take 1 tablet (10 mg total) by mouth every 6 (six) hours as needed for nausea or vomiting. 08/18/18  Yes Wyatt Portela, MD  rosuvastatin (CRESTOR) 5 MG tablet Take 5 mg by mouth daily. 07/05/18  Yes [provider]  senna-docusate (SENOKOT-S) 8.6-50 MG tablet Take 1 tablet by mouth daily as needed. Patient taking differently: Take 1 tablet by mouth daily as needed for mild constipation or moderate constipation.  03/02/17  Yes Robyn Haber, MD    Physical Exam: Vitals:   09/27/18 2357 09/28/18 0000 09/28/18 0030 09/28/18 0100  BP: (!) 172/87 (!) 164/88 (!) 170/87 (!) 165/96  Pulse: 84 89 87 85  Resp: 17  15 15   Temp:      TempSrc:      SpO2: 100% 98% 97% 97%      Constitutional: Moderately built and nourished. Vitals:   09/27/18 2357 09/28/18 0000 09/28/18 0030 09/28/18 0100  BP: (!) 172/87 (!) 164/88 (!) 170/87 (!) 165/96  Pulse: 84 89 87 85  Resp: 17  15 15   Temp:      TempSrc:      SpO2: 100% 98% 97% 97%   Eyes: Anicteric no pallor. ENMT: No discharge from the ears eyes nose or mouth. Neck: No mass or.  No neck rigidity but no JVD appreciated. Respiratory: No rhonchi or crepitations. Cardiovascular: S1-S2 heard no murmurs appreciated. Abdomen: Soft nontender bowel sounds present.  No rebound tenderness bowel sounds present.  No guarding no  rigidity. Musculoskeletal: No edema.  No joint effusion. Skin: No rash.  Skin appears warm. Neurologic: Alert awake oriented to time place and person.  Moves all exam is 5 x 5. Psychiatric: Appears normal.   Labs on Admission: I have personally reviewed following labs and imaging studies  CBC: Recent Labs  Lab 09/21/18 0905 09/27/18 2353 09/27/18 2358  WBC 3.0* 7.5  --   NEUTROABS 1.6 5.7  --   HGB 10.9* 11.7* 11.6*  HCT 33.5* 35.4* 34.0*  MCV 81.5 80.1  --   PLT 400 229  --    Basic Metabolic Panel: Recent Labs  Lab 09/21/18 0905 09/27/18 2358  NA 139 137  K 3.5 2.9*  CL 101 98  CO2 26  --   GLUCOSE 90 108*  BUN 9 10  CREATININE 0.68 0.50  CALCIUM 9.6  --    GFR: CrCl cannot be calculated (Unknown ideal weight.).  Liver Function Tests: Recent Labs  Lab 09/21/18 0905  AST 17  ALT 11  ALKPHOS 76  BILITOT 0.3  PROT 7.2  ALBUMIN 2.8*   No results for input(s): LIPASE, AMYLASE in the last 168 hours. No results for input(s): AMMONIA in the last 168 hours. Coagulation Profile: No results for input(s): INR, PROTIME in the last 168 hours. Cardiac Enzymes: No results for input(s): CKTOTAL, CKMB, CKMBINDEX, TROPONINI in the last 168 hours. BNP (last 3 results) No results for input(s): PROBNP in the last 8760 hours. HbA1C: No results for input(s): HGBA1C in the last 72 hours. CBG: No results for input(s): GLUCAP in the last 168 hours. Lipid Profile: No results for input(s): CHOL, HDL, LDLCALC, TRIG, CHOLHDL, LDLDIRECT in the last 72 hours. Thyroid Function Tests: No results for input(s): TSH, T4TOTAL, FREET4, T3FREE, THYROIDAB in the last 72 hours. Anemia Panel: No results for input(s): VITAMINB12, FOLATE, FERRITIN, TIBC, IRON, RETICCTPCT in the last 72 hours. Urine analysis:    Component Value Date/Time   COLORURINE STRAW (A) 09/03/2018 1754   APPEARANCEUR CLEAR 09/03/2018 1754   LABSPEC 1.021 09/03/2018 1754   PHURINE 7.0 09/03/2018 1754   GLUCOSEU  NEGATIVE 09/03/2018 1754   HGBUR SMALL (A) 09/03/2018 1754   BILIRUBINUR NEGATIVE 09/03/2018 1754   KETONESUR NEGATIVE 09/03/2018 1754   PROTEINUR NEGATIVE 09/03/2018 1754   NITRITE NEGATIVE 09/03/2018 1754   LEUKOCYTESUR TRACE (A) 09/03/2018 1754   Sepsis Labs: @LABRCNTIP (procalcitonin:4,lacticidven:4) )No results found for this or any previous visit (from the past 240 hour(s)).   Radiological Exams on Admission: No results found.   Assessment/Plan Principal Problem:   Nausea & vomiting Active Problems:   HTN (hypertension)   Urothelial carcinoma of bladder (HCC)   Bone metastasis (HCC)   Hypokalemia    1. Intractable nausea vomiting -has been persistent cause not clear.  Reviewed patient's oncologist note there was some suggestion that if nausea persist to get a CT head to rule out any metastasis.  I have ordered CT head.  Closely observe.  I have continued patient on antiemetics and IV fluids. 2. Hypertension on ARB metoprolol amlodipine. 3. Metastatic bladder cancer per oncologist. 4. Hypokalemia likely from vomiting.  Replace and recheck. 5. Anemia follow CBC.   DVT prophylaxis: Lovenox. Code Status: Full code. Family Communication: Discussed with patient. Disposition Plan: Home. Consults called: None. Admission status: Observation.   Rise Patience MD [facility] Pager [phone].  If 7PM-7AM, please contact night-coverage [email or web address] Password [email or web address]  09/28/2018, 2:08 AM

## 2018-09-28 NOTE — Consult Note (Signed)
[facility] CONSULT Reason for consult: Nausea and vomiting Referring Physician: [facility] hospitalist.  PCP: Dr. Jetty Duhamel is an 67 y.o. female.  HPI: She has had a history of invasive bladder cancer removed transurethrally about 2 years ago with no recurrence.  She began to have vague back pain went on for several weeks.  She states she was taken Advil Tylenol may be some other OTC medications initially.  Work-up by PCP revealed metastatic disease to the thoracic and lumbar spine.  She had a course of radiation therapy in September and had 1 dose of cisplatin and it apparently was stopped.  She has had severe complaints of nausea and vomiting with vague upper abdominal pain that actually preceded the discovery of the metastatic disease to her spine.  So she is very clear that this preceded the discovery of that matter in the spine and the initiation of chemotherapy.She has been on iron therapy Duragesic patch Lorazepam metoprolol and omeprazole.  She has been constipated been taking GlycoLax.  She has been on a multitude of medications for nausea none of which is helped.  She presented to the ER with continued nausea and vomiting and ketosis on urinalysis and electrolyte abnormalities and was admitted.She had previously been admitted several weeks ago and been discharged to rehab where she stayed for several days but throughout this whole time her nausea and vomiting has been intractable.  She notes that she eats feels full vomits up food that looks only partially digested and pills.  Does not feel like anything is helped.  She has had a head CT which did not show any metastatic disease.  CT showed no gallstones and liver look fine no obvious lesions.  No gross signs of delayed gastric emptying.  Past Medical History:  Diagnosis Date  . Anemia due to acute blood loss 11/24/2016   TRANSFUSED PRBC X2 UNITS 11-25-2016  . Bladder neoplasm   . Cancer ([place])   . Constipation   . Gross  hematuria   . Hypertension   . Urgency of urination     Past Surgical History:  Procedure Laterality Date  . CYSTOSCOPY W/ URETERAL STENT PLACEMENT Right 12/28/2016   Procedure: CYSTOSCOPY WITH RIGHT RETROGRADE URETERAL STENT PLACEMENT;  Surgeon: Festus Aloe, MD;  Location: [facility];  Service: Urology;  Laterality: Right;  . IR IMAGING GUIDED PORT INSERTION  08/30/2018  . TRANSURETHRAL RESECTION OF BLADDER TUMOR WITH MITOMYCIN-C N/A 12/28/2016   Procedure: TRANSURETHRAL RESECTION OF BLADDER TUMOR GREATER THAN 5 CM;  Surgeon: Festus Aloe, MD;  Location: [facility];  Service: Urology;  Laterality: N/A;  . TUBAL LIGATION Bilateral 1990's    Family History  Problem Relation Age of Onset  . Leukemia Father     Social History:  reports that she has never smoked. She has never used smokeless tobacco. She reports that she does not drink alcohol or use drugs.  Allergies:  Allergies  Allergen Reactions  . Tramadol Nausea And Vomiting    Medications; Prior to Admission medications   Medication Sig Start Date End Date Taking? Authorizing Provider  acetaminophen (TYLENOL) 325 MG tablet Take 650 mg by mouth every 6 (six) hours as needed for mild pain, moderate pain or headache.   Yes [provider]  amLODipine (NORVASC) 10 MG tablet Take 1 tablet (10 mg total) by mouth every morning. 03/02/17  Yes Robyn Haber, MD  Docusate Calcium (STOOL SOFTENER PO) Take 2 tablets by mouth daily as needed (  constipation).   Yes [provider]  fentaNYL (DURAGESIC - DOSED MCG/HR) 25 MCG/HR patch Place 1 patch (25 mcg total) onto the skin every 3 (three) days. 09/09/18  Yes Shelly Coss, MD  ferrous sulfate 325 (65 FE) MG tablet Start taking only after constipation is relieved. Take 1 tablet once daily for 1 week, then 1 tablet twice daily for 1 week and then 1 tablet three times daily. Patient taking differently: Take 325 mg by mouth daily.  11/26/16   Yes Bonnielee Haff, MD  lidocaine-prilocaine (EMLA) cream Apply 1 application topically as needed. Patient taking differently: Apply 1 application topically as needed (port).  08/18/18  Yes Wyatt Portela, MD  LORazepam (ATIVAN) 0.5 MG tablet Take 1 tablet (0.5 mg total) by mouth every 4 (four) hours as needed for anxiety. 09/20/18  Yes Hayden Pedro, PA-C  metoprolol succinate (TOPROL-XL) 100 MG 24 hr tablet Take 100 mg by mouth daily. 07/04/18  Yes [provider]  montelukast (SINGULAIR) 10 MG tablet Take 10 mg by mouth daily. 07/14/18  Yes [provider]  olmesartan (BENICAR) 40 MG tablet Take 40 mg by mouth daily. 05/19/18  Yes [provider]  omeprazole (PRILOSEC) 40 MG capsule Take 40 mg by mouth daily. 07/03/18  Yes [provider]  ondansetron (ZOFRAN ODT) 8 MG disintegrating tablet Take 1 tablet (8 mg total) by mouth every 8 (eight) hours as needed for nausea. 09/03/18  Yes Varney Biles, MD  oxyCODONE-acetaminophen (PERCOCET/ROXICET) 5-325 MG tablet Take 1 tablet by mouth every 6 (six) hours as needed (for breakthrough pain). 09/08/18  Yes Shelly Coss, MD  polyethylene glycol (MIRALAX / GLYCOLAX) packet Take 17 g by mouth every other day. 03/02/17  Yes Robyn Haber, MD  prochlorperazine (COMPAZINE) 10 MG tablet Take 1 tablet (10 mg total) by mouth every 6 (six) hours as needed for nausea or vomiting. 08/18/18  Yes Wyatt Portela, MD  rosuvastatin (CRESTOR) 5 MG tablet Take 5 mg by mouth daily. 07/05/18  Yes [provider]  senna-docusate (SENOKOT-S) 8.6-50 MG tablet Take 1 tablet by mouth daily as needed. Patient taking differently: Take 1 tablet by mouth daily as needed for mild constipation or moderate constipation.  03/02/17  Yes Robyn Haber, MD   . amLODipine  10 mg Oral q morning - 10a  . enoxaparin (LOVENOX) injection  40 mg Subcutaneous Daily  . [START ON 09/30/2018] fentaNYL  25 mcg Transdermal Q72H  . ferrous sulfate  325  mg Oral Daily  . irbesartan  300 mg Oral Daily  . metoprolol succinate  100 mg Oral Daily  . montelukast  10 mg Oral Daily  . pantoprazole  40 mg Oral Daily  . polyethylene glycol  17 g Oral QODAY  . rosuvastatin  5 mg Oral q1800   PRN Meds acetaminophen **OR** acetaminophen, hydrALAZINE, LORazepam, ondansetron **OR** ondansetron (ZOFRAN) IV, ondansetron, senna-docusate, sodium chloride flush Results for orders placed or performed during the hospital encounter of 09/27/18 (from the past 48 hour(s))  CBC with Differential/Platelet     Status: Abnormal   Collection Time: 09/27/18 11:53 PM  Result Value Ref Range   WBC 7.5 4.0 - 10.5 K/uL   RBC 4.42 3.87 - 5.11 MIL/uL   Hemoglobin 11.7 (L) 12.0 - 15.0 g/dL   HCT 35.4 (L) 36.0 - 46.0 %   MCV 80.1 78.0 - 100.0 fL   MCH 26.5 26.0 - 34.0 pg   MCHC 33.1 30.0 - 36.0 g/dL   RDW 13.2 11.5 -  15.5 %   Platelets 229 150 - 400 K/uL   Neutrophils Relative % 75 %   Neutro Abs 5.7 1.7 - 7.7 K/uL   Lymphocytes Relative 13 %   Lymphs Abs 1.0 0.7 - 4.0 K/uL   Monocytes Relative 8 %   Monocytes Absolute 0.6 0.1 - 1.0 K/uL   Eosinophils Relative 3 %   Eosinophils Absolute 0.2 0.0 - 0.7 K/uL   Basophils Relative 1 %   Basophils Absolute 0.0 0.0 - 0.1 K/uL    Comment: Performed at [facility], [place] [address], [place], [place] [address]  I-stat chem 8, ed     Status: Abnormal   Collection Time: 09/27/18 11:58 PM  Result Value Ref Range   Sodium 137 135 - 145 mmol/L   Potassium 2.9 (L) 3.5 - 5.1 mmol/L   Chloride 98 98 - 111 mmol/L   BUN 10 8 - 23 mg/dL   Creatinine, Ser 0.50 0.44 - 1.00 mg/dL   Glucose, Bld 108 (H) 70 - 99 mg/dL   Calcium, Ion 1.20 1.15 - 1.40 mmol/L   TCO2 24 22 - 32 mmol/L   Hemoglobin 11.6 (L) 12.0 - 15.0 g/dL   HCT 34.0 (L) 36.0 - 46.0 %  Magnesium     Status: None   Collection Time: 09/28/18  1:56 AM  Result Value Ref Range   Magnesium 1.7 1.7 - 2.4 mg/dL    Comment: Performed at [facility], [place] [address] [place], [place] [address]  Urinalysis, Routine w reflex microscopic     Status: Abnormal   Collection Time: 09/28/18  3:37 AM  Result Value Ref Range   Color, Urine YELLOW YELLOW   APPearance CLOUDY (A) CLEAR   Specific Gravity, Urine 1.023 1.005 - 1.030   pH 5.0 5.0 - 8.0   Glucose, UA NEGATIVE NEGATIVE mg/dL   Hgb urine dipstick MODERATE (A) NEGATIVE   Bilirubin Urine NEGATIVE NEGATIVE   Ketones, ur 80 (A) NEGATIVE mg/dL   Protein, ur 100 (A) NEGATIVE mg/dL   Nitrite NEGATIVE NEGATIVE   Leukocytes, UA NEGATIVE NEGATIVE   RBC / HPF 0-5 0 - 5 RBC/hpf   WBC, UA 6-10 0 - 5 WBC/hpf   Bacteria, UA MANY (A) NONE SEEN   Squamous Epithelial / LPF 21-50 0 - 5   Mucus PRESENT     Comment: Performed at [facility], [place] [address], [place], [place] [address]  Basic metabolic panel     Status: None   Collection Time: 09/28/18  4:23 AM  Result Value Ref Range   Sodium 140 135 - 145 mmol/L   Potassium 3.9 3.5 - 5.1 mmol/L    Comment: DELTA CHECK NOTED REPEATED TO VERIFY NO VISIBLE HEMOLYSIS    Chloride 104 98 - 111 mmol/L   CO2 23 22 - 32 mmol/L   Glucose, Bld 94 70 - 99 mg/dL   BUN 11 8 - 23 mg/dL   Creatinine, Ser 0.65 0.44 - 1.00 mg/dL   Calcium 9.4 8.9 - 10.3 mg/dL   GFR calc non Af Amer >60 >60 mL/min   GFR calc Af Amer >60 >60 mL/min    Comment: (NOTE) The eGFR has been calculated using the CKD EPI equation. This calculation has not been validated in all clinical situations. eGFR's persistently <60 mL/min signify possible Chronic Kidney Disease.    Anion gap 13 5 - 15    Comment: Performed at [facility], [address] [place],  [place] [address]  Hepatic function panel     Status: Abnormal   Collection Time: 09/28/18  4:23 AM  Result Value Ref Range   Total Protein 6.8 6.5 - 8.1 g/dL   Albumin 3.0 (L) 3.5 - 5.0 g/dL   AST 19 15 - 41 U/L   ALT 12 0 - 44 U/L   Alkaline Phosphatase 82 38 - 126  U/L   Total Bilirubin 1.0 0.3 - 1.2 mg/dL   Bilirubin, Direct 0.1 0.0 - 0.2 mg/dL   Indirect Bilirubin 0.9 0.3 - 0.9 mg/dL    Comment: Performed at [facility], [place] [address], [place], [place] [address]  CBC WITH DIFFERENTIAL     Status: Abnormal   Collection Time: 09/28/18  4:23 AM  Result Value Ref Range   WBC 8.1 4.0 - 10.5 K/uL   RBC 4.30 3.87 - 5.11 MIL/uL   Hemoglobin 11.5 (L) 12.0 - 15.0 g/dL   HCT 34.3 (L) 36.0 - 46.0 %   MCV 79.8 78.0 - 100.0 fL   MCH 26.7 26.0 - 34.0 pg   MCHC 33.5 30.0 - 36.0 g/dL   RDW 13.3 11.5 - 15.5 %   Platelets 212 150 - 400 K/uL   Neutrophils Relative % 77 %   Neutro Abs 6.2 1.7 - 7.7 K/uL   Lymphocytes Relative 15 %   Lymphs Abs 1.2 0.7 - 4.0 K/uL   Monocytes Relative 6 %   Monocytes Absolute 0.5 0.1 - 1.0 K/uL   Eosinophils Relative 2 %   Eosinophils Absolute 0.2 0.0 - 0.7 K/uL   Basophils Relative 0 %   Basophils Absolute 0.0 0.0 - 0.1 K/uL    Comment: Performed at [facility], [place] [address] [place], [place] [address]    Ct Head Wo Contrast  Result Date: 09/28/2018 CLINICAL DATA:  Nausea, vomiting. EXAM: CT HEAD WITHOUT CONTRAST TECHNIQUE: Contiguous axial images were obtained from the base of the skull through the vertex without intravenous contrast. COMPARISON:  None. FINDINGS: Brain: No evidence of acute infarction, hemorrhage, hydrocephalus, extra-axial collection or mass lesion/mass effect. Vascular: No hyperdense vessel or unexpected calcification. Skull: Normal. Negative for fracture or focal lesion. Sinuses/Orbits: No acute finding. Other: None. IMPRESSION: Normal head CT. Electronically Signed   By: Marijo Conception, M.D.   On: 09/28/2018 09:07   Dg Abd Acute W/chest  Result Date: 09/28/2018 CLINICAL DATA:  Nausea/vomiting, bladder cancer EXAM: DG ABDOMEN ACUTE W/ 1V CHEST COMPARISON:  Chest radiographs and CT abdomen/pelvis dated 09/03/2018 FINDINGS: Lingular scarring. Additional platelike  scarring/atelectasis in the right lower lung. No focal consolidation. No pleural effusion or pneumothorax. The heart is top-normal in size. Right chest power port terminates in the mid SVC. Nonobstructive bowel gas pattern. No evidence of free air on the lateral decubitus view. Mild degenerative changes the lower lumbar spine. IMPRESSION: No evidence of acute cardiopulmonary disease. No evidence of small bowel obstruction or free air. Electronically Signed   By: Julian Hy M.D.   On: 09/28/2018 02:35               Blood pressure 135/75, pulse 85, temperature 98.4 F (36.9 C), temperature source Oral, resp. rate 16, height 5' 2"  (1.575 m), weight 71.5 kg, SpO2 98 %.  Physical exam:   General--Pleasant African-American female appears comfortable in no distress ENT--nonicteric Neck--supple Heart--regular rate and rhythm without murmurs or gallops Lungs--clear Abdomen--nondistended with possibly mild upper abdominal tenderness questionable right greater than left no masses palpated Psych--her mood  and affect are appropriate alert and oriented  Assessment: 1.  Nausea and vomiting.  This appears to be somewhat intractable.  Patient's very clear that her symptoms began before she received chemotherapy and really before she even knew that she had recurrent disease.  She was taken some Advil and other medications for her back pain prior to the diagnosis it is possible she could have developed an ulcer.  Biliary tract disease is also possible she has not had an ultrasound. 2.  Bladder cancer.  This was treated 2 years ago and she is had no recurrence up until now.  Is now metastatic to the lumbar sacral and thoracic spine and she is been having back pain.  Plan: Agree with treating her nausea and vomiting symptomatically.  She does have some exposure to NSAIDs and with the vomiting we could consider an outlet obstruction.  I think we need to go ahead with EGD and ultrasound of the  gallbladder as well.   Nancy Fetter 09/28/2018, 4:23 PM   This note was created using voice recognition software and minor errors may Have occurred unintentionally. Pager: [phone] If no answer or after hours call [phone]

## 2018-09-28 NOTE — ED Notes (Signed)
ED TO INPATIENT HANDOFF REPORT  Name/Age/Gender Faith [name] 67 y.o. female  Code Status    Code Status Orders  (From admission, onward)         Start     Ordered   09/28/18 0207  Full code  Continuous     09/28/18 0207        Code Status History    Date Active Date Inactive Code Status Order ID Comments User Context   09/03/2018 1956 09/09/2018 0046 Full Code [id number]  Faith Bridge, DO Inpatient   11/25/2016 0057 11/26/2016 1630 Full Code [id number]  Etta Quill, DO ED      Home/SNF/Other Home  Chief Complaint Nasuea;Emesis  Level of Care/Admitting Diagnosis ED Disposition    ED Disposition Condition Comment   Admit  Hospital Area: [name] [[id number]]  Level of Care: Telemetry [5]  Admit to tele based on following criteria: Monitor QTC interval  Diagnosis: Nausea & vomiting [154008]  Admitting Physician: Rise Patience [phone]  Attending Physician: Rise Patience [email or web address]  PT Class (Do Not Modify): Observation [104]  PT Acc Code (Do Not Modify): Observation [10022]       Medical History Past Medical History:  Diagnosis Date  . Anemia due to acute blood loss 11/24/2016   TRANSFUSED PRBC X2 UNITS 11-25-2016  . Bladder neoplasm   . Cancer ([place])   . Constipation   . Gross hematuria   . Hypertension   . Urgency of urination     Allergies Allergies  Allergen Reactions  . Tramadol Nausea And Vomiting    IV Location/Drains/Wounds Patient Lines/Drains/Airways Status   Active Line/Drains/Airways    Name:   Placement date:   Placement time:   Site:   Days:   Implanted Port   -    -    -      Urethral Catheter Dr. Junious Silk Latex 18 Fr.   12/28/16    1040    Latex   639   Ureteral Drain/Stent Right ureter 6 Fr.   12/28/16    1024    Right ureter   639   Incision (Closed) 12/28/16 Perineum Other (Comment)   12/28/16    1026     639          Labs/Imaging Results for orders placed or performed during the hospital  encounter of 09/27/18 (from the past 48 hour(s))  CBC with Differential/Platelet     Status: Abnormal   Collection Time: 09/27/18 11:53 PM  Result Value Ref Range   WBC 7.5 4.0 - 10.5 K/uL   RBC 4.42 3.87 - 5.11 MIL/uL   Hemoglobin 11.7 (L) 12.0 - 15.0 g/dL   HCT 35.4 (L) 36.0 - 46.0 %   MCV 80.1 78.0 - 100.0 fL   MCH 26.5 26.0 - 34.0 pg   MCHC 33.1 30.0 - 36.0 g/dL   RDW 13.2 11.5 - 15.5 %   Platelets 229 150 - 400 K/uL   Neutrophils Relative % 75 %   Neutro Abs 5.7 1.7 - 7.7 K/uL   Lymphocytes Relative 13 %   Lymphs Abs 1.0 0.7 - 4.0 K/uL   Monocytes Relative 8 %   Monocytes Absolute 0.6 0.1 - 1.0 K/uL   Eosinophils Relative 3 %   Eosinophils Absolute 0.2 0.0 - 0.7 K/uL   Basophils Relative 1 %   Basophils Absolute 0.0 0.0 - 0.1 K/uL    Comment: Performed at [name], [place] [address] [place],  [place] [address]  I-stat chem 8, ed     Status: Abnormal   Collection Time: 09/27/18 11:58 PM  Result Value Ref Range   Sodium 137 135 - 145 mmol/L   Potassium 2.9 (L) 3.5 - 5.1 mmol/L   Chloride 98 98 - 111 mmol/L   BUN 10 8 - 23 mg/dL   Creatinine, Ser 0.50 0.44 - 1.00 mg/dL   Glucose, Bld 108 (H) 70 - 99 mg/dL   Calcium, Ion 1.20 1.15 - 1.40 mmol/L   TCO2 24 22 - 32 mmol/L   Hemoglobin 11.6 (L) 12.0 - 15.0 g/dL   HCT 34.0 (L) 36.0 - 46.0 %   No results found.  Pending Labs Unresulted Labs (From admission, onward)    Start     Ordered   10/05/18 0500  Creatinine, serum  (enoxaparin (LOVENOX)    CrCl >/= 30 ml/min)  Weekly,   R    Comments:  while on enoxaparin therapy    09/28/18 0207   09/28/18 7353  Basic metabolic panel  Tomorrow morning,   R     09/28/18 0207   09/28/18 0500  Hepatic function panel  Tomorrow morning,   R     09/28/18 0207   09/28/18 0500  CBC WITH DIFFERENTIAL  Tomorrow morning,   R     09/28/18 0207   09/28/18 0206  CBC  (enoxaparin (LOVENOX)    CrCl >/= 30 ml/min)  Once,   R    Comments:  Baseline for enoxaparin therapy IF  NOT ALREADY DRAWN.  Notify MD if PLT < 100 K.    09/28/18 0207   09/28/18 0206  Creatinine, serum  (enoxaparin (LOVENOX)    CrCl >/= 30 ml/min)  Once,   R    Comments:  Baseline for enoxaparin therapy IF NOT ALREADY DRAWN.    09/28/18 0207   09/28/18 0137  Magnesium  STAT,   STAT     09/28/18 0137   09/27/18 2337  Urinalysis, Routine w reflex microscopic  Once,   R     09/27/18 2336          Vitals/Pain Today's Vitals   09/27/18 2357 09/28/18 0000 09/28/18 0030 09/28/18 0100  BP: (!) 172/87 (!) 164/88 (!) 170/87 (!) 165/96  Pulse: 84 89 87 85  Resp: 17  15 15   Temp:      TempSrc:      SpO2: 100% 98% 97% 97%  PainSc:        Isolation Precautions No active isolations  Medications Medications  potassium chloride 10 mEq in 100 mL IVPB (10 mEq Intravenous New Bag/Given 09/28/18 0202)  fentaNYL (DURAGESIC - dosed mcg/hr) patch 25 mcg (has no administration in time range)  amLODipine (NORVASC) tablet 10 mg (has no administration in time range)  metoprolol succinate (TOPROL-XL) 24 hr tablet 100 mg (has no administration in time range)  irbesartan (AVAPRO) tablet 300 mg (has no administration in time range)  rosuvastatin (CRESTOR) tablet 5 mg (has no administration in time range)  LORazepam (ATIVAN) tablet 0.5 mg (has no administration in time range)  pantoprazole (PROTONIX) EC tablet 40 mg (has no administration in time range)  ondansetron (ZOFRAN-ODT) disintegrating tablet 8 mg (has no administration in time range)  polyethylene glycol (MIRALAX / GLYCOLAX) packet 17 g (has no administration in time range)  senna-docusate (Senokot-S) tablet 1 tablet (has no administration in time range)  ferrous sulfate tablet 325 mg (has no administration in time range)  montelukast (SINGULAIR) tablet 10 mg (  has no administration in time range)  acetaminophen (TYLENOL) tablet 650 mg (has no administration in time range)    Or  acetaminophen (TYLENOL) suppository 650 mg (has no administration  in time range)  ondansetron (ZOFRAN) tablet 4 mg (has no administration in time range)    Or  ondansetron (ZOFRAN) injection 4 mg (has no administration in time range)  enoxaparin (LOVENOX) injection 40 mg (has no administration in time range)  0.9 %  sodium chloride infusion (has no administration in time range)  hydrALAZINE (APRESOLINE) injection 10 mg (has no administration in time range)  ondansetron (ZOFRAN) injection 4 mg (4 mg Intravenous Given 09/28/18 0202)    Mobility walks

## 2018-09-28 NOTE — ED Provider Notes (Signed)
[place] DEPT Provider Note   CSN: [id number] Arrival date & time: 09/27/18  2244     History   Chief Complaint Chief Complaint  Patient presents with  . Nausea  . Emesis    HPI KAJOL CRISPEN is a 67 y.o. female.  The history is provided by the patient.  Emesis   This is a new problem. The current episode started more than 2 days ago. The problem occurs 2 to 4 times per day. The problem has not changed since onset.The emesis has an appearance of stomach contents. There has been no fever. Pertinent negatives include no abdominal pain, no arthralgias, no chills, no cough, no diarrhea, no fever, no headaches, no myalgias, no sweats and no URI.    Past Medical History:  Diagnosis Date  . Anemia due to acute blood loss 11/24/2016   TRANSFUSED PRBC X2 UNITS 11-25-2016  . Bladder neoplasm   . Cancer ([place])   . Constipation   . Gross hematuria   . Hypertension   . Urgency of urination     Patient Active Problem List   Diagnosis Date Noted  . Port-A-Cath in place 09/21/2018  . Bone metastasis ([place]) 09/04/2018  . Hyponatremia 09/04/2018  . Hyperlipidemia 09/04/2018  . SIRS (systemic inflammatory response syndrome) ([place]) 09/03/2018  . Goals of care, counseling/discussion 08/18/2018  . Urothelial carcinoma of bladder ([place]) 07/28/2018  . Acute blood loss anemia 11/25/2016  . HTN (hypertension) 11/25/2016  . Gross hematuria 11/25/2016    Past Surgical History:  Procedure Laterality Date  . CYSTOSCOPY W/ URETERAL STENT PLACEMENT Right 12/28/2016   Procedure: CYSTOSCOPY WITH RIGHT RETROGRADE URETERAL STENT PLACEMENT;  Surgeon: Festus Aloe, MD;  Location: [facility];  Service: Urology;  Laterality: Right;  . IR IMAGING GUIDED PORT INSERTION  08/30/2018  . TRANSURETHRAL RESECTION OF BLADDER TUMOR WITH MITOMYCIN-C N/A 12/28/2016   Procedure: TRANSURETHRAL RESECTION OF BLADDER TUMOR GREATER THAN 5 CM;  Surgeon: Festus Aloe, MD;   Location: [facility];  Service: Urology;  Laterality: N/A;  . TUBAL LIGATION Bilateral 1990's     OB History   None      Home Medications    Prior to Admission medications   Medication Sig Start Date End Date Taking? Authorizing Provider  acetaminophen (TYLENOL) 325 MG tablet Take 650 mg by mouth every 6 (six) hours as needed for mild pain, moderate pain or headache.   Yes [provider]  amLODipine (NORVASC) 10 MG tablet Take 1 tablet (10 mg total) by mouth every morning. 03/02/17  Yes Robyn Haber, MD  Docusate Calcium (STOOL SOFTENER PO) Take 2 tablets by mouth daily as needed (constipation).   Yes [provider]  fentaNYL (DURAGESIC - DOSED MCG/HR) 25 MCG/HR patch Place 1 patch (25 mcg total) onto the skin every 3 (three) days. 09/09/18  Yes Shelly Coss, MD  ferrous sulfate 325 (65 FE) MG tablet Start taking only after constipation is relieved. Take 1 tablet once daily for 1 week, then 1 tablet twice daily for 1 week and then 1 tablet three times daily. Patient taking differently: Take 325 mg by mouth daily.  11/26/16  Yes Bonnielee Haff, MD  lidocaine-prilocaine (EMLA) cream Apply 1 application topically as needed. Patient taking differently: Apply 1 application topically as needed (port).  08/18/18  Yes Wyatt Portela, MD  LORazepam (ATIVAN) 0.5 MG tablet Take 1 tablet (0.5 mg total) by mouth every 4 (four) hours as needed for anxiety. 09/20/18  Yes Hayden Pedro, PA-C  metoprolol succinate (TOPROL-XL) 100 MG 24 hr tablet Take 100 mg by mouth daily. 07/04/18  Yes [provider]  montelukast (SINGULAIR) 10 MG tablet Take 10 mg by mouth daily. 07/14/18  Yes [provider]  olmesartan (BENICAR) 40 MG tablet Take 40 mg by mouth daily. 05/19/18  Yes [provider]  omeprazole (PRILOSEC) 40 MG capsule Take 40 mg by mouth daily. 07/03/18  Yes [provider]  ondansetron (ZOFRAN ODT) 8 MG disintegrating  tablet Take 1 tablet (8 mg total) by mouth every 8 (eight) hours as needed for nausea. 09/03/18  Yes Varney Biles, MD  oxyCODONE-acetaminophen (PERCOCET/ROXICET) 5-325 MG tablet Take 1 tablet by mouth every 6 (six) hours as needed (for breakthrough pain). 09/08/18  Yes Shelly Coss, MD  polyethylene glycol (MIRALAX / GLYCOLAX) packet Take 17 g by mouth every other day. 03/02/17  Yes Robyn Haber, MD  prochlorperazine (COMPAZINE) 10 MG tablet Take 1 tablet (10 mg total) by mouth every 6 (six) hours as needed for nausea or vomiting. 08/18/18  Yes Wyatt Portela, MD  rosuvastatin (CRESTOR) 5 MG tablet Take 5 mg by mouth daily. 07/05/18  Yes [provider]  senna-docusate (SENOKOT-S) 8.6-50 MG tablet Take 1 tablet by mouth daily as needed. Patient taking differently: Take 1 tablet by mouth daily as needed for mild constipation or moderate constipation.  03/02/17  Yes Robyn Haber, MD    Family History Family History  Problem Relation Age of Onset  . Leukemia Father     Social History Social History   Tobacco Use  . Smoking status: Never Smoker  . Smokeless tobacco: Never Used  Substance Use Topics  . Alcohol use: No  . Drug use: No     Allergies   Tramadol   Review of Systems Review of Systems  Constitutional: Negative for chills and fever.  Respiratory: Negative for cough.   Gastrointestinal: Positive for vomiting. Negative for abdominal pain and diarrhea.  Musculoskeletal: Negative for arthralgias and myalgias.  Neurological: Negative for headaches.  All other systems reviewed and are negative.    Physical Exam Updated Vital Signs BP (!) 165/96   Pulse 85   Temp 97.6 F (36.4 C) (Oral)   Resp 15   SpO2 97%   Physical Exam  Constitutional: She is oriented to person, place, and time. She appears well-developed and well-nourished. No distress.  HENT:  Head: Normocephalic and atraumatic.  Mouth/Throat: No oropharyngeal exudate.  Eyes: Pupils are  equal, round, and reactive to light. Conjunctivae are normal.  Neck: Normal range of motion. Neck supple.  Cardiovascular: Normal rate, regular rhythm, normal heart sounds and intact distal pulses.  Pulmonary/Chest: Effort normal and breath sounds normal. No stridor. She has no wheezes. She has no rales.  Abdominal: Soft. Bowel sounds are normal. She exhibits no mass. There is no tenderness. There is no rebound and no guarding.  Musculoskeletal: Normal range of motion.  Neurological: She is alert and oriented to person, place, and time. She displays normal reflexes. She exhibits normal muscle tone.  Skin: Skin is warm and dry. Capillary refill takes less than 2 seconds.  Psychiatric: She has a normal mood and affect.     ED Treatments / Results  Labs (all labs ordered are listed, but only abnormal results are displayed) Results for orders placed or performed during the hospital encounter of 09/27/18  CBC with Differential/Platelet  Result Value Ref Range   WBC 7.5 4.0 - 10.5 K/uL  RBC 4.42 3.87 - 5.11 MIL/uL   Hemoglobin 11.7 (L) 12.0 - 15.0 g/dL   HCT 35.4 (L) 36.0 - 46.0 %   MCV 80.1 78.0 - 100.0 fL   MCH 26.5 26.0 - 34.0 pg   MCHC 33.1 30.0 - 36.0 g/dL   RDW 13.2 11.5 - 15.5 %   Platelets 229 150 - 400 K/uL   Neutrophils Relative % 75 %   Neutro Abs 5.7 1.7 - 7.7 K/uL   Lymphocytes Relative 13 %   Lymphs Abs 1.0 0.7 - 4.0 K/uL   Monocytes Relative 8 %   Monocytes Absolute 0.6 0.1 - 1.0 K/uL   Eosinophils Relative 3 %   Eosinophils Absolute 0.2 0.0 - 0.7 K/uL   Basophils Relative 1 %   Basophils Absolute 0.0 0.0 - 0.1 K/uL  I-stat chem 8, ed  Result Value Ref Range   Sodium 137 135 - 145 mmol/L   Potassium 2.9 (L) 3.5 - 5.1 mmol/L   Chloride 98 98 - 111 mmol/L   BUN 10 8 - 23 mg/dL   Creatinine, Ser 0.50 0.44 - 1.00 mg/dL   Glucose, Bld 108 (H) 70 - 99 mg/dL   Calcium, Ion 1.20 1.15 - 1.40 mmol/L   TCO2 24 22 - 32 mmol/L   Hemoglobin 11.6 (L) 12.0 - 15.0 g/dL   HCT  34.0 (L) 36.0 - 46.0 %   Dg Chest 2 View  Result Date: 09/03/2018 CLINICAL DATA:  Nausea and weakness. Fever. Bladder cancer metastatic to the skeleton, lungs, and mediastinum. Recent initiation of radiation therapy 2 days ago. Chemotherapy starting 1 day ago. EXAM: CHEST - 2 VIEW COMPARISON:  Chest radiograph from 09/02/2018 FINDINGS: Power injectable right Port-A-Cath tip: SVC. Atherosclerotic calcification of the aortic arch. The patient is rotated to the right on today's radiograph, reducing diagnostic sensitivity and specificity. Bandlike density in the lingula likely from atelectasis or scarring, but this had a hypermetabolic component on the PET-CT suggesting at least a part of this is due to a metastatic nodule. The right lung appears clear although there is a known hypermetabolic pulmonary nodule medially at the right lung base in the retro diaphragmatic region which is not well seen by conventional radiography. Patient has a known sternal metastatic lesion, not well seen on today's chest radiograph. There is some minimal blunting of the left posterior costophrenic angle and possibly the right posterior costophrenic angle. Mild enlargement of the cardiopericardial silhouette, without edema. The patient has known extensive thoracic spine metastatic disease which is poorly apparent on today's chest radiographs. IMPRESSION: 1. Stable mild cardiomegaly, without edema. 2. Mild lingular atelectasis with a known underlying hypermetabolic pulmonary nodule in this vicinity. 3. Other nodules at the right lung base and bony involvement in the sternum and thoracic spine are not readily apparent on conventional radiography. 4. Blunting of the posterior costophrenic angle suggesting small pleural effusions. 5.  Aortic Atherosclerosis (ICD10-I70.0). Electronically Signed   By: Van Clines M.D.   On: 09/03/2018 16:16   Ct Abdomen Pelvis W Contrast  Result Date: 09/03/2018 CLINICAL DATA:  History of bladder  cancer. The patient is undergoing chemotherapy and radiation therapy. Nausea and vomiting. EXAM: CT ABDOMEN AND PELVIS WITH CONTRAST TECHNIQUE: Multidetector CT imaging of the abdomen and pelvis was performed using the standard protocol following bolus administration of intravenous contrast. CONTRAST:  166mL ISOVUE-300 IOPAMIDOL (ISOVUE-300) INJECTION 61% COMPARISON:  Body CT 07/21/2018 FINDINGS: Lower chest: Right lower lobe pulmonary nodule measures 1.5 cm, stable. Subpleural nodularity in the left  lower lobe, mainly 12 mm soft tissue nodule along the medial lower lobe pleura, and several soft tissue nodules along the inferolateral left lower lobe pleura. Small left pleural effusion. Small hiatal hernia. Hepatobiliary: Subtle 5 mm area of hypoattenuation in the inferior aspect of the right lobe of the liver, image 28/85, sequence 2. No gallstones, gallbladder wall thickening, or biliary dilatation. Pancreas: Unremarkable. No pancreatic ductal dilatation or surrounding inflammatory changes. Spleen: Normal in size without focal abnormality. Adrenals/Urinary Tract: Normal appearance of the adrenal glands. Benign-appearing 4.2 cm left renal cyst. 6 mm hypoattenuated nodule in the superior cortex of the right kidney, too small to be actually characterize. Stable appearance of the bladder. Stomach/Bowel: Stomach is within normal limits. No evidence of appendicitis. No evidence of bowel wall thickening, distention, or inflammatory changes. Vascular/Lymphatic: Aortic atherosclerosis. No enlarged abdominal or pelvic lymph nodes. Reproductive: Uterus and bilateral adnexa are unremarkable. Other: No abdominal wall hernia or abnormality. No abdominopelvic ascites. Musculoskeletal: Interval increase in the soft tissue component of the lytic lesions of the L2 spinous process and left lateral process with extension to the lamina and pedicle. New lytic lesion within the inferior aspect of T12 vertebral body. Increase lytic lesion  within the superior endplate of L1 vertebral body with interval development of compression deformity of the superior endplate. Enlarging lytic lesions within the anterior aspect of the L3 and L4 vertebral bodies. IMPRESSION: Worsening pulmonary metastatic disease. Mainly, interval development of suspicious subpleural nodules in the left lower lobe and lingula. Small left pleural effusion. Stable right lower lobe pulmonary nodule. Worsening skeletal metastatic disease. Interval increase in the soft tissue component of the lytic lesions of the L2 spinous process and left lateral process with extension to the lamina and pedicle. New lytic lesion within the inferior aspect of T12 vertebral body. Increase lytic lesion within the superior endplate of L1 vertebral body with interval development of compression fracture of the superior endplate. Enlarging lytic lesions within the anterior aspect of the L3 and L4 vertebral bodies. Subtle 5 mm area of hypoattenuation in the inferior aspect of right lobe of the liver, too small to be actually characterize. Small hiatal hernia. Electronically Signed   By: Fidela Salisbury M.D.   On: 09/03/2018 16:58   Dg Chest Port 1 View  Result Date: 09/02/2018 CLINICAL DATA:  Hypotension. EXAM: PORTABLE CHEST 1 VIEW COMPARISON:  07/17/2018 and prior radiographs.  07/24/2018 CT. FINDINGS: The cardiomediastinal silhouette is unchanged. A RIGHT IJ Port-A-Cath is noted with tip overlying the mid SVC. RIGHT basilar opacity/atelectasis/scarring again noted. No new pulmonary opacities are identified. No definite pleural effusion or pneumothorax. IMPRESSION: No acute abnormality. Electronically Signed   By: Margarette Canada M.D.   On: 09/02/2018 23:48   Ir Imaging Guided Port Insertion  Result Date: 09/01/2018 CLINICAL DATA:  Urothelial carcinoma of bladder, needs durable venous access for chemotherapy regimen EXAM: TUNNELED PORT CATHETER PLACEMENT WITH ULTRASOUND AND FLUOROSCOPIC GUIDANCE  FLUOROSCOPY TIME:  0.1 minute; 21 uGym2 DAP ANESTHESIA/SEDATION: Intravenous Fentanyl and Versed were administered as conscious sedation during continuous monitoring of the patient's level of consciousness and physiological / cardiorespiratory status by the radiology RN, with a total moderate sedation time of 13 minutes. TECHNIQUE: The procedure, risks, benefits, and alternatives were explained to the patient. Questions regarding the procedure were encouraged and answered. The patient understands and consents to the procedure. As antibiotic prophylaxis, cefazolin 2 g was ordered pre-procedure and administered intravenously within one hour of incision. Patency of the right IJ vein was confirmed  with ultrasound with image documentation. An appropriate skin site was determined. Skin site was marked. Region was prepped using maximum barrier technique including cap and mask, sterile gown, sterile gloves, large sterile sheet, and Chlorhexidine as cutaneous antisepsis. The region was infiltrated locally with 1% lidocaine. Under real-time ultrasound guidance, the right IJ vein was accessed with a 21 gauge micropuncture needle; the needle tip within the vein was confirmed with ultrasound image documentation. Needle was exchanged over a 018 guidewire for transitional dilator which allowed passage of the [facility] wire into the IVC. Over this, the transitional dilator was exchanged for a 5 [place] MPA catheter. A small incision was made on the right anterior chest wall and a subcutaneous pocket fashioned. The power-injectable port was positioned and its catheter tunneled to the right IJ dermatotomy site. The MPA catheter was exchanged over an Amplatz wire for a peel-away sheath, through which the port catheter, which had been trimmed to the appropriate length, was advanced and positioned under fluoroscopy with its tip at the cavoatrial junction. Spot chest radiograph confirms good catheter position and no pneumothorax. The pocket  was closed with deep interrupted and subcuticular continuous 3-0 Monocryl sutures. The port was flushed per protocol. The incisions were covered with Dermabond then covered with a sterile dressing. COMPLICATIONS: COMPLICATIONS None immediate IMPRESSION: Technically successful right IJ power-injectable port catheter placement. Ready for routine use. Electronically Signed   By: Lucrezia Europe M.D.   On: 09/01/2018 08:08    EKG None  Radiology No results found.  Procedures Procedures (including critical care time)  Medications Ordered in ED Medications  potassium chloride 10 mEq in 100 mL IVPB (has no administration in time range)  ondansetron (ZOFRAN) injection 4 mg (has no administration in time range)  0.9 %  sodium chloride infusion ( Intravenous New Bag/Given 09/28/18 0154)      Final Clinical Impressions(s) / ED Diagnoses   Final diagnoses:  Intractable vomiting with nausea, unspecified vomiting type  Hypokalemia    Will admit to medicine for intractable vomiting.     Earlean Fidalgo, MD 09/28/18 0202

## 2018-09-28 NOTE — Progress Notes (Signed)
Initial Nutrition Assessment  DOCUMENTATION CODES:   Not applicable  INTERVENTION:  - Continue diet advancement as medically feasible. - Continue to encourage PO intakes. - Will monitor for need for/acceptance of ONS at follow-up.    NUTRITION DIAGNOSIS:   Increased nutrient needs related to chronic illness, catabolic illness, cancer and cancer related treatments as evidenced by estimated needs.  GOAL:   Patient will meet greater than or equal to 90% of their needs  MONITOR:   PO intake, Weight trends, Labs  REASON FOR ASSESSMENT:   Malnutrition Screening Tool  ASSESSMENT:   67 y.o. female with history of metastatic bladder cancer who received chemotherapy in the first week of September following which patient was admitted for [facility] picture at the time patient has been having nausea vomiting and eventually was discharged to rehab when patient was discharged from rehab last Saturday 5 days ago.  Following day patient started developing nausea vomiting again with upper abdominal discomfort.  BMI indicates overweight status. No PO intakes documented since admission. Patient reports that she has not had anything to eat/drink today and is not feeling up to it at this time. She reports that she took medications ~10 minutes prior to RD visit and had not vomited after taking medications, which frequently happens for her.   She reports that she has had intermittent N/V over the past 1 month and that during these episodes (including current one) she is unable to keep anything down, including water. Patient states that abdominal discomfort and nausea has improved since admission.  Patient denies any pain with swallowing, no difficulties swallowing or chewing, and denies overt taste changes since starting chemo.   Per chart review, current weight is 158 lb and weight on 9/8 was 171 lb. This indicates 13 lb weight loss (7.6% body weight) in 1 month; this is significant for time frame.  Will monitor weight trends this admission though given current high rate IVF and likely dehydration on admission.    Medications reviewed; 325 mg ferrous sulfate/day, 4 mg IV Zofran x1 dose overnight, 1 packet Miralax every other day, 10 mEq IV KCl x5 runs today. Labs reviewed. IVF; NS @ 125 mL/hr.    NUTRITION - FOCUSED PHYSICAL EXAM:    Most Recent Value  Orbital Region  No depletion  Upper Arm Region  No depletion  Thoracic and Lumbar Region  No depletion  Buccal Region  No depletion  Temple Region  No depletion  Clavicle Bone Region  No depletion  Clavicle and Acromion Bone Region  No depletion  Scapular Bone Region  No depletion  Dorsal Hand  No depletion  Patellar Region  No depletion  Anterior Thigh Region  No depletion  Posterior Calf Region  No depletion  Edema (RD Assessment)  None  Hair  Reviewed  Eyes  Reviewed  Mouth  Reviewed  Skin  Reviewed  Nails  Reviewed       Diet Order:   Diet Order            Diet full liquid Room service appropriate? Yes; Fluid consistency: Thin  Diet effective now              EDUCATION NEEDS:   No education needs have been identified at this time  Skin:  Skin Assessment: Reviewed RN Assessment  Last BM:  PTA/unknown  Height:   Ht Readings from Last 1 Encounters:  09/28/18 5\' 2"  (1.575 m)    Weight:   Wt Readings from Last 1 Encounters:  09/28/18 71.5 kg    Ideal Body Weight:  50 kg  BMI:  Body mass index is 28.83 kg/m.  Estimated Nutritional Needs:   Kcal:  2145-2360 (30-33 kcal/kg)  Protein:  100-115 grams (1.4-1.6 grams/kg)  Fluid:  >/= 2.2 L/day     Jarome Matin, MS, RD, LDN, [facility] Inpatient Clinical Dietitian Pager # [phone] After hours/weekend pager # [phone]

## 2018-09-28 NOTE — Plan of Care (Signed)
  Problem: Pain Managment: Goal: General experience of comfort will improve Outcome: Progressing   Problem: Safety: Goal: Ability to remain free from injury will improve Outcome: Progressing   Problem: Elimination: Goal: Will not experience complications related to bowel motility Outcome: Progressing   

## 2018-09-28 NOTE — Progress Notes (Signed)
PROGRESS NOTE    QUANNA WITTKE   DDU:[id number]  DOB: [birth year]  DOA: 09/27/2018 PCP: Patient, No Pcp Per   Brief Narrative:  DARLEENE CUMPIAN is a 67 y.o. female with history of metastatic bladder cancer who received chemotherapy in the first week of September following which patient was admitted for [facility] picture at the time patient has been having nausea vomiting and eventually was discharged to rehab. The patient was discharged from rehab last Saturday 5 days ago.  Following day patient started developing nausea vomiting again with upper abdominal discomfort.  Denies any diarrhea has had normal bowel movement.  Denies any headache fever chills or any blood in the vomitus.   Subjective: Complains of intractable nausea with vomiting occurring on a daily basis. She has barely been able to eat or drink and is losing a great deal of weight.  ROS: no complaints of nausea, vomiting, constipation diarrhea, cough, dyspnea or dysuria. No other complaints.   Assessment & Plan:   Principal Problem:   Nausea & vomiting - intractable for almost 2 months now- no specific exacerbating factors and nausea is present on a near constant basis- PPI was started a few weeks ago but she does not feel it has helped - last CT did not show any issues with the gallbladder, liver, pancrease or stomach - head CT shows not metastasis  - LFTs normal - Lipase normal last month- not rechecked - have asked for a GI eval- d/w Dr Oletta Lamas - cont IVF  Active Problems:  Hypokalemia - likely due to vomiting- has been adequately replaced    HTN (hypertension) - cont Amlodipine, Irbesartan, Metoprolol    Urothelial carcinoma of bladder  - high grade with metastatic disease to lymph nodes, bones, lungs - s/p radiation to T and L spine 9/19 - Gemcitabine and cisplatin therapy started on 08/31/2018     DVT prophylaxis: lovenox Code Status: Full code Family Communication: sister in law Disposition Plan: cont to  follow oral intake and GI work up Consultants:   [facility] Procedures:   none Antimicrobials:  Anti-infectives (From admission, onward)   None       Objective: Vitals:   09/28/18 0200 09/28/18 0230 09/28/18 0231 09/28/18 0251  BP: (!) 158/87 (!) 162/85 (!) 162/85 (!) 180/90  Pulse: 85 80 79 83  Resp: 17 10 14 16   Temp:    98.4 F (36.9 C)  TempSrc:    Oral  SpO2: 98% 97% 96% 98%  Weight:    71.5 kg  Height:    5\' 2"  (1.575 m)    Intake/Output Summary (Last 24 hours) at 09/28/2018 0813 Last data filed at 09/28/2018 0656 Gross per 24 hour  Intake 968.22 ml  Output -  Net 968.22 ml   Filed Weights   09/28/18 0251  Weight: 71.5 kg    Examination: General exam: Appears comfortable  HEENT: PERRLA, oral mucosa moist, no sclera icterus or thrush Respiratory system: Clear to auscultation. Respiratory effort normal. Cardiovascular system: S1 & S2 heard, RRR.   Gastrointestinal system: Abdomen soft, non-tender, nondistended. Normal bowel sound. No organomegaly Central nervous system: Alert and oriented. No focal neurological deficits. Extremities: No cyanosis, clubbing or edema Skin: No rashes or ulcers Psychiatry:  Mood & affect appropriate.     Data Reviewed: I have personally reviewed following labs and imaging studies  CBC: Recent Labs  Lab 09/21/18 0905 09/27/18 2353 09/27/18 2358 09/28/18 0423  WBC 3.0* 7.5  --  8.1  NEUTROABS 1.6  5.7  --  6.2  HGB 10.9* 11.7* 11.6* 11.5*  HCT 33.5* 35.4* 34.0* 34.3*  MCV 81.5 80.1  --  79.8  PLT 400 229  --  371   Basic Metabolic Panel: Recent Labs  Lab 09/21/18 0905 09/27/18 2358 09/28/18 0156 09/28/18 0423  NA 139 137  --  140  K 3.5 2.9*  --  3.9  CL 101 98  --  104  CO2 26  --   --  23  GLUCOSE 90 108*  --  94  BUN 9 10  --  11  CREATININE 0.68 0.50  --  0.65  CALCIUM 9.6  --   --  9.4  MG  --   --  1.7  --    GFR: Estimated Creatinine Clearance: 65 mL/min (by C-G formula based on SCr of 0.65  mg/dL). Liver Function Tests: Recent Labs  Lab 09/21/18 0905 09/28/18 0423  AST 17 19  ALT 11 12  ALKPHOS 76 82  BILITOT 0.3 1.0  PROT 7.2 6.8  ALBUMIN 2.8* 3.0*   No results for input(s): LIPASE, AMYLASE in the last 168 hours. No results for input(s): AMMONIA in the last 168 hours. Coagulation Profile: No results for input(s): INR, PROTIME in the last 168 hours. Cardiac Enzymes: No results for input(s): CKTOTAL, CKMB, CKMBINDEX, TROPONINI in the last 168 hours. BNP (last 3 results) No results for input(s): PROBNP in the last 8760 hours. HbA1C: No results for input(s): HGBA1C in the last 72 hours. CBG: No results for input(s): GLUCAP in the last 168 hours. Lipid Profile: No results for input(s): CHOL, HDL, LDLCALC, TRIG, CHOLHDL, LDLDIRECT in the last 72 hours. Thyroid Function Tests: No results for input(s): TSH, T4TOTAL, FREET4, T3FREE, THYROIDAB in the last 72 hours. Anemia Panel: No results for input(s): VITAMINB12, FOLATE, FERRITIN, TIBC, IRON, RETICCTPCT in the last 72 hours. Urine analysis:    Component Value Date/Time   COLORURINE YELLOW 09/28/2018 0337   APPEARANCEUR CLOUDY (A) 09/28/2018 0337   LABSPEC 1.023 09/28/2018 0337   PHURINE 5.0 09/28/2018 0337   GLUCOSEU NEGATIVE 09/28/2018 0337   HGBUR MODERATE (A) 09/28/2018 0337   BILIRUBINUR NEGATIVE 09/28/2018 0337   KETONESUR 80 (A) 09/28/2018 0337   PROTEINUR 100 (A) 09/28/2018 0337   NITRITE NEGATIVE 09/28/2018 0337   LEUKOCYTESUR NEGATIVE 09/28/2018 0337   Sepsis Labs: @LABRCNTIP (procalcitonin:4,lacticidven:4) )No results found for this or any previous visit (from the past 240 hour(s)).       Radiology Studies: Dg Abd Acute W/chest  Result Date: 09/28/2018 CLINICAL DATA:  Nausea/vomiting, bladder cancer EXAM: DG ABDOMEN ACUTE W/ 1V CHEST COMPARISON:  Chest radiographs and CT abdomen/pelvis dated 09/03/2018 FINDINGS: Lingular scarring. Additional platelike scarring/atelectasis in the right lower  lung. No focal consolidation. No pleural effusion or pneumothorax. The heart is top-normal in size. Right chest power port terminates in the mid SVC. Nonobstructive bowel gas pattern. No evidence of free air on the lateral decubitus view. Mild degenerative changes the lower lumbar spine. IMPRESSION: No evidence of acute cardiopulmonary disease. No evidence of small bowel obstruction or free air. Electronically Signed   By: Julian Hy M.D.   On: 09/28/2018 02:35      Scheduled Meds: . amLODipine  10 mg Oral q morning - 10a  . enoxaparin (LOVENOX) injection  40 mg Subcutaneous Daily  . [START ON 09/30/2018] fentaNYL  25 mcg Transdermal Q72H  . ferrous sulfate  325 mg Oral Daily  . irbesartan  300 mg Oral Daily  . metoprolol  succinate  100 mg Oral Daily  . montelukast  10 mg Oral Daily  . pantoprazole  40 mg Oral Daily  . polyethylene glycol  17 g Oral QODAY  . rosuvastatin  5 mg Oral q1800   Continuous Infusions: . sodium chloride 125 mL/hr at 09/28/18 0236     LOS: 0 days    Time spent in minutes: 40    Debbe Odea, MD [facility] Pager: [email or web address] Password [email or web address] 09/28/2018, 8:13 AM

## 2018-09-28 NOTE — H&P (View-Only) (Signed)
[facility] CONSULT Reason for consult: Nausea and vomiting Referring Physician: [facility].  PCP: Dr. Jetty Duhamel is an 67 y.o. female.  HPI: She has had a history of invasive bladder cancer removed transurethrally about 2 years ago with no recurrence.  She began to have vague back pain went on for several weeks.  She states she was taken Advil Tylenol may be some other OTC medications initially.  Work-up by PCP revealed metastatic disease to the thoracic and lumbar spine.  She had a course of radiation therapy in September and had 1 dose of cisplatin and it apparently was stopped.  She has had severe complaints of nausea and vomiting with vague upper abdominal pain that actually preceded the discovery of the metastatic disease to her spine.  So she is very clear that this preceded the discovery of that matter in the spine and the initiation of chemotherapy.She has been on iron therapy Duragesic patch Lorazepam metoprolol and omeprazole.  She has been constipated been taking GlycoLax.  She has been on a multitude of medications for nausea none of which is helped.  She presented to the ER with continued nausea and vomiting and ketosis on urinalysis and electrolyte abnormalities and was admitted.She had previously been admitted several weeks ago and been discharged to rehab where she stayed for several days but throughout this whole time her nausea and vomiting has been intractable.  She notes that she eats feels full vomits up food that looks only partially digested and pills.  Does not feel like anything is helped.  She has had a head CT which did not show any metastatic disease.  CT showed no gallstones and liver look fine no obvious lesions.  No gross signs of delayed gastric emptying.  Past Medical History:  Diagnosis Date  . Anemia due to acute blood loss 11/24/2016   TRANSFUSED PRBC X2 UNITS 11-25-2016  . Bladder neoplasm   . Cancer ([place])   . Constipation   . Gross  hematuria   . Hypertension   . Urgency of urination     Past Surgical History:  Procedure Laterality Date  . CYSTOSCOPY W/ URETERAL STENT PLACEMENT Right 12/28/2016   Procedure: CYSTOSCOPY WITH RIGHT RETROGRADE URETERAL STENT PLACEMENT;  Surgeon: Festus Aloe, MD;  Location: [facility];  Service: Urology;  Laterality: Right;  . IR IMAGING GUIDED PORT INSERTION  08/30/2018  . TRANSURETHRAL RESECTION OF BLADDER TUMOR WITH MITOMYCIN-C N/A 12/28/2016   Procedure: TRANSURETHRAL RESECTION OF BLADDER TUMOR GREATER THAN 5 CM;  Surgeon: Festus Aloe, MD;  Location: [facility];  Service: Urology;  Laterality: N/A;  . TUBAL LIGATION Bilateral 1990's    Family History  Problem Relation Age of Onset  . Leukemia Father     Social History:  reports that she has never smoked. She has never used smokeless tobacco. She reports that she does not drink alcohol or use drugs.  Allergies:  Allergies  Allergen Reactions  . Tramadol Nausea And Vomiting    Medications; Prior to Admission medications   Medication Sig Start Date End Date Taking? Authorizing Provider  acetaminophen (TYLENOL) 325 MG tablet Take 650 mg by mouth every 6 (six) hours as needed for mild pain, moderate pain or headache.   Yes [provider]  amLODipine (NORVASC) 10 MG tablet Take 1 tablet (10 mg total) by mouth every morning. 03/02/17  Yes Robyn Haber, MD  Docusate Calcium (STOOL SOFTENER PO) Take 2 tablets by mouth daily as needed (  constipation).   Yes [provider]  fentaNYL (DURAGESIC - DOSED MCG/HR) 25 MCG/HR patch Place 1 patch (25 mcg total) onto the skin every 3 (three) days. 09/09/18  Yes Shelly Coss, MD  ferrous sulfate 325 (65 FE) MG tablet Start taking only after constipation is relieved. Take 1 tablet once daily for 1 week, then 1 tablet twice daily for 1 week and then 1 tablet three times daily. Patient taking differently: Take 325 mg by mouth daily.  11/26/16   Yes Bonnielee Haff, MD  lidocaine-prilocaine (EMLA) cream Apply 1 application topically as needed. Patient taking differently: Apply 1 application topically as needed (port).  08/18/18  Yes Wyatt Portela, MD  LORazepam (ATIVAN) 0.5 MG tablet Take 1 tablet (0.5 mg total) by mouth every 4 (four) hours as needed for anxiety. 09/20/18  Yes Hayden Pedro, PA-C  metoprolol succinate (TOPROL-XL) 100 MG 24 hr tablet Take 100 mg by mouth daily. 07/04/18  Yes [provider]  montelukast (SINGULAIR) 10 MG tablet Take 10 mg by mouth daily. 07/14/18  Yes [provider]  olmesartan (BENICAR) 40 MG tablet Take 40 mg by mouth daily. 05/19/18  Yes [provider]  omeprazole (PRILOSEC) 40 MG capsule Take 40 mg by mouth daily. 07/03/18  Yes [provider]  ondansetron (ZOFRAN ODT) 8 MG disintegrating tablet Take 1 tablet (8 mg total) by mouth every 8 (eight) hours as needed for nausea. 09/03/18  Yes Varney Biles, MD  oxyCODONE-acetaminophen (PERCOCET/ROXICET) 5-325 MG tablet Take 1 tablet by mouth every 6 (six) hours as needed (for breakthrough pain). 09/08/18  Yes Shelly Coss, MD  polyethylene glycol (MIRALAX / GLYCOLAX) packet Take 17 g by mouth every other day. 03/02/17  Yes Robyn Haber, MD  prochlorperazine (COMPAZINE) 10 MG tablet Take 1 tablet (10 mg total) by mouth every 6 (six) hours as needed for nausea or vomiting. 08/18/18  Yes Wyatt Portela, MD  rosuvastatin (CRESTOR) 5 MG tablet Take 5 mg by mouth daily. 07/05/18  Yes [provider]  senna-docusate (SENOKOT-S) 8.6-50 MG tablet Take 1 tablet by mouth daily as needed. Patient taking differently: Take 1 tablet by mouth daily as needed for mild constipation or moderate constipation.  03/02/17  Yes Robyn Haber, MD   . amLODipine  10 mg Oral q morning - 10a  . enoxaparin (LOVENOX) injection  40 mg Subcutaneous Daily  . [START ON 09/30/2018] fentaNYL  25 mcg Transdermal Q72H  . ferrous sulfate  325  mg Oral Daily  . irbesartan  300 mg Oral Daily  . metoprolol succinate  100 mg Oral Daily  . montelukast  10 mg Oral Daily  . pantoprazole  40 mg Oral Daily  . polyethylene glycol  17 g Oral QODAY  . rosuvastatin  5 mg Oral q1800   PRN Meds acetaminophen **OR** acetaminophen, hydrALAZINE, LORazepam, ondansetron **OR** ondansetron (ZOFRAN) IV, ondansetron, senna-docusate, sodium chloride flush Results for orders placed or performed during the hospital encounter of 09/27/18 (from the past 48 hour(s))  CBC with Differential/Platelet     Status: Abnormal   Collection Time: 09/27/18 11:53 PM  Result Value Ref Range   WBC 7.5 4.0 - 10.5 K/uL   RBC 4.42 3.87 - 5.11 MIL/uL   Hemoglobin 11.7 (L) 12.0 - 15.0 g/dL   HCT 35.4 (L) 36.0 - 46.0 %   MCV 80.1 78.0 - 100.0 fL   MCH 26.5 26.0 - 34.0 pg   MCHC 33.1 30.0 - 36.0 g/dL   RDW 13.2 11.5 -  15.5 %   Platelets 229 150 - 400 K/uL   Neutrophils Relative % 75 %   Neutro Abs 5.7 1.7 - 7.7 K/uL   Lymphocytes Relative 13 %   Lymphs Abs 1.0 0.7 - 4.0 K/uL   Monocytes Relative 8 %   Monocytes Absolute 0.6 0.1 - 1.0 K/uL   Eosinophils Relative 3 %   Eosinophils Absolute 0.2 0.0 - 0.7 K/uL   Basophils Relative 1 %   Basophils Absolute 0.0 0.0 - 0.1 K/uL    Comment: Performed at [facility], [place] [address] [place], [place] [address]  I-stat chem 8, ed     Status: Abnormal   Collection Time: 09/27/18 11:58 PM  Result Value Ref Range   Sodium 137 135 - 145 mmol/L   Potassium 2.9 (L) 3.5 - 5.1 mmol/L   Chloride 98 98 - 111 mmol/L   BUN 10 8 - 23 mg/dL   Creatinine, Ser 0.50 0.44 - 1.00 mg/dL   Glucose, Bld 108 (H) 70 - 99 mg/dL   Calcium, Ion 1.20 1.15 - 1.40 mmol/L   TCO2 24 22 - 32 mmol/L   Hemoglobin 11.6 (L) 12.0 - 15.0 g/dL   HCT 34.0 (L) 36.0 - 46.0 %  Magnesium     Status: None   Collection Time: 09/28/18  1:56 AM  Result Value Ref Range   Magnesium 1.7 1.7 - 2.4 mg/dL    Comment: Performed at [facility], [place] [address], [place], [place] [address]  Urinalysis, Routine w reflex microscopic     Status: Abnormal   Collection Time: 09/28/18  3:37 AM  Result Value Ref Range   Color, Urine YELLOW YELLOW   APPearance CLOUDY (A) CLEAR   Specific Gravity, Urine 1.023 1.005 - 1.030   pH 5.0 5.0 - 8.0   Glucose, UA NEGATIVE NEGATIVE mg/dL   Hgb urine dipstick MODERATE (A) NEGATIVE   Bilirubin Urine NEGATIVE NEGATIVE   Ketones, ur 80 (A) NEGATIVE mg/dL   Protein, ur 100 (A) NEGATIVE mg/dL   Nitrite NEGATIVE NEGATIVE   Leukocytes, UA NEGATIVE NEGATIVE   RBC / HPF 0-5 0 - 5 RBC/hpf   WBC, UA 6-10 0 - 5 WBC/hpf   Bacteria, UA MANY (A) NONE SEEN   Squamous Epithelial / LPF 21-50 0 - 5   Mucus PRESENT     Comment: Performed at [facility], [place] [address], [place], [place] [address]  Basic metabolic panel     Status: None   Collection Time: 09/28/18  4:23 AM  Result Value Ref Range   Sodium 140 135 - 145 mmol/L   Potassium 3.9 3.5 - 5.1 mmol/L    Comment: DELTA CHECK NOTED REPEATED TO VERIFY NO VISIBLE HEMOLYSIS    Chloride 104 98 - 111 mmol/L   CO2 23 22 - 32 mmol/L   Glucose, Bld 94 70 - 99 mg/dL   BUN 11 8 - 23 mg/dL   Creatinine, Ser 0.65 0.44 - 1.00 mg/dL   Calcium 9.4 8.9 - 10.3 mg/dL   GFR calc non Af Amer >60 >60 mL/min   GFR calc Af Amer >60 >60 mL/min    Comment: (NOTE) The eGFR has been calculated using the CKD EPI equation. This calculation has not been validated in all clinical situations. eGFR's persistently <60 mL/min signify possible Chronic Kidney Disease.    Anion gap 13 5 - 15    Comment: Performed at [facility], [place] [address] [place],  [place] [address]  Hepatic function panel     Status: Abnormal   Collection Time: 09/28/18  4:23 AM  Result Value Ref Range   Total Protein 6.8 6.5 - 8.1 g/dL   Albumin 3.0 (L) 3.5 - 5.0 g/dL   AST 19 15 - 41 U/L   ALT 12 0 - 44 U/L   Alkaline Phosphatase 82 38 - 126  U/L   Total Bilirubin 1.0 0.3 - 1.2 mg/dL   Bilirubin, Direct 0.1 0.0 - 0.2 mg/dL   Indirect Bilirubin 0.9 0.3 - 0.9 mg/dL    Comment: Performed at [facility], [place] [address], [place], [place] [address]  CBC WITH DIFFERENTIAL     Status: Abnormal   Collection Time: 09/28/18  4:23 AM  Result Value Ref Range   WBC 8.1 4.0 - 10.5 K/uL   RBC 4.30 3.87 - 5.11 MIL/uL   Hemoglobin 11.5 (L) 12.0 - 15.0 g/dL   HCT 34.3 (L) 36.0 - 46.0 %   MCV 79.8 78.0 - 100.0 fL   MCH 26.7 26.0 - 34.0 pg   MCHC 33.5 30.0 - 36.0 g/dL   RDW 13.3 11.5 - 15.5 %   Platelets 212 150 - 400 K/uL   Neutrophils Relative % 77 %   Neutro Abs 6.2 1.7 - 7.7 K/uL   Lymphocytes Relative 15 %   Lymphs Abs 1.2 0.7 - 4.0 K/uL   Monocytes Relative 6 %   Monocytes Absolute 0.5 0.1 - 1.0 K/uL   Eosinophils Relative 2 %   Eosinophils Absolute 0.2 0.0 - 0.7 K/uL   Basophils Relative 0 %   Basophils Absolute 0.0 0.0 - 0.1 K/uL    Comment: Performed at [facility], [place] [address], [place], [place] [address]    Ct Head Wo Contrast  Result Date: 09/28/2018 CLINICAL DATA:  Nausea, vomiting. EXAM: CT HEAD WITHOUT CONTRAST TECHNIQUE: Contiguous axial images were obtained from the base of the skull through the vertex without intravenous contrast. COMPARISON:  None. FINDINGS: Brain: No evidence of acute infarction, hemorrhage, hydrocephalus, extra-axial collection or mass lesion/mass effect. Vascular: No hyperdense vessel or unexpected calcification. Skull: Normal. Negative for fracture or focal lesion. Sinuses/Orbits: No acute finding. Other: None. IMPRESSION: Normal head CT. Electronically Signed   By: Marijo Conception, M.D.   On: 09/28/2018 09:07   Dg Abd Acute W/chest  Result Date: 09/28/2018 CLINICAL DATA:  Nausea/vomiting, bladder cancer EXAM: DG ABDOMEN ACUTE W/ 1V CHEST COMPARISON:  Chest radiographs and CT abdomen/pelvis dated 09/03/2018 FINDINGS: Lingular scarring. Additional platelike  scarring/atelectasis in the right lower lung. No focal consolidation. No pleural effusion or pneumothorax. The heart is top-normal in size. Right chest power port terminates in the mid SVC. Nonobstructive bowel gas pattern. No evidence of free air on the lateral decubitus view. Mild degenerative changes the lower lumbar spine. IMPRESSION: No evidence of acute cardiopulmonary disease. No evidence of small bowel obstruction or free air. Electronically Signed   By: Julian Hy M.D.   On: 09/28/2018 02:35               Blood pressure 135/75, pulse 85, temperature 98.4 F (36.9 C), temperature source Oral, resp. rate 16, height 5' 2"  (1.575 m), weight 71.5 kg, SpO2 98 %.  Physical exam:   General--Pleasant African-American female appears comfortable in no distress ENT--nonicteric Neck--supple Heart--regular rate and rhythm without murmurs or gallops Lungs--clear Abdomen--nondistended with possibly mild upper abdominal tenderness questionable right greater than left no masses palpated Psych--her mood  and affect are appropriate alert and oriented  Assessment: 1.  Nausea and vomiting.  This appears to be somewhat intractable.  Patient's very clear that her symptoms began before she received chemotherapy and really before she even knew that she had recurrent disease.  She was taken some Advil and other medications for her back pain prior to the diagnosis it is possible she could have developed an ulcer.  Biliary tract disease is also possible she has not had an ultrasound. 2.  Bladder cancer.  This was treated 2 years ago and she is had no recurrence up until now.  Is now metastatic to the lumbar sacral and thoracic spine and she is been having back pain.  Plan: Agree with treating her nausea and vomiting symptomatically.  She does have some exposure to NSAIDs and with the vomiting we could consider an outlet obstruction.  I think we need to go ahead with EGD and ultrasound of the  gallbladder as well.   Nancy Fetter 09/28/2018, 4:23 PM   This note was created using voice recognition software and minor errors may Have occurred unintentionally. Pager: [phone] If no answer or after hours call [phone]

## 2018-09-29 ENCOUNTER — Observation Stay (HOSPITAL_COMMUNITY): Payer: Medicare Other | Admitting: Anesthesiology

## 2018-09-29 ENCOUNTER — Inpatient Hospital Stay: Payer: Medicare Other

## 2018-09-29 ENCOUNTER — Encounter (HOSPITAL_COMMUNITY): Payer: Self-pay

## 2018-09-29 ENCOUNTER — Encounter (HOSPITAL_COMMUNITY)
Admission: EM | Disposition: A | Discharge status: Home / Self Care | Payer: Self-pay | Source: Home / Self Care | Attending: Internal Medicine

## 2018-09-29 ENCOUNTER — Inpatient Hospital Stay: Payer: Medicare Other | Admitting: Oncology

## 2018-09-29 DIAGNOSIS — C679 Malignant neoplasm of bladder, unspecified: Secondary | ICD-10-CM | POA: Diagnosis present

## 2018-09-29 DIAGNOSIS — Z923 Personal history of irradiation: Secondary | ICD-10-CM | POA: Diagnosis not present

## 2018-09-29 DIAGNOSIS — C7989 Secondary malignant neoplasm of other specified sites: Secondary | ICD-10-CM | POA: Diagnosis present

## 2018-09-29 DIAGNOSIS — C7800 Secondary malignant neoplasm of unspecified lung: Secondary | ICD-10-CM | POA: Diagnosis present

## 2018-09-29 DIAGNOSIS — Z960 Presence of urogenital implants: Secondary | ICD-10-CM | POA: Diagnosis present

## 2018-09-29 DIAGNOSIS — I10 Essential (primary) hypertension: Secondary | ICD-10-CM | POA: Diagnosis not present

## 2018-09-29 DIAGNOSIS — E876 Hypokalemia: Secondary | ICD-10-CM | POA: Diagnosis present

## 2018-09-29 DIAGNOSIS — K259 Gastric ulcer, unspecified as acute or chronic, without hemorrhage or perforation: Secondary | ICD-10-CM | POA: Diagnosis not present

## 2018-09-29 DIAGNOSIS — K449 Diaphragmatic hernia without obstruction or gangrene: Secondary | ICD-10-CM | POA: Diagnosis not present

## 2018-09-29 DIAGNOSIS — R112 Nausea with vomiting, unspecified: Secondary | ICD-10-CM | POA: Diagnosis not present

## 2018-09-29 DIAGNOSIS — C779 Secondary and unspecified malignant neoplasm of lymph node, unspecified: Secondary | ICD-10-CM | POA: Diagnosis present

## 2018-09-29 DIAGNOSIS — K5900 Constipation, unspecified: Secondary | ICD-10-CM | POA: Diagnosis present

## 2018-09-29 DIAGNOSIS — Z79899 Other long term (current) drug therapy: Secondary | ICD-10-CM | POA: Diagnosis not present

## 2018-09-29 DIAGNOSIS — C7951 Secondary malignant neoplasm of bone: Secondary | ICD-10-CM | POA: Diagnosis not present

## 2018-09-29 DIAGNOSIS — E8889 Other specified metabolic disorders: Secondary | ICD-10-CM | POA: Diagnosis present

## 2018-09-29 DIAGNOSIS — Z885 Allergy status to narcotic agent status: Secondary | ICD-10-CM | POA: Diagnosis not present

## 2018-09-29 DIAGNOSIS — Z95828 Presence of other vascular implants and grafts: Secondary | ICD-10-CM | POA: Diagnosis not present

## 2018-09-29 DIAGNOSIS — R1114 Bilious vomiting: Secondary | ICD-10-CM | POA: Diagnosis not present

## 2018-09-29 DIAGNOSIS — Z79891 Long term (current) use of opiate analgesic: Secondary | ICD-10-CM | POA: Diagnosis not present

## 2018-09-29 DIAGNOSIS — D630 Anemia in neoplastic disease: Secondary | ICD-10-CM | POA: Diagnosis present

## 2018-09-29 HISTORY — PX: ESOPHAGOGASTRODUODENOSCOPY (EGD) WITH PROPOFOL: SHX5813

## 2018-09-29 HISTORY — PX: BIOPSY: SHX5522

## 2018-09-29 LAB — BASIC METABOLIC PANEL
ANION GAP: 10 (ref 5–15)
BUN: 6 mg/dL — AB (ref 8–23)
CALCIUM: 8.9 mg/dL (ref 8.9–10.3)
CO2: 24 mmol/L (ref 22–32)
CREATININE: 0.56 mg/dL (ref 0.44–1.00)
Chloride: 104 mmol/L (ref 98–111)
GFR calc non Af Amer: >60 mL/min (ref >60)
Glucose, Bld: 81 mg/dL (ref 70–99)
POTASSIUM: 3.8 mmol/L (ref 3.5–5.1)
SODIUM: 138 mmol/L (ref 135–145)

## 2018-09-29 SURGERY — ESOPHAGOGASTRODUODENOSCOPY (EGD) WITH PROPOFOL
Anesthesia: Monitor Anesthesia Care

## 2018-09-29 MED ORDER — PHENOL 1.4 % MT LIQD
1.0000 | OROMUCOSAL | Status: DC | PRN
  Filled 2018-09-29 (×2): qty 177

## 2018-09-29 MED ORDER — PROPOFOL 10 MG/ML IV BOLUS
INTRAVENOUS | Status: AC
  Filled 2018-09-29: qty 40

## 2018-09-29 MED ORDER — PROPOFOL 10 MG/ML IV BOLUS
INTRAVENOUS | Status: DC | PRN
  Administered 2018-09-29: 20 mg via INTRAVENOUS
  Administered 2018-09-29 (×2): 10 mg via INTRAVENOUS

## 2018-09-29 MED ORDER — PROPOFOL 500 MG/50ML IV EMUL
INTRAVENOUS | Status: DC | PRN
  Administered 2018-09-29: 100 ug/kg/min via INTRAVENOUS

## 2018-09-29 MED ORDER — PANTOPRAZOLE SODIUM 40 MG PO TBEC
40.0000 mg | DELAYED_RELEASE_TABLET | Freq: Two times a day (BID) | ORAL | Status: DC
  Administered 2018-09-29 – 2018-10-02 (×6): 40 mg via ORAL
  Filled 2018-09-29 (×6): qty 1

## 2018-09-29 SURGICAL SUPPLY — 15 items

## 2018-09-29 NOTE — Transfer of Care (Signed)
Immediate Anesthesia Transfer of Care Note  Patient: Faith [name]  Procedure(s) Performed: ESOPHAGOGASTRODUODENOSCOPY (EGD) WITH PROPOFOL (N/A ) BIOPSY  Patient Location: PACU  Anesthesia Type:MAC  Level of Consciousness: sedated  Airway & Oxygen Therapy: Patient Spontanous Breathing and Patient connected to nasal cannula oxygen  Post-op Assessment: Report given to RN and Post -op Vital signs reviewed and stable  Post vital signs: Reviewed and stable  Last Vitals:  Vitals Value Taken Time  BP    Temp    Pulse    Resp 12 09/29/2018 11:00 AM  SpO2    Vitals shown include unvalidated device data.  Last Pain:  Vitals:   09/29/18 1011  TempSrc: Oral  PainSc: 0-No pain         Complications: No apparent anesthesia complications

## 2018-09-29 NOTE — Interval H&P Note (Signed)
History and Physical Interval Note:  09/29/2018 10:24 AM  Faith [name]  has presented today for surgery, with the diagnosis of N+V  The various methods of treatment have been discussed with the patient and family. After consideration of risks, benefits and other options for treatment, the patient has consented to  Procedure(s): ESOPHAGOGASTRODUODENOSCOPY (EGD) WITH PROPOFOL (N/A) as a surgical intervention .  The patient's history has been reviewed, patient examined, no change in status, stable for surgery.  I have reviewed the patient's chart and labs.  Questions were answered to the patient's satisfaction.     Nancy Fetter

## 2018-09-29 NOTE — Progress Notes (Signed)
PROGRESS NOTE    Faith [name]   OZY:[id number]  DOB: 10/07/1951  DOA: 09/27/2018 PCP: Patient, No Pcp Per   Brief Narrative:  Faith [name] is a 67 y.o. female with history of metastatic bladder cancer who received chemotherapy in the first week of September following which patient was admitted for [facility] picture at the time patient has been having nausea vomiting and eventually was discharged to rehab. The patient was discharged from rehab last Saturday 5 days ago.  Following day patient started developing nausea vomiting again with upper abdominal discomfort.  Denies any diarrhea has had normal bowel movement.  Denies any headache fever chills or any blood in the vomitus.   Subjective: Ongoing mild nausea but has stopped vomiting.   Assessment & Plan:   Principal Problem:   Nausea & vomiting - intractable for almost 2 months now- no specific exacerbating factors and nausea is present on a near constant basis- PPI was started a few weeks ago but she does not feel it has helped - last CT did not show any issues with the gallbladder, liver, pancrease or stomach - head CT shows not metastasis  - LFTs normal - Lipase normal last month- not rechecked - have asked for a GI eval- d/w Dr Oletta Lamas - cont IVF - EGD >>Non-bleeding gastric ulcers with no stigmata of bleeding - US abdomen> Gallbladder sludge without cholelithiasis or acute cholecystitis..  - will slowly start clears today to see how she tolerates them - PPI should be increased to BID- may need to add Carafate if she is still symptomatic - cont IVF until able to take POs appropriately  Active Problems:  Hypokalemia - likely due to vomiting- being replaced in IVF    HTN (hypertension) - cont Amlodipine, Irbesartan, Metoprolol    Urothelial carcinoma of bladder  - high grade with metastatic disease to lymph nodes, bones, lungs - s/p radiation to T and L spine 9/19 - Gemcitabine and cisplatin therapy started on  08/31/2018     DVT prophylaxis: lovenox Code Status: Full code Family Communication: sister in law Disposition Plan: cont to follow oral intake and GI work up Consultants:   [facility] Procedures:   EGD 10/4 Antimicrobials:  Anti-infectives (From admission, onward)   None       Objective: Vitals:   09/29/18 1011 09/29/18 1100 09/29/18 1110 09/29/18 1131  BP: (!) 183/80 (!) 158/71 (!) 167/75 (!) 152/69  Pulse: 90 80 74 68  Resp: 13 12 15 19   Temp: 97.8 F (36.6 C) 98.3 F (36.8 C)  98 F (36.7 C)  TempSrc: Oral Oral  Oral  SpO2: 97% 97% 100% 96%  Weight:      Height:        Intake/Output Summary (Last 24 hours) at 09/29/2018 1415 Last data filed at 09/29/2018 1054 Gross per 24 hour  Intake 3035.81 ml  Output -  Net 3035.81 ml   Filed Weights   09/28/18 0251  Weight: 71.5 kg    Examination: General exam: Appears comfortable  HEENT: PERRLA, oral mucosa moist, no sclera icterus or thrush Respiratory system: Clear to auscultation. Respiratory effort normal. Cardiovascular system: S1 & S2 heard, RRR.   Gastrointestinal system: Abdomen soft,  Tender across RUQ and epigastrium, nondistended. Normal bowel sound. No organomegaly Central nervous system: Alert and oriented. No focal neurological deficits. Extremities: No cyanosis, clubbing or edema Skin: No rashes or ulcers Psychiatry:  Mood & affect appropriate.     Data Reviewed: I have personally reviewed  following labs and imaging studies  CBC: Recent Labs  Lab 09/27/18 2353 09/27/18 2358 09/28/18 0423  WBC 7.5  --  8.1  NEUTROABS 5.7  --  6.2  HGB 11.7* 11.6* 11.5*  HCT 35.4* 34.0* 34.3*  MCV 80.1  --  79.8  PLT 229  --  937   Basic Metabolic Panel: Recent Labs  Lab 09/27/18 2358 09/28/18 0156 09/28/18 0423 09/29/18 0323  NA 137  --  140 138  K 2.9*  --  3.9 3.8  CL 98  --  104 104  CO2  --   --  23 24  GLUCOSE 108*  --  94 81  BUN 10  --  11 6*  CREATININE 0.50  --  0.65 0.56  CALCIUM  --    --  9.4 8.9  MG  --  1.7  --   --    GFR: Estimated Creatinine Clearance: 65 mL/min (by C-G formula based on SCr of 0.56 mg/dL). Liver Function Tests: Recent Labs  Lab 09/28/18 0423  AST 19  ALT 12  ALKPHOS 82  BILITOT 1.0  PROT 6.8  ALBUMIN 3.0*   No results for input(s): LIPASE, AMYLASE in the last 168 hours. No results for input(s): AMMONIA in the last 168 hours. Coagulation Profile: No results for input(s): INR, PROTIME in the last 168 hours. Cardiac Enzymes: No results for input(s): CKTOTAL, CKMB, CKMBINDEX, TROPONINI in the last 168 hours. BNP (last 3 results) No results for input(s): PROBNP in the last 8760 hours. HbA1C: No results for input(s): HGBA1C in the last 72 hours. CBG: No results for input(s): GLUCAP in the last 168 hours. Lipid Profile: No results for input(s): CHOL, HDL, LDLCALC, TRIG, CHOLHDL, LDLDIRECT in the last 72 hours. Thyroid Function Tests: No results for input(s): TSH, T4TOTAL, FREET4, T3FREE, THYROIDAB in the last 72 hours. Anemia Panel: No results for input(s): VITAMINB12, FOLATE, FERRITIN, TIBC, IRON, RETICCTPCT in the last 72 hours. Urine analysis:    Component Value Date/Time   COLORURINE YELLOW 09/28/2018 0337   APPEARANCEUR CLOUDY (A) 09/28/2018 0337   LABSPEC 1.023 09/28/2018 0337   PHURINE 5.0 09/28/2018 0337   GLUCOSEU NEGATIVE 09/28/2018 0337   HGBUR MODERATE (A) 09/28/2018 0337   BILIRUBINUR NEGATIVE 09/28/2018 0337   KETONESUR 80 (A) 09/28/2018 0337   PROTEINUR 100 (A) 09/28/2018 0337   NITRITE NEGATIVE 09/28/2018 0337   LEUKOCYTESUR NEGATIVE 09/28/2018 0337   Sepsis Labs: @LABRCNTIP (procalcitonin:4,lacticidven:4) )No results found for this or any previous visit (from the past 240 hour(s)).       Radiology Studies: Ct Head Wo Contrast  Result Date: 09/28/2018 CLINICAL DATA:  Nausea, vomiting. EXAM: CT HEAD WITHOUT CONTRAST TECHNIQUE: Contiguous axial images were obtained from the base of the skull through the  vertex without intravenous contrast. COMPARISON:  None. FINDINGS: Brain: No evidence of acute infarction, hemorrhage, hydrocephalus, extra-axial collection or mass lesion/mass effect. Vascular: No hyperdense vessel or unexpected calcification. Skull: Normal. Negative for fracture or focal lesion. Sinuses/Orbits: No acute finding. Other: None. IMPRESSION: Normal head CT. Electronically Signed   By: Marijo Conception, M.D.   On: 09/28/2018 09:07   US Abdomen Limited  Result Date: 09/28/2018 CLINICAL DATA:  67 year old female with acute RIGHT UPPER quadrant abdominal pain with nausea and vomiting. EXAM: ULTRASOUND ABDOMEN LIMITED RIGHT UPPER QUADRANT COMPARISON:  None. FINDINGS: Gallbladder: Gallbladder sludge identified. There is no evidence of cholelithiasis or acute cholecystitis. Common bile duct: Diameter: 6 mm.  No intrahepatic or extrahepatic biliary dilatation. Liver: No focal  lesion identified. Within normal limits in parenchymal echogenicity. Portal vein is patent on color Doppler imaging with normal direction of blood flow towards the liver. IMPRESSION: 1. Gallbladder sludge without cholelithiasis or acute cholecystitis. 2. No other significant abnormalities.  No biliary dilatation. Electronically Signed   By: Margarette Canada M.D.   On: 09/28/2018 18:09   Dg Abd Acute W/chest  Result Date: 09/28/2018 CLINICAL DATA:  Nausea/vomiting, bladder cancer EXAM: DG ABDOMEN ACUTE W/ 1V CHEST COMPARISON:  Chest radiographs and CT abdomen/pelvis dated 09/03/2018 FINDINGS: Lingular scarring. Additional platelike scarring/atelectasis in the right lower lung. No focal consolidation. No pleural effusion or pneumothorax. The heart is top-normal in size. Right chest power port terminates in the mid SVC. Nonobstructive bowel gas pattern. No evidence of free air on the lateral decubitus view. Mild degenerative changes the lower lumbar spine. IMPRESSION: No evidence of acute cardiopulmonary disease. No evidence of small bowel  obstruction or free air. Electronically Signed   By: Julian Hy M.D.   On: 09/28/2018 02:35      Scheduled Meds: . amLODipine  10 mg Oral q morning - 10a  . enoxaparin (LOVENOX) injection  40 mg Subcutaneous Daily  . [START ON 09/30/2018] fentaNYL  25 mcg Transdermal Q72H  . ferrous sulfate  325 mg Oral Daily  . irbesartan  300 mg Oral Daily  . metoprolol succinate  100 mg Oral Daily  . montelukast  10 mg Oral Daily  . pantoprazole  40 mg Oral BID AC  . polyethylene glycol  17 g Oral QODAY  . rosuvastatin  5 mg Oral q1800   Continuous Infusions: . 0.9 % NaCl with KCl 20 mEq / L 125 mL/hr at 09/29/18 0914     LOS: 0 days    Time spent in minutes: 40    Debbe Odea, MD [facility] Pager: [email or web address] Password [email or web address] 09/29/2018, 2:15 PM

## 2018-09-29 NOTE — Anesthesia Postprocedure Evaluation (Signed)
Anesthesia Post Note  Patient: Faith [name]  Procedure(s) Performed: ESOPHAGOGASTRODUODENOSCOPY (EGD) WITH PROPOFOL (N/A ) BIOPSY     Patient location during evaluation: PACU Anesthesia Type: MAC Level of consciousness: awake and alert, awake and oriented Pain management: pain level controlled Vital Signs Assessment: post-procedure vital signs reviewed and stable Respiratory status: spontaneous breathing, nonlabored ventilation and respiratory function stable Cardiovascular status: stable and blood pressure returned to baseline Postop Assessment: no apparent nausea or vomiting Anesthetic complications: no    Last Vitals:  Vitals:   09/29/18 1100 09/29/18 1110  BP: (!) 158/71 (!) 167/75  Pulse: 80 74  Resp: 12 15  Temp: 36.8 C   SpO2: 97% 100%    Last Pain:  Vitals:   09/29/18 1110  TempSrc:   PainSc: (P) 0-No pain                 Catalina Gravel

## 2018-09-29 NOTE — Progress Notes (Signed)
Pt transported to endo via stretcher.

## 2018-09-29 NOTE — Anesthesia Preprocedure Evaluation (Signed)
Anesthesia Evaluation  Patient identified by MRN, date of birth, ID band Patient awake    Reviewed: Allergy & Precautions, NPO status , Patient's Chart, lab work & pertinent test results, reviewed documented beta blocker date and time   Airway Mallampati: II  TM Distance: >3 FB Neck ROM: Full    Dental  (+) Teeth Intact, Dental Advisory Given, Poor Dentition, Missing, Chipped   Pulmonary neg pulmonary ROS,    Pulmonary exam normal breath sounds clear to auscultation       Cardiovascular hypertension, Pt. on medications and Pt. on home beta blockers Normal cardiovascular exam Rhythm:Regular Rate:Normal     Neuro/Psych negative neurological ROS     GI/Hepatic Neg liver ROS, GERD  Medicated,Nausea/vomiting   Endo/Other  negative endocrine ROS  Renal/GU negative Renal ROS   Urothelial carcinoma of bladder  - high grade with metastatic disease to lymph nodes, bones, lungs - s/p radiation to T and L spine 9/19 - Gemcitabine and cisplatin therapy started on 08/31/2018    Musculoskeletal negative musculoskeletal ROS (+)   Abdominal   Peds  Hematology  (+) Blood dyscrasia, anemia ,   Anesthesia Other Findings Day of surgery medications reviewed with the patient.  Reproductive/Obstetrics                             Anesthesia Physical Anesthesia Plan  ASA: IV  Anesthesia Plan: MAC   Post-op Pain Management:    Induction: Intravenous  PONV Risk Score and Plan: 2 and Propofol infusion and Treatment may vary due to age or medical condition  Airway Management Planned: Natural Airway and Simple Face Mask  Additional Equipment:   Intra-op Plan:   Post-operative Plan:   Informed Consent: I have reviewed the patients History and Physical, chart, labs and discussed the procedure including the risks, benefits and alternatives for the proposed anesthesia with the patient or authorized  representative who has indicated his/her understanding and acceptance.   Dental advisory given  Plan Discussed with: CRNA and Anesthesiologist  Anesthesia Plan Comments:         Anesthesia Quick Evaluation

## 2018-09-29 NOTE — Op Note (Signed)
[facility] Patient Name: Faith [name] Procedure Date: 09/29/2018 MRN: [id number] Attending MD: Nancy Fetter Dr., MD Date of Birth: June 04, 1951 CSN: [id number] Age: 67 Admit Type: Inpatient Procedure:                Upper GI endoscopy with biopsy Indications:              Nausea with vomiting, preceded radiation and chemo Providers:                Jeneen Rinks L. Ski Polich Dr., MD, Cleda Daub, RN, Elspeth Cho Tech., Technician, [place]st Alday CRNA,                            CRNA Referring MD:              Medicines:                Monitored Anesthesia Care Complications:            No immediate complications. Estimated Blood Loss:     Estimated blood loss: none. Procedure:                Pre-Anesthesia Assessment:                           - Prior to the procedure, a History and Physical                            was performed, and patient medications and                            allergies were reviewed. The patient's tolerance of                            previous anesthesia was also reviewed. The risks                            and benefits of the procedure and the sedation                            options and risks were discussed with the patient.                            All questions were answered, and informed consent                            was obtained. Prior Anticoagulants: The patient has                            taken no previous anticoagulant or antiplatelet                            agents. ASA Grade Assessment: IV - A patient with  severe systemic disease that is a constant threat                            to life. After reviewing the risks and benefits,                            the patient was deemed in satisfactory condition to                            undergo the procedure.                           After obtaining informed consent, the endoscope was   passed under direct vision. Throughout the                            procedure, the patient's blood pressure, pulse, and                            oxygen saturations were monitored continuously. The                            GIF-H190 ([id number] Olympus adult endoscope was                            introduced through the mouth, and advanced to the                            second part of duodenum. The upper GI endoscopy was                            accomplished without difficulty. The patient                            tolerated the procedure well. Scope In: Scope Out: Findings:      A small hiatal hernia was present.      There is no endoscopic evidence of esophagitis, inflammation, stricture       or varices in the entire esophagus.      Two non-bleeding superficial gastric ulcers with no stigmata of bleeding       were found on the posterior wall of the stomach and in the gastric       antrum. The largest lesion was 10 mm in largest dimension. Biopsies were       taken with a cold forceps for histology.      The examined duodenum was normal. Impression:               - Small hiatal hernia.                           - Non-bleeding gastric ulcers with no stigmata of                            bleeding. Biopsied. Probably cause of her nausea.                           -  Normal examined duodenum. Moderate Sedation:      See anesthesia note, no moderate sedation. Recommendation:           - Return patient to hospital ward for ongoing care.                           - Clear liquid diet. Procedure Code(s):        --- Professional ---                           [id number], Esophagogastroduodenoscopy, flexible,                            transoral; with biopsy, single or multiple Diagnosis Code(s):        --- Professional ---                           K25.9, Gastric ulcer, unspecified as acute or                            chronic, without hemorrhage or perforation                            R11.2, Nausea with vomiting, unspecified                           K44.9, Diaphragmatic hernia without obstruction or                            gangrene CPT copyright 2017 [employer] Association. All rights reserved. The codes documented in this report are preliminary and upon coder review may  be revised to meet current compliance requirements. Nancy Fetter Dr., MD 09/29/2018 11:11:55 AM This report has been signed electronically. Number of Addenda: 0

## 2018-09-30 LAB — BASIC METABOLIC PANEL
ANION GAP: 12 (ref 5–15)
BUN: <5 mg/dL — ABNORMAL LOW (ref 8–23)
CALCIUM: 8.9 mg/dL (ref 8.9–10.3)
CO2: 24 mmol/L (ref 22–32)
Chloride: 103 mmol/L (ref 98–111)
Creatinine, Ser: 0.6 mg/dL (ref 0.44–1.00)
Glucose, Bld: 84 mg/dL (ref 70–99)
Potassium: 3.8 mmol/L (ref 3.5–5.1)
SODIUM: 139 mmol/L (ref 135–145)

## 2018-09-30 MED ORDER — ONDANSETRON HCL 4 MG/2ML IJ SOLN
4.0000 mg | Freq: Four times a day (QID) | INTRAMUSCULAR | Status: DC
  Administered 2018-09-30 – 2018-10-02 (×7): 4 mg via INTRAVENOUS
  Filled 2018-09-30 (×7): qty 2

## 2018-09-30 MED ORDER — HYDROCODONE-ACETAMINOPHEN 5-325 MG PO TABS
1.0000 | ORAL_TABLET | Freq: Four times a day (QID) | ORAL | Status: DC | PRN
  Administered 2018-09-30 – 2018-10-02 (×5): 1 via ORAL
  Filled 2018-09-30 (×7): qty 1

## 2018-09-30 MED ORDER — PANTOPRAZOLE SODIUM 40 MG PO TBEC: 40.0000 mg | DELAYED_RELEASE_TABLET | Freq: Two times a day (BID) | ORAL | 0 refills | Status: DC

## 2018-09-30 NOTE — Progress Notes (Signed)
PROGRESS NOTE    Faith [name]   YYT:[id number]  DOB: 04/02/1951  DOA: 09/27/2018 PCP: Patient, No Pcp Per   Brief Narrative:  Faith [name] is a 67 y.o. female with history of metastatic bladder cancer who received chemotherapy in the first week of September following which patient was admitted for [facility] picture at the time patient has been having nausea vomiting and eventually was discharged to rehab. The patient was discharged from rehab last Saturday 5 days ago.  Following day patient started developing nausea vomiting again with upper abdominal discomfort.  Denies any diarrhea has had normal bowel movement.  Denies any headache fever chills or any blood in the vomitus.   Subjective: Nausea is less severe- no abdominal pain. Tolerating clears.   Assessment & Plan:   Principal Problem:   Nausea & vomiting - intractable for almost 2 months now- no specific exacerbating factors and nausea is present on a near constant basis- PPI was started a few weeks ago but she does not feel it has helped - last CT did not show any issues with the gallbladder, liver, pancrease or stomach - head CT shows not metastasis  - LFTs normal - Lipase normal last month- not rechecked - have asked for a GI eval- d/w Dr Oletta Lamas - cont IVF - EGD >>Non-bleeding gastric ulcers with no stigmata of bleeding - US abdomen> Gallbladder sludge without cholelithiasis or acute cholecystitis..  - will slowly start clears today to see how she tolerates them - PPI should be increased to BID- may need to add Carafate if she is still symptomatic - cont IVF until able to take POs appropriately - will try to advance to soft diet today- may need GES if not improving  Active Problems:  Hypokalemia - likely due to vomiting- being replaced in IVF    HTN (hypertension) - cont Amlodipine, Irbesartan, Metoprolol    Urothelial carcinoma of bladder  - high grade with metastatic disease to lymph nodes, bones, lungs -  s/p radiation to T and L spine 9/19 - Gemcitabine and cisplatin therapy started on 08/31/2018     DVT prophylaxis: lovenox Code Status: Full code Family Communication: sister in law Disposition Plan: cont to follow oral intake and GI work up Consultants:   [facility] Procedures:   EGD 10/4 Antimicrobials:  Anti-infectives (From admission, onward)   None       Objective: Vitals:   09/29/18 1300 09/29/18 2056 09/30/18 0525 09/30/18 0702  BP: (!) 148/70 (!) 169/87 (!) 171/79 (!) 153/87  Pulse: 67 77 64 69  Resp: 16 16 12    Temp: 98.1 F (36.7 C) 97.8 F (36.6 C) 97.9 F (36.6 C)   TempSrc: Oral  Oral   SpO2: 97% 100% 99%   Weight:      Height:        Intake/Output Summary (Last 24 hours) at 09/30/2018 1206 Last data filed at 09/30/2018 0821 Gross per 24 hour  Intake 2625.36 ml  Output 850 ml  Net 1775.36 ml   Filed Weights   09/28/18 0251  Weight: 71.5 kg    Examination: General exam: Appears comfortable  HEENT: PERRLA, oral mucosa moist, no sclera icterus or thrush Respiratory system: Clear to auscultation. Respiratory effort normal. Cardiovascular system: S1 & S2 heard, RRR.   Gastrointestinal system: Abdomen soft,  nontender, nondistended. Normal bowel sound. No organomegaly Central nervous system: Alert and oriented. No focal neurological deficits. Extremities: No cyanosis, clubbing or edema Skin: No rashes or ulcers Psychiatry:  Mood & affect appropriate.     Data Reviewed: I have personally reviewed following labs and imaging studies  CBC: Recent Labs  Lab 09/27/18 2353 09/27/18 2358 09/28/18 0423  WBC 7.5  --  8.1  NEUTROABS 5.7  --  6.2  HGB 11.7* 11.6* 11.5*  HCT 35.4* 34.0* 34.3*  MCV 80.1  --  79.8  PLT 229  --  935   Basic Metabolic Panel: Recent Labs  Lab 09/27/18 2358 09/28/18 0156 09/28/18 0423 09/29/18 0323 09/30/18 0356  NA 137  --  140 138 139  K 2.9*  --  3.9 3.8 3.8  CL 98  --  104 104 103  CO2  --   --  23 24 24     GLUCOSE 108*  --  94 81 84  BUN 10  --  11 6* <5*  CREATININE 0.50  --  0.65 0.56 0.60  CALCIUM  --   --  9.4 8.9 8.9  MG  --  1.7  --   --   --    GFR: Estimated Creatinine Clearance: 65 mL/min (by C-G formula based on SCr of 0.6 mg/dL). Liver Function Tests: Recent Labs  Lab 09/28/18 0423  AST 19  ALT 12  ALKPHOS 82  BILITOT 1.0  PROT 6.8  ALBUMIN 3.0*   No results for input(s): LIPASE, AMYLASE in the last 168 hours. No results for input(s): AMMONIA in the last 168 hours. Coagulation Profile: No results for input(s): INR, PROTIME in the last 168 hours. Cardiac Enzymes: No results for input(s): CKTOTAL, CKMB, CKMBINDEX, TROPONINI in the last 168 hours. BNP (last 3 results) No results for input(s): PROBNP in the last 8760 hours. HbA1C: No results for input(s): HGBA1C in the last 72 hours. CBG: No results for input(s): GLUCAP in the last 168 hours. Lipid Profile: No results for input(s): CHOL, HDL, LDLCALC, TRIG, CHOLHDL, LDLDIRECT in the last 72 hours. Thyroid Function Tests: No results for input(s): TSH, T4TOTAL, FREET4, T3FREE, THYROIDAB in the last 72 hours. Anemia Panel: No results for input(s): VITAMINB12, FOLATE, FERRITIN, TIBC, IRON, RETICCTPCT in the last 72 hours. Urine analysis:    Component Value Date/Time   COLORURINE YELLOW 09/28/2018 0337   APPEARANCEUR CLOUDY (A) 09/28/2018 0337   LABSPEC 1.023 09/28/2018 0337   PHURINE 5.0 09/28/2018 0337   GLUCOSEU NEGATIVE 09/28/2018 0337   HGBUR MODERATE (A) 09/28/2018 0337   BILIRUBINUR NEGATIVE 09/28/2018 0337   KETONESUR 80 (A) 09/28/2018 0337   PROTEINUR 100 (A) 09/28/2018 0337   NITRITE NEGATIVE 09/28/2018 0337   LEUKOCYTESUR NEGATIVE 09/28/2018 0337   Sepsis Labs: @LABRCNTIP (procalcitonin:4,lacticidven:4) )No results found for this or any previous visit (from the past 240 hour(s)).       Radiology Studies: US Abdomen Limited  Result Date: 09/28/2018 CLINICAL DATA:  67 year old female with acute  RIGHT UPPER quadrant abdominal pain with nausea and vomiting. EXAM: ULTRASOUND ABDOMEN LIMITED RIGHT UPPER QUADRANT COMPARISON:  None. FINDINGS: Gallbladder: Gallbladder sludge identified. There is no evidence of cholelithiasis or acute cholecystitis. Common bile duct: Diameter: 6 mm.  No intrahepatic or extrahepatic biliary dilatation. Liver: No focal lesion identified. Within normal limits in parenchymal echogenicity. Portal vein is patent on color Doppler imaging with normal direction of blood flow towards the liver. IMPRESSION: 1. Gallbladder sludge without cholelithiasis or acute cholecystitis. 2. No other significant abnormalities.  No biliary dilatation. Electronically Signed   By: Margarette Canada M.D.   On: 09/28/2018 18:09      Scheduled Meds: . amLODipine  10  mg Oral q morning - 10a  . enoxaparin (LOVENOX) injection  40 mg Subcutaneous Daily  . fentaNYL  25 mcg Transdermal Q72H  . ferrous sulfate  325 mg Oral Daily  . irbesartan  300 mg Oral Daily  . metoprolol succinate  100 mg Oral Daily  . montelukast  10 mg Oral Daily  . ondansetron (ZOFRAN) IV  4 mg Intravenous Q6H  . pantoprazole  40 mg Oral BID AC  . polyethylene glycol  17 g Oral QODAY  . rosuvastatin  5 mg Oral q1800   Continuous Infusions: . 0.9 % NaCl with KCl 20 mEq / L 75 mL/hr at 09/30/18 1057     LOS: 1 day    Time spent in minutes: 40    Debbe Odea, MD [facility] Pager: [email or web address] Password [email or web address] 09/30/2018, 12:06 PM

## 2018-09-30 NOTE — Progress Notes (Signed)
Faith [name] 11:48 AM  Subjective: Patient seen and examined and we discussed her medical course and her case discussed with my partner Dr. Oletta Lamas and we reviewed her endoscopy and she has no new complaints but did spit up a little this morning  Objective: Vital signs stable afebrile no acute distress in good spirits abdomen is soft nontender no succussion splash  Assessment: Nausea vomiting questionable etiology  Plan: I wonder if fentanyl patch is playing a role and would consider gastric emptying study on Monday if no better through the weekend  [facility]  Pager [phone] After 5PM or if no answer call [phone]

## 2018-09-30 NOTE — Progress Notes (Signed)
Patient states that she has "crushing" pain in her back. The pain is rated as 9/10.  The PCP was notified

## 2018-10-01 LAB — BASIC METABOLIC PANEL
Anion gap: 11 (ref 5–15)
BUN: <5 mg/dL — ABNORMAL LOW (ref 8–23)
CALCIUM: 9.3 mg/dL (ref 8.9–10.3)
CHLORIDE: 100 mmol/L (ref 98–111)
CO2: 28 mmol/L (ref 22–32)
CREATININE: 0.7 mg/dL (ref 0.44–1.00)
GFR calc Af Amer: >60 mL/min (ref >60)
GFR calc non Af Amer: >60 mL/min (ref >60)
GLUCOSE: 95 mg/dL (ref 70–99)
Potassium: 3.5 mmol/L (ref 3.5–5.1)
Sodium: 139 mmol/L (ref 135–145)

## 2018-10-01 NOTE — Plan of Care (Signed)
Pt alert and oriented, plan of care discussed with pt. RN will monitor.

## 2018-10-01 NOTE — Progress Notes (Signed)
Faith [name] 10:57 AM  Subjective: Patient doing better this morning and case discussed with multiple family members as well as the hospital team and we discussed her radiation as well as her fentanyl patch and she has no new complaints  Objective: Vital signs stable afebrile patient looks well in good spirits not examined today electrolytes stable path pending  Assessment: Nausea vomiting currently improved  Plan: We will proceed with gastric emptying study tomorrow if she has recurrent symptoms today otherwise can follow-up with [place] as an outpatient and Dr. Oletta Lamas to call with biopsy report early next week  [facility]  Pager [phone] After 5PM or if no answer call [phone]

## 2018-10-01 NOTE — Progress Notes (Signed)
PROGRESS NOTE    Faith [name]   TZG:[id number]  DOB: 02-Apr-1951  DOA: 09/27/2018 PCP: Patient, No Pcp Per   Brief Narrative:  Faith [name] is a 67 y.o. female with history of metastatic bladder cancer who received chemotherapy in the first week of September following which patient was admitted for [facility] picture at the time patient has been having nausea vomiting and eventually was discharged to rehab. The patient was discharged from rehab last Saturday 5 days ago.  Following day patient started developing nausea vomiting again with upper abdominal discomfort.  Denies any diarrhea has had normal bowel movement.  Denies any headache fever chills or any blood in the vomitus.   Subjective: Nausea is not severe- beginning to tolerate some soft food but still not eating or drinking enough - having normal BMs- vomited once yesterday  Assessment & Plan:   Principal Problem:   Nausea & vomiting - intractable for almost 2 months now- no specific exacerbating factors and nausea is present on a near constant basis- PPI was started a few weeks ago but she does not feel it has helped - last CT did not show any issues with the gallbladder, liver, pancrease or stomach - head CT shows not metastasis  - LFTs normal - Lipase normal last month- not rechecked - have asked for a GI eval- d/w Dr Oletta Lamas - cont IVF - EGD >>Non-bleeding gastric ulcers with no stigmata of bleeding - US abdomen> Gallbladder sludge without cholelithiasis or acute cholecystitis.. 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, bones, lungs - s/p radiation to T and L spine 9/19 - Gemcitabine and cisplatin therapy started on 08/31/2018     DVT prophylaxis: lovenox Code Status: Full code Family Communication: sister in law Disposition Plan: cont to follow oral intake and GI work up Consultants:   [facility] Procedures:   EGD 10/4 Antimicrobials:  Anti-infectives (From admission, onward)   None       Objective: Vitals:   09/30/18 2007 10/01/18 0430 10/01/18 0924 10/01/18 1118  BP: (!) 147/82 (!) 148/77 (!) 153/83   Pulse: 77 72 74   Resp: 14 12 18    Temp: 97.9 F (36.6 C) 98.4 F (36.9 C)    TempSrc: Oral Oral    SpO2: 95% 98% 100% 95%  Weight:      Height:        Intake/Output Summary (Last 24 hours) at 10/01/2018 1357 Last data filed at 10/01/2018 1104 Gross per 24 hour  Intake 1105.54 ml  Output 1550 ml  Net -444.46 ml   Filed Weights   09/28/18 0251  Weight: 71.5 kg    Examination: General exam: Appears comfortable  HEENT: PERRLA, oral mucosa moist, no sclera icterus or thrush Respiratory system: Clear to auscultation. Respiratory effort normal. Cardiovascular system: S1 & S2 heard, RRR.   Gastrointestinal system: Abdomen soft,  nontender, nondistended. Normal bowel sound. No organomegaly Central nervous system: Alert and oriented. No focal neurological deficits. Extremities: No cyanosis, clubbing or edema Skin: No rashes or ulcers  Psychiatry:  Mood & affect appropriate.     Data Reviewed: I have personally reviewed following labs and imaging studies  CBC: Recent Labs  Lab 09/27/18 2353 09/27/18 2358 09/28/18 0423  WBC 7.5  --  8.1  NEUTROABS 5.7  --  6.2  HGB 11.7* 11.6* 11.5*  HCT 35.4* 34.0* 34.3*  MCV 80.1  --  79.8  PLT 229  --  482   Basic Metabolic Panel: Recent Labs  Lab 09/27/18 2358 09/28/18 0156 09/28/18 0423 09/29/18 0323 09/30/18 0356 10/01/18 0444  NA 137  --  140 138 139 139  K 2.9*  --  3.9 3.8 3.8 3.5    CL 98  --  104 104 103 100  CO2  --   --  23 24 24 28   GLUCOSE 108*  --  94 81 84 95  BUN 10  --  11 6* <5* <5*  CREATININE 0.50  --  0.65 0.56 0.60 0.70  CALCIUM  --   --  9.4 8.9 8.9 9.3  MG  --  1.7  --   --   --   --    GFR: Estimated Creatinine Clearance: 65 mL/min (by C-G formula based on SCr of 0.7 mg/dL). Liver Function Tests: Recent Labs  Lab 09/28/18 0423  AST 19  ALT 12  ALKPHOS 82  BILITOT 1.0  PROT 6.8  ALBUMIN 3.0*   No results for input(s): LIPASE, AMYLASE in the last 168 hours. No results for input(s): AMMONIA in the last 168 hours. Coagulation Profile: No results for input(s): INR, PROTIME in the last 168 hours. Cardiac Enzymes: No results for input(s): CKTOTAL, CKMB, CKMBINDEX, TROPONINI in the last 168 hours. BNP (last 3 results) No results for input(s): PROBNP in the last 8760 hours. HbA1C: No results for input(s): HGBA1C in the last 72 hours. CBG: No results for input(s): GLUCAP in the last 168 hours. Lipid Profile: No results for input(s): CHOL, HDL, LDLCALC, TRIG, CHOLHDL, LDLDIRECT in the last 72 hours. Thyroid Function Tests: No results for input(s): TSH, T4TOTAL, FREET4, T3FREE, THYROIDAB in the last 72 hours. Anemia Panel: No results for input(s): VITAMINB12, FOLATE, FERRITIN, TIBC, IRON, RETICCTPCT in the last 72 hours. Urine analysis:    Component Value Date/Time   COLORURINE YELLOW 09/28/2018 0337   APPEARANCEUR CLOUDY (A) 09/28/2018 0337   LABSPEC 1.023 09/28/2018 0337   PHURINE 5.0 09/28/2018 0337   GLUCOSEU NEGATIVE 09/28/2018 0337   HGBUR MODERATE (A) 09/28/2018 0337   BILIRUBINUR NEGATIVE 09/28/2018 0337   KETONESUR 80 (A) 09/28/2018 0337   PROTEINUR 100 (A) 09/28/2018 0337   NITRITE NEGATIVE 09/28/2018 0337   LEUKOCYTESUR NEGATIVE 09/28/2018 0337   Sepsis Labs: @LABRCNTIP (procalcitonin:4,lacticidven:4) )No results found for this or any previous visit (from the past 240 hour(s)).       Radiology Studies: No results  found.    Scheduled Meds: . amLODipine  10 mg Oral q morning - 10a  . enoxaparin (LOVENOX) injection  40 mg Subcutaneous Daily  . fentaNYL  25 mcg Transdermal Q72H  . ferrous sulfate  325 mg Oral Daily  . irbesartan  300 mg Oral Daily  . metoprolol succinate  100 mg Oral Daily  . montelukast  10 mg Oral Daily  . ondansetron (ZOFRAN) IV  4 mg Intravenous Q6H  . pantoprazole  40 mg Oral BID AC  . polyethylene glycol  17 g Oral QODAY  . rosuvastatin  5 mg Oral q1800   Continuous Infusions: . 0.9 % NaCl with KCl  20 mEq / L 10 mL/hr at 10/01/18 0100     LOS: 2 days    Time spent in minutes: 40    Debbe Odea, MD [facility] Pager: [email or web address] Password [email or web address] 10/01/2018, 1:57 PM

## 2018-10-02 ENCOUNTER — Encounter (HOSPITAL_COMMUNITY): Payer: Self-pay | Admitting: Gastroenterology

## 2018-10-02 DIAGNOSIS — K279 Peptic ulcer, site unspecified, unspecified as acute or chronic, without hemorrhage or perforation: Secondary | ICD-10-CM

## 2018-10-02 MED ORDER — POLYETHYLENE GLYCOL 3350 17 G PO PACK: 17.0000 g | PACK | Freq: Every day | ORAL | 2 refills | Status: AC

## 2018-10-02 MED ORDER — PANTOPRAZOLE SODIUM 40 MG PO TBEC: 40.0000 mg | DELAYED_RELEASE_TABLET | Freq: Two times a day (BID) | ORAL | 3 refills | Status: AC

## 2018-10-02 MED ORDER — ONDANSETRON 4 MG PO TBDP: 4.0000 mg | ORAL_TABLET | Freq: Three times a day (TID) | ORAL | 3 refills | Status: AC | PRN

## 2018-10-02 MED ORDER — HEPARIN SOD (PORK) LOCK FLUSH 100 UNIT/ML IV SOLN
500.0000 [IU] | INTRAVENOUS | Status: AC | PRN
  Administered 2018-10-02: 500 [IU]

## 2018-10-02 MED ORDER — POLYETHYLENE GLYCOL 3350 17 G PO PACK: 17.0000 g | PACK | Freq: Every day | ORAL | 2 refills | Status: DC

## 2018-10-02 NOTE — Discharge Instructions (Signed)
You were cared for by a hospitalist during your hospital stay. If you have any questions about your discharge medications or the care you received while you were in the hospital after you are discharged, you can call the unit and asked to speak with the hospitalist on call if the hospitalist that took care of you is not available. Once you are discharged, your primary care physician will handle any further medical issues.   Please note that NO REFILLS for any discharge medications will be authorized once you are discharged, as it is imperative that you return to your primary care physician (or establish a relationship with a primary care physician if you do not have one) for your aftercare needs so that they can reassess your need for medications and monitor your lab values.  Please take all your medications with you for your next visit with your Primary MD. Please ask your Primary MD to get all Hospital records sent to his/her office. Please request your Primary MD to go over all hospital test results at the follow up.   If you experience worsening of your admission symptoms, develop shortness of breath, chest pain, suicidal or homicidal thoughts or a life threatening emergency, you must seek medical attention immediately by calling 911 or calling your MD.   Dennis Bast must read the complete instructions/literature along with all the possible adverse reactions/side effects for all the medicines you take including new medications that have been prescribed to you. Take new medicines after you have completely understood and accpet all the possible adverse reactions/side effects.    Do not drive when taking pain medications or sedatives.     Do not take more than prescribed Pain, Sleep and Anxiety Medications   If you have smoked or chewed Tobacco in the last 2 yrs please stop. Stop any regular alcohol  and or recreational drug use.   Wear Seat belts while driving.   Bland Diet A bland diet consists of  foods that do not have a lot of fat or fiber. Foods without fat or fiber are easier for the body to digest. They are also less likely to irritate your mouth, throat, stomach, and other parts of your gastrointestinal tract. A bland diet is sometimes called a BRAT diet. What is my plan? Your health care provider or dietitian may recommend specific changes to your diet to prevent and treat your symptoms, such as:  Eating small meals often.  Cooking food until it is soft enough to chew easily.  Chewing your food well.  Drinking fluids slowly.  Not eating foods that are very spicy, sour, or fatty.  Not eating citrus fruits, such as oranges and grapefruit.  What do I need to know about this diet?  Eat a variety of foods from the bland diet food list.  Do not follow a bland diet longer than you have to.  Ask your health care provider whether you should take vitamins. What foods can I eat? Grains  Hot cereals, such as cream of wheat. Bread, crackers, or tortillas made from refined white flour. Rice. Vegetables Canned or cooked vegetables. Mashed or boiled potatoes. Fruits Bananas. Applesauce. Other types of cooked or canned fruit with the skin and seeds removed, such as canned peaches or pears. Meats and Other Protein Sources Scrambled eggs. Creamy peanut butter or other nut butters. Lean, well-cooked meats, such as chicken or fish. Tofu. Soups or broths. Dairy Low-fat dairy products, such as milk, cottage cheese, or yogurt. Beverages Water. Herbal tea.  Apple juice. Sweets and Desserts Pudding. Custard. Fruit gelatin. Ice cream. Fats and Oils Mild salad dressings. Canola or olive oil. The items listed above may not be a complete list of allowed foods or beverages. Contact your dietitian for more options. What foods are not recommended? Foods and ingredients that are often not recommended include:  Spicy foods, such as hot sauce or salsa.  Fried foods.  Sour foods, such as  pickled or fermented foods.  Raw vegetables or fruits, especially citrus or berries.  Caffeinated drinks.  Alcohol.  Strongly flavored seasonings or condiments.  The items listed above may not be a complete list of foods and beverages that are not allowed. Contact your dietitian for more information. This information is not intended to replace advice given to you by your health care provider. Make sure you discuss any questions you have with your health care provider. Document Released: 04/05/2016 Document Revised: 05/20/2016 Document Reviewed: 12/25/2014 Elsevier Interactive Patient Education  2018 [place].   Peptic Ulcer  A peptic ulcer is a painful sore in the lining of your esophagus, stomach, or the first part of your small intestine. You may have pain in the area between your chest and your belly button. The most common causes of an ulcer are:  An infection.  Using certain pain medicines too often or too much.  Follow these instructions at home:  Avoid alcohol.  Avoid caffeine.  Do not use any tobacco products. These include cigarettes, chewing tobacco, and e-cigarettes. If you need help quitting, ask your doctor.  Take over-the-counter and prescription medicines only as told by your doctor. Do not stop or change your medicines unless you talk with your doctor about it first.  Keep all follow-up visits as told by your doctor. This is important. Contact a doctor if:  You do not get better in 7 days after you start treatment.  You keep having an upset stomach (indigestion) or heartburn. Get help right away if:  You have sudden, sharp pain in your belly (abdomen).  You have lasting belly pain.  You have bloody poop (stool) or black, tarry poop.  You throw up (vomit) blood. It may look like coffee grounds.  You feel light-headed or feel like you may pass out (faint).  You get weak.  You get sweaty or feel sticky and cold to the touch (clammy). This  information is not intended to replace advice given to you by your health care provider. Make sure you discuss any questions you have with your health care provider. Document Released: 03/09/2010 Document Revised: 04/28/2016 Document Reviewed: 09/13/2015 Elsevier Interactive Patient Education  [employer].

## 2018-10-02 NOTE — Care Management Important Message (Signed)
Important Message  Patient Details  Name: SYLA DEVOSS MRN: [id number] Date of Birth: 08/15/1951   Medicare Important Message Given:  Yes    Kerin Salen 10/02/2018, 2:14 [place] Message  Patient Details  Name: CALVIN CHURA MRN: [id number] Date of Birth: 03-20-1951   Medicare Important Message Given:  Yes    Kerin Salen 10/02/2018, 2:13 PM

## 2018-10-02 NOTE — Discharge Summary (Addendum)
Physician Discharge Summary  Faith [name] WNI:[id number] DOB: 1951-10-29 DOA: 09/27/2018  PCP: Patient, No Pcp Per  Admit date: 09/27/2018 Discharge date: 10/02/2018  Admitted From: home Disposition:  home   Recommendations for Outpatient Follow-up:  1. GI recommends PPI to continue for many months  *    Discharge Condition:  stable   CODE STATUS:  Full code   Consultations:  GI    Discharge Diagnoses:  Principal Problem:   Intractable vomiting with nausea Active Problems:   PUD (peptic ulcer disease)   HTN (hypertension)   Urothelial carcinoma of bladder ([place])   Bone metastasis ([place])   Hypokalemia      Brief Summary: Faith [name] is a 67 y.o.femalewithhistory of metastatic bladder cancer who received chemotherapy in the first week of September following which patient was admitted for [facility] picture at the time patient has been having nausea vomiting and eventually was discharged to rehab. The patient was discharged from rehab last Saturday 5 days ago. Following day patient started developing nausea vomiting again with upper abdominal discomfort. Denies any diarrhea has had normal bowel movement. Denies any headache fever chills or any blood in the vomitus.  Hospital Course:  Principal Problem:   Nausea & vomiting - intractable for almost 2 months now- no specific exacerbating factors and nausea is present on a near constant basis- PPI was started a few weeks ago but she does not feel it has helped - last CT did not show any issues with the gallbladder, liver, pancrease or stomach - head CT shows not metastasis  - LFTs normal - Lipase normal last month- not rechecked - have asked for a GI eval- she was seen by  Dr Oletta Lamas who recommended an EGD. We also obtained an ultrasound of the abdomen - EGD >>Non-bleeding gastric ulcers with no stigmata of bleeding - US abdomen> Gallbladder sludge without cholelithiasis or acute cholecystitis.. - PPI increased to BID-  may need to add Carafate if she is still symptomatic - ulcers may be secondary to recent radiation (at level of T8. She also has issues with constipation and I have asked her to take laxatives regularly to prevent constipation. Due to extensive band like upper abdominal pain, we cannot yet wean her chronic narcotics - now tolerating a soft diet and maintaining adequate hydration- IVF can be discontinued and she can be discharged home on a BID PPI  Active Problems:  Hypokalemia - likely due to vomiting- being adequately replaced in IVF    HTN (hypertension) - cont Amlodipine, Irbesartan, Metoprolol    Urothelial carcinoma of bladder  - high grade with metastatic disease to lymph nodes, bones, lungs - s/p radiation to T and L spine 9/19 - Gemcitabine and cisplatin therapy started on 08/31/2018    Consultants:   [facility] Procedures:   EGD 10/4  Discharge Exam: Vitals:   10/01/18 2116 10/02/18 0609  BP: 139/84 (!) 169/84  Pulse: 71 83  Resp: 18 18  Temp: 97.8 F (36.6 C) 98.3 F (36.8 C)  SpO2: 97% 95%   Vitals:   10/01/18 1118 10/01/18 1453 10/01/18 2116 10/02/18 0609  BP:  133/89 139/84 (!) 169/84  Pulse:  79 71 83  Resp:  18 18 18   Temp:  98.6 F (37 C) 97.8 F (36.6 C) 98.3 F (36.8 C)  TempSrc:  Oral Oral Oral  SpO2: 95% 97% 97% 95%  Weight:      Height:        General: Pt is alert,  awake, not in acute distress Cardiovascular: RRR, S1/S2 +, no rubs, no gallops Respiratory: CTA bilaterally, no wheezing, no rhonchi Abdominal: Soft, NT, ND, bowel sounds + Extremities: no edema, no cyanosis   Discharge Instructions  Discharge Instructions    Diet - low sodium heart healthy   Complete by:  As directed    Bland diet   Increase activity slowly   Complete by:  As directed      Allergies as of 10/02/2018      Reactions   Tramadol Nausea And Vomiting      Medication List    STOP taking these medications   omeprazole 40 MG capsule Commonly known as:   PRILOSEC     TAKE these medications   acetaminophen 325 MG tablet Commonly known as:  TYLENOL Take 650 mg by mouth every 6 (six) hours as needed for mild pain, moderate pain or headache.   amLODipine 10 MG tablet Commonly known as:  NORVASC Take 1 tablet (10 mg total) by mouth every morning.   fentaNYL 25 MCG/HR patch Commonly known as:  DURAGESIC - dosed mcg/hr Place 1 patch (25 mcg total) onto the skin every 3 (three) days.   ferrous sulfate 325 (65 FE) MG tablet Start taking only after constipation is relieved. Take 1 tablet once daily for 1 week, then 1 tablet twice daily for 1 week and then 1 tablet three times daily. What changed:    how much to take  how to take this  when to take this  additional instructions   lidocaine-prilocaine cream Commonly known as:  EMLA Apply 1 application topically as needed. What changed:  reasons to take this   LORazepam 0.5 MG tablet Commonly known as:  ATIVAN Take 1 tablet (0.5 mg total) by mouth every 4 (four) hours as needed for anxiety.   metoprolol succinate 100 MG 24 hr tablet Commonly known as:  TOPROL-XL Take 100 mg by mouth daily.   montelukast 10 MG tablet Commonly known as:  SINGULAIR Take 10 mg by mouth daily.   olmesartan 40 MG tablet Commonly known as:  BENICAR Take 40 mg by mouth daily.   ondansetron 4 MG disintegrating tablet Commonly known as:  ZOFRAN-ODT Take 1-2 tablets (4-8 mg total) by mouth every 8 (eight) hours as needed for nausea. What changed:    medication strength  how much to take   oxyCODONE-acetaminophen 5-325 MG tablet Commonly known as:  PERCOCET/ROXICET Take 1 tablet by mouth every 6 (six) hours as needed (for breakthrough pain).   pantoprazole 40 MG tablet Commonly known as:  PROTONIX Take 1 tablet (40 mg total) by mouth 2 (two) times daily before a meal.   polyethylene glycol packet Commonly known as:  MIRALAX / GLYCOLAX Take 17 g by mouth daily. What changed:  when to take  this   prochlorperazine 10 MG tablet Commonly known as:  COMPAZINE Take 1 tablet (10 mg total) by mouth every 6 (six) hours as needed for nausea or vomiting.   rosuvastatin 5 MG tablet Commonly known as:  CRESTOR Take 5 mg by mouth daily.   senna-docusate 8.6-50 MG tablet Commonly known as:  Senokot-S Take 1 tablet by mouth daily as needed. What changed:  reasons to take this   STOOL SOFTENER PO Take 2 tablets by mouth daily as needed (constipation).       Allergies  Allergen Reactions  . Tramadol Nausea And Vomiting     Procedures/Studies: EGD: A small hiatal hernia was present.  There is no endoscopic evidence of esophagitis, inflammation, stricture       or varices in the entire esophagus.      Two non-bleeding superficial gastric ulcers with no stigmata of bleeding       were found on the posterior wall of the stomach and in the gastric       antrum. The largest lesion was 10 mm in largest dimension. Biopsies were       taken with a cold forceps for histology.      The examined duodenum was normal.  Dg Chest 2 View  Result Date: 09/03/2018 CLINICAL DATA:  Nausea and weakness. Fever. Bladder cancer metastatic to the skeleton, lungs, and mediastinum. Recent initiation of radiation therapy 2 days ago. Chemotherapy starting 1 day ago. EXAM: CHEST - 2 VIEW COMPARISON:  Chest radiograph from 09/02/2018 FINDINGS: Power injectable right Port-A-Cath tip: SVC. Atherosclerotic calcification of the aortic arch. The patient is rotated to the right on today's radiograph, reducing diagnostic sensitivity and specificity. Bandlike density in the lingula likely from atelectasis or scarring, but this had a hypermetabolic component on the PET-CT suggesting at least a part of this is due to a metastatic nodule. The right lung appears clear although there is a known hypermetabolic pulmonary nodule medially at the right lung base in the retro diaphragmatic region which is not well seen by  conventional radiography. Patient has a known sternal metastatic lesion, not well seen on today's chest radiograph. There is some minimal blunting of the left posterior costophrenic angle and possibly the right posterior costophrenic angle. Mild enlargement of the cardiopericardial silhouette, without edema. The patient has known extensive thoracic spine metastatic disease which is poorly apparent on today's chest radiographs. IMPRESSION: 1. Stable mild cardiomegaly, without edema. 2. Mild lingular atelectasis with a known underlying hypermetabolic pulmonary nodule in this vicinity. 3. Other nodules at the right lung base and bony involvement in the sternum and thoracic spine are not readily apparent on conventional radiography. 4. Blunting of the posterior costophrenic angle suggesting small pleural effusions. 5.  Aortic Atherosclerosis (ICD10-I70.0). Electronically Signed   By: Van Clines M.D.   On: 09/03/2018 16:16   Ct Head Wo Contrast  Result Date: 09/28/2018 CLINICAL DATA:  Nausea, vomiting. EXAM: CT HEAD WITHOUT CONTRAST TECHNIQUE: Contiguous axial images were obtained from the base of the skull through the vertex without intravenous contrast. COMPARISON:  None. FINDINGS: Brain: No evidence of acute infarction, hemorrhage, hydrocephalus, extra-axial collection or mass lesion/mass effect. Vascular: No hyperdense vessel or unexpected calcification. Skull: Normal. Negative for fracture or focal lesion. Sinuses/Orbits: No acute finding. Other: None. IMPRESSION: Normal head CT. Electronically Signed   By: Marijo Conception, M.D.   On: 09/28/2018 09:07   Ct Abdomen Pelvis W Contrast  Result Date: 09/03/2018 CLINICAL DATA:  History of bladder cancer. The patient is undergoing chemotherapy and radiation therapy. Nausea and vomiting. EXAM: CT ABDOMEN AND PELVIS WITH CONTRAST TECHNIQUE: Multidetector CT imaging of the abdomen and pelvis was performed using the standard protocol following bolus  administration of intravenous contrast. CONTRAST:  161mL ISOVUE-300 IOPAMIDOL (ISOVUE-300) INJECTION 61% COMPARISON:  Body CT 07/21/2018 FINDINGS: Lower chest: Right lower lobe pulmonary nodule measures 1.5 cm, stable. Subpleural nodularity in the left lower lobe, mainly 12 mm soft tissue nodule along the medial lower lobe pleura, and several soft tissue nodules along the inferolateral left lower lobe pleura. Small left pleural effusion. Small hiatal hernia. Hepatobiliary: Subtle 5 mm area of hypoattenuation in the inferior aspect of the  right lobe of the liver, image 28/85, sequence 2. No gallstones, gallbladder wall thickening, or biliary dilatation. Pancreas: Unremarkable. No pancreatic ductal dilatation or surrounding inflammatory changes. Spleen: Normal in size without focal abnormality. Adrenals/Urinary Tract: Normal appearance of the adrenal glands. Benign-appearing 4.2 cm left renal cyst. 6 mm hypoattenuated nodule in the superior cortex of the right kidney, too small to be actually characterize. Stable appearance of the bladder. Stomach/Bowel: Stomach is within normal limits. No evidence of appendicitis. No evidence of bowel wall thickening, distention, or inflammatory changes. Vascular/Lymphatic: Aortic atherosclerosis. No enlarged abdominal or pelvic lymph nodes. Reproductive: Uterus and bilateral adnexa are unremarkable. Other: No abdominal wall hernia or abnormality. No abdominopelvic ascites. Musculoskeletal: Interval increase in the soft tissue component of the lytic lesions of the L2 spinous process and left lateral process with extension to the lamina and pedicle. New lytic lesion within the inferior aspect of T12 vertebral body. Increase lytic lesion within the superior endplate of L1 vertebral body with interval development of compression deformity of the superior endplate. Enlarging lytic lesions within the anterior aspect of the L3 and L4 vertebral bodies. IMPRESSION: Worsening pulmonary  metastatic disease. Mainly, interval development of suspicious subpleural nodules in the left lower lobe and lingula. Small left pleural effusion. Stable right lower lobe pulmonary nodule. Worsening skeletal metastatic disease. Interval increase in the soft tissue component of the lytic lesions of the L2 spinous process and left lateral process with extension to the lamina and pedicle. New lytic lesion within the inferior aspect of T12 vertebral body. Increase lytic lesion within the superior endplate of L1 vertebral body with interval development of compression fracture of the superior endplate. Enlarging lytic lesions within the anterior aspect of the L3 and L4 vertebral bodies. Subtle 5 mm area of hypoattenuation in the inferior aspect of right lobe of the liver, too small to be actually characterize. Small hiatal hernia. Electronically Signed   By: Fidela Salisbury M.D.   On: 09/03/2018 16:58   US Abdomen Limited  Result Date: 09/28/2018 CLINICAL DATA:  67 year old female with acute RIGHT UPPER quadrant abdominal pain with nausea and vomiting. EXAM: ULTRASOUND ABDOMEN LIMITED RIGHT UPPER QUADRANT COMPARISON:  None. FINDINGS: Gallbladder: Gallbladder sludge identified. There is no evidence of cholelithiasis or acute cholecystitis. Common bile duct: Diameter: 6 mm.  No intrahepatic or extrahepatic biliary dilatation. Liver: No focal lesion identified. Within normal limits in parenchymal echogenicity. Portal vein is patent on color Doppler imaging with normal direction of blood flow towards the liver. IMPRESSION: 1. Gallbladder sludge without cholelithiasis or acute cholecystitis. 2. No other significant abnormalities.  No biliary dilatation. Electronically Signed   By: Margarette Canada M.D.   On: 09/28/2018 18:09   Dg Chest Port 1 View  Result Date: 09/02/2018 CLINICAL DATA:  Hypotension. EXAM: PORTABLE CHEST 1 VIEW COMPARISON:  07/17/2018 and prior radiographs.  07/24/2018 CT. FINDINGS: The cardiomediastinal  silhouette is unchanged. A RIGHT IJ Port-A-Cath is noted with tip overlying the mid SVC. RIGHT basilar opacity/atelectasis/scarring again noted. No new pulmonary opacities are identified. No definite pleural effusion or pneumothorax. IMPRESSION: No acute abnormality. Electronically Signed   By: Margarette Canada M.D.   On: 09/02/2018 23:48   Dg Abd Acute W/chest  Result Date: 09/28/2018 CLINICAL DATA:  Nausea/vomiting, bladder cancer EXAM: DG ABDOMEN ACUTE W/ 1V CHEST COMPARISON:  Chest radiographs and CT abdomen/pelvis dated 09/03/2018 FINDINGS: Lingular scarring. Additional platelike scarring/atelectasis in the right lower lung. No focal consolidation. No pleural effusion or pneumothorax. The heart is top-normal in size.  Right chest power port terminates in the mid SVC. Nonobstructive bowel gas pattern. No evidence of free air on the lateral decubitus view. Mild degenerative changes the lower lumbar spine. IMPRESSION: No evidence of acute cardiopulmonary disease. No evidence of small bowel obstruction or free air. Electronically Signed   By: Julian Hy M.D.   On: 09/28/2018 02:35     The results of significant diagnostics from this hospitalization (including imaging, microbiology, ancillary and laboratory) are listed below for reference.     Microbiology: No results found for this or any previous visit (from the past 240 hour(s)).   Labs: BNP (last 3 results) No results for input(s): BNP in the last 8760 hours. Basic Metabolic Panel: Recent Labs  Lab 09/27/18 2358 09/28/18 0156 09/28/18 0423 09/29/18 0323 09/30/18 0356 10/01/18 0444  NA 137  --  140 138 139 139  K 2.9*  --  3.9 3.8 3.8 3.5  CL 98  --  104 104 103 100  CO2  --   --  23 24 24 28   GLUCOSE 108*  --  94 81 84 95  BUN 10  --  11 6* <5* <5*  CREATININE 0.50  --  0.65 0.56 0.60 0.70  CALCIUM  --   --  9.4 8.9 8.9 9.3  MG  --  1.7  --   --   --   --    Liver Function Tests: Recent Labs  Lab 09/28/18 0423  AST 19   ALT 12  ALKPHOS 82  BILITOT 1.0  PROT 6.8  ALBUMIN 3.0*   No results for input(s): LIPASE, AMYLASE in the last 168 hours. No results for input(s): AMMONIA in the last 168 hours. CBC: Recent Labs  Lab 09/27/18 2353 09/27/18 2358 09/28/18 0423  WBC 7.5  --  8.1  NEUTROABS 5.7  --  6.2  HGB 11.7* 11.6* 11.5*  HCT 35.4* 34.0* 34.3*  MCV 80.1  --  79.8  PLT 229  --  212   Cardiac Enzymes: No results for input(s): CKTOTAL, CKMB, CKMBINDEX, TROPONINI in the last 168 hours. BNP: Invalid input(s): POCBNP CBG: No results for input(s): GLUCAP in the last 168 hours. D-Dimer No results for input(s): DDIMER in the last 72 hours. Hgb A1c No results for input(s): HGBA1C in the last 72 hours. Lipid Profile No results for input(s): CHOL, HDL, LDLCALC, TRIG, CHOLHDL, LDLDIRECT in the last 72 hours. Thyroid function studies No results for input(s): TSH, T4TOTAL, T3FREE, THYROIDAB in the last 72 hours.  Invalid input(s): FREET3 Anemia work up No results for input(s): VITAMINB12, FOLATE, FERRITIN, TIBC, IRON, RETICCTPCT in the last 72 hours. Urinalysis    Component Value Date/Time   COLORURINE YELLOW 09/28/2018 0337   APPEARANCEUR CLOUDY (A) 09/28/2018 0337   LABSPEC 1.023 09/28/2018 0337   PHURINE 5.0 09/28/2018 0337   GLUCOSEU NEGATIVE 09/28/2018 0337   HGBUR MODERATE (A) 09/28/2018 0337   BILIRUBINUR NEGATIVE 09/28/2018 0337   KETONESUR 80 (A) 09/28/2018 0337   PROTEINUR 100 (A) 09/28/2018 0337   NITRITE NEGATIVE 09/28/2018 0337   LEUKOCYTESUR NEGATIVE 09/28/2018 0337   Sepsis Labs Invalid input(s): PROCALCITONIN,  WBC,  LACTICIDVEN Microbiology No results found for this or any previous visit (from the past 240 hour(s)).   Time coordinating discharge in minutes: 60  SIGNED:   Debbe Odea, MD  [facility] 10/02/2018, 11:35 AM Pager   If 7PM-7AM, please contact night-coverage [email or web address] Password [email or web address]

## 2018-10-02 NOTE — Progress Notes (Signed)
[facility] PROGRESS NOTE Subjective Patient states that she feels 50 to 60% better.  She still is a little queasy when she overeats.  Yesterday she "stuff myself "with pot roast and then felt a little nauseated.  Path on the ulcers is still pending  Objective: Vital signs in last 24 hours: Temp:  [97.8 F (36.6 C)-98.6 F (37 C)] 98.3 F (36.8 C) (10/07 0609) Pulse Rate:  [71-83] 83 (10/07 0609) Resp:  [18] 18 (10/07 0609) BP: (133-169)/(83-89) 169/84 (10/07 0609) SpO2:  [95 %-100 %] 95 % (10/07 0609) Last BM Date: 09/30/18  Intake/Output from previous day: 10/06 0701 - 10/07 0700 In: 2882.5 [P.O.:240; I.V.:2642.5] Out: 2600 [Urine:2600] Intake/Output this shift: No intake/output data recorded.  PE: General--no acute distress  Abdomen--nontender  Lab Results: No results for input(s): WBC, HGB, HCT, PLT in the last 72 hours. BMET Recent Labs    09/30/18 0356 10/01/18 0444  NA 139 139  K 3.8 3.5  CL 103 100  CO2 24 28  CREATININE 0.60 0.70   LFT No results for input(s): PROT, AST, ALT, ALKPHOS, BILITOT, BILIDIR, IBILI in the last 72 hours. PT/INR No results for input(s): LABPROT, INR in the last 72 hours. PANCREAS No results for input(s): LIPASE in the last 72 hours.       Studies/Results: No results found.  Medications: I have reviewed the patient's current medications.  Assessment:   1.  Nausea and vomiting.  Shallow gastric ulcers on EGD biopsy still pending.  Patient states that she is quite a bit better.  Question if increasing her Protonix is the reason.   Plan: Would continue Protonix twice daily for the next several months.  She does clearly seem to be improving question if she will take in enough to remain hydrated etc.  We will check on the biopsies when available   Nancy Fetter 10/02/2018, 8:37 AM  This note was created using voice recognition software. Minor errors may Have occurred unintentionally.  Pager:  [phone] If no answer or after hours call [phone]

## 2018-10-16 ENCOUNTER — Inpatient Hospital Stay: Payer: Medicare Other

## 2018-10-16 ENCOUNTER — Telehealth: Payer: Self-pay | Admitting: Oncology

## 2018-10-16 ENCOUNTER — Inpatient Hospital Stay: Payer: Medicare Other | Attending: Oncology

## 2018-10-16 ENCOUNTER — Inpatient Hospital Stay (HOSPITAL_BASED_OUTPATIENT_CLINIC_OR_DEPARTMENT_OTHER): Payer: Medicare Other | Admitting: Oncology

## 2018-10-16 VITALS — BP 160/87 | HR 107 | Temp 98.1°F | Resp 18

## 2018-10-16 VITALS — HR 98

## 2018-10-16 DIAGNOSIS — K219 Gastro-esophageal reflux disease without esophagitis: Secondary | ICD-10-CM | POA: Insufficient documentation

## 2018-10-16 DIAGNOSIS — Z923 Personal history of irradiation: Secondary | ICD-10-CM

## 2018-10-16 DIAGNOSIS — R110 Nausea: Secondary | ICD-10-CM | POA: Insufficient documentation

## 2018-10-16 DIAGNOSIS — Z5111 Encounter for antineoplastic chemotherapy: Secondary | ICD-10-CM | POA: Insufficient documentation

## 2018-10-16 DIAGNOSIS — C781 Secondary malignant neoplasm of mediastinum: Secondary | ICD-10-CM | POA: Diagnosis not present

## 2018-10-16 DIAGNOSIS — Z79899 Other long term (current) drug therapy: Secondary | ICD-10-CM

## 2018-10-16 DIAGNOSIS — C7951 Secondary malignant neoplasm of bone: Secondary | ICD-10-CM

## 2018-10-16 DIAGNOSIS — C7800 Secondary malignant neoplasm of unspecified lung: Secondary | ICD-10-CM

## 2018-10-16 DIAGNOSIS — C679 Malignant neoplasm of bladder, unspecified: Secondary | ICD-10-CM | POA: Insufficient documentation

## 2018-10-16 DIAGNOSIS — Z95828 Presence of other vascular implants and grafts: Secondary | ICD-10-CM

## 2018-10-16 LAB — CBC WITH DIFFERENTIAL (CANCER CENTER ONLY)
Abs Immature Granulocytes: 0.02 10*3/uL (ref 0.00–0.07)
BASOS ABS: 0 10*3/uL (ref 0.0–0.1)
Basophils Relative: 1 %
EOS ABS: 0.4 10*3/uL (ref 0.0–0.5)
EOS PCT: 6 %
HEMATOCRIT: 33.2 % — AB (ref 36.0–46.0)
Hemoglobin: 10.7 g/dL — ABNORMAL LOW (ref 12.0–15.0)
IMMATURE GRANULOCYTES: 0 %
LYMPHS ABS: 0.9 10*3/uL (ref 0.7–4.0)
LYMPHS PCT: 15 %
MCH: 25.7 pg — ABNORMAL LOW (ref 26.0–34.0)
MCHC: 32.2 g/dL (ref 30.0–36.0)
MCV: 79.6 fL — ABNORMAL LOW (ref 80.0–100.0)
Monocytes Absolute: 0.7 10*3/uL (ref 0.1–1.0)
Monocytes Relative: 11 %
NEUTROS PCT: 67 %
NRBC: 0 % (ref 0.0–0.2)
Neutro Abs: 4.1 10*3/uL (ref 1.7–7.7)
Platelet Count: 252 10*3/uL (ref 150–400)
RBC: 4.17 MIL/uL (ref 3.87–5.11)
RDW: 14.1 % (ref 11.5–15.5)
WBC: 6.1 10*3/uL (ref 4.0–10.5)

## 2018-10-16 LAB — CMP (CANCER CENTER ONLY)
ALBUMIN: 3.1 g/dL — AB (ref 3.5–5.0)
ALT: 8 U/L (ref 0–44)
AST: 16 U/L (ref 15–41)
Alkaline Phosphatase: 102 U/L (ref 38–126)
Anion gap: 16 — ABNORMAL HIGH (ref 5–15)
BILIRUBIN TOTAL: 0.7 mg/dL (ref 0.3–1.2)
BUN: 10 mg/dL (ref 8–23)
CALCIUM: 10.2 mg/dL (ref 8.9–10.3)
CHLORIDE: 97 mmol/L — AB (ref 98–111)
CO2: 26 mmol/L (ref 22–32)
CREATININE: 0.75 mg/dL (ref 0.44–1.00)
GFR, Est AFR Am: >60 mL/min (ref >60)
GFR, Estimated: >60 mL/min (ref >60)
GLUCOSE: 114 mg/dL — AB (ref 70–99)
POTASSIUM: 3 mmol/L — AB (ref 3.5–5.1)
SODIUM: 139 mmol/L (ref 135–145)
Total Protein: 7.5 g/dL (ref 6.5–8.1)

## 2018-10-16 MED ORDER — SODIUM CHLORIDE 0.9% FLUSH
10.0000 mL | INTRAVENOUS | Status: DC | PRN
  Administered 2018-10-16: 10 mL
  Filled 2018-10-16: qty 10

## 2018-10-16 MED ORDER — DEXAMETHASONE SODIUM PHOSPHATE 10 MG/ML IJ SOLN
10.0000 mg | Freq: Once | INTRAMUSCULAR | Status: AC
  Administered 2018-10-16: 10 mg via INTRAVENOUS

## 2018-10-16 MED ORDER — SODIUM CHLORIDE 0.9 % IV SOLN
Freq: Once | INTRAVENOUS | Status: AC
  Administered 2018-10-16: 10:00:00 via INTRAVENOUS
  Filled 2018-10-16: qty 250

## 2018-10-16 MED ORDER — DEXAMETHASONE 4 MG PO TABS: 4.0000 mg | ORAL_TABLET | Freq: Two times a day (BID) | ORAL | 3 refills | Status: DC

## 2018-10-16 MED ORDER — SODIUM CHLORIDE 0.9 % IV SOLN
458.0000 mg | Freq: Once | INTRAVENOUS | Status: AC
  Administered 2018-10-16: 460 mg via INTRAVENOUS
  Filled 2018-10-16: qty 46

## 2018-10-16 MED ORDER — PALONOSETRON HCL INJECTION 0.25 MG/5ML
INTRAVENOUS | Status: AC
  Filled 2018-10-16: qty 5

## 2018-10-16 MED ORDER — ALTEPLASE 2 MG IJ SOLR
2.0000 mg | Freq: Once | INTRAMUSCULAR | Status: AC
  Administered 2018-10-16: 2 mg
  Filled 2018-10-16: qty 2

## 2018-10-16 MED ORDER — SODIUM CHLORIDE 0.9% FLUSH
10.0000 mL | Freq: Once | INTRAVENOUS | Status: AC
  Administered 2018-10-16: 10 mL
  Filled 2018-10-16: qty 10

## 2018-10-16 MED ORDER — HEPARIN SOD (PORK) LOCK FLUSH 100 UNIT/ML IV SOLN
500.0000 [IU] | Freq: Once | INTRAVENOUS | Status: AC | PRN
  Administered 2018-10-16: 500 [IU]
  Filled 2018-10-16: qty 5

## 2018-10-16 MED ORDER — SODIUM CHLORIDE 0.9 % IV SOLN
1000.0000 mg/m2 | Freq: Once | INTRAVENOUS | Status: AC
  Administered 2018-10-16: 1824 mg via INTRAVENOUS
  Filled 2018-10-16: qty 47.97

## 2018-10-16 MED ORDER — DEXAMETHASONE SODIUM PHOSPHATE 10 MG/ML IJ SOLN
INTRAMUSCULAR | Status: AC
  Filled 2018-10-16: qty 1

## 2018-10-16 MED ORDER — PALONOSETRON HCL INJECTION 0.25 MG/5ML
0.2500 mg | Freq: Once | INTRAVENOUS | Status: AC
  Administered 2018-10-16: 0.25 mg via INTRAVENOUS

## 2018-10-16 MED ORDER — ALTEPLASE 2 MG IJ SOLR
INTRAMUSCULAR | Status: AC
  Filled 2018-10-16: qty 2

## 2018-10-16 NOTE — Telephone Encounter (Signed)
Appts scheduled avs/calendar printed per 10/21 los °

## 2018-10-16 NOTE — Patient Instructions (Signed)
[facility] Discharge Instructions for Patients Receiving Chemotherapy  Today you received the following chemotherapy agents gemcitabine (Gemzar) and carboplatin (Paraplatin).  To help prevent nausea and vomiting after your treatment, we encourage you to take your nausea medication as directed by your physician.    If you develop nausea and vomiting that is not controlled by your nausea medication, call the clinic.   BELOW ARE SYMPTOMS THAT SHOULD BE REPORTED IMMEDIATELY:  *FEVER GREATER THAN 100.5 F  *CHILLS WITH OR WITHOUT FEVER  NAUSEA AND VOMITING THAT IS NOT CONTROLLED WITH YOUR NAUSEA MEDICATION  *UNUSUAL SHORTNESS OF BREATH  *UNUSUAL BRUISING OR BLEEDING  TENDERNESS IN MOUTH AND THROAT WITH OR WITHOUT PRESENCE OF ULCERS  *URINARY PROBLEMS  *BOWEL PROBLEMS  UNUSUAL RASH Items with * indicate a potential emergency and should be followed up as soon as possible.  Feel free to call the clinic should you have any questions or concerns. The clinic phone number is [phone].  Please show the [facility] at check-in to the Emergency Department and triage nurse.  Gemcitabine injection What is this medicine? GEMCITABINE (jem SIT a been) is a chemotherapy drug. This medicine is used to treat many types of cancer like breast cancer, lung cancer, pancreatic cancer, and ovarian cancer. This medicine may be used for other purposes; ask your health care provider or pharmacist if you have questions. COMMON BRAND NAME(S): Gemzar What should I tell my health care provider before I take this medicine? They need to know if you have any of these conditions: -blood disorders -infection -kidney disease -liver disease -recent or ongoing radiation therapy -an unusual or allergic reaction to gemcitabine, other chemotherapy, other medicines, foods, dyes, or preservatives -pregnant or trying to get pregnant -breast-feeding How should I use this medicine? This drug  is given as an infusion into a vein. It is administered in a hospital or clinic by a specially trained health care professional. Talk to your pediatrician regarding the use of this medicine in children. Special care may be needed. Overdosage: If you think you have taken too much of this medicine contact a poison control center or emergency room at once. NOTE: This medicine is only for you. Do not share this medicine with others. What if I miss a dose? It is important not to miss your dose. Call your doctor or health care professional if you are unable to keep an appointment. What may interact with this medicine? -medicines to increase blood counts like filgrastim, pegfilgrastim, sargramostim -some other chemotherapy drugs like cisplatin -vaccines Talk to your doctor or health care professional before taking any of these medicines: -acetaminophen -aspirin -ibuprofen -ketoprofen -naproxen This list may not describe all possible interactions. Give your health care provider a list of all the medicines, herbs, non-prescription drugs, or dietary supplements you use. Also tell them if you smoke, drink alcohol, or use illegal drugs. Some items may interact with your medicine. What should I watch for while using this medicine? Visit your doctor for checks on your progress. This drug may make you feel generally unwell. This is not uncommon, as chemotherapy can affect healthy cells as well as cancer cells. Report any side effects. Continue your course of treatment even though you feel ill unless your doctor tells you to stop. In some cases, you may be given additional medicines to help with side effects. Follow all directions for their use. Call your doctor or health care professional for advice if you get a fever, chills or sore  throat, or other symptoms of a cold or flu. Do not treat yourself. This drug decreases your body's ability to fight infections. Try to avoid being around people who are sick. This  medicine may increase your risk to bruise or bleed. Call your doctor or health care professional if you notice any unusual bleeding. Be careful brushing and flossing your teeth or using a toothpick because you may get an infection or bleed more easily. If you have any dental work done, tell your dentist you are receiving this medicine. Avoid taking products that contain aspirin, acetaminophen, ibuprofen, naproxen, or ketoprofen unless instructed by your doctor. These medicines may hide a fever. Women should inform their doctor if they wish to become pregnant or think they might be pregnant. There is a potential for serious side effects to an unborn child. Talk to your health care professional or pharmacist for more information. Do not breast-feed an infant while taking this medicine. What side effects may I notice from receiving this medicine? Side effects that you should report to your doctor or health care professional as soon as possible: -allergic reactions like skin rash, itching or hives, swelling of the face, lips, or tongue -low blood counts - this medicine may decrease the number of white blood cells, red blood cells and platelets. You may be at increased risk for infections and bleeding. -signs of infection - fever or chills, cough, sore throat, pain or difficulty passing urine -signs of decreased platelets or bleeding - bruising, pinpoint red spots on the skin, black, tarry stools, blood in the urine -signs of decreased red blood cells - unusually weak or tired, fainting spells, lightheadedness -breathing problems -chest pain -mouth sores -nausea and vomiting -pain, swelling, redness at site where injected -pain, tingling, numbness in the hands or feet -stomach pain -swelling of ankles, feet, hands -unusual bleeding Side effects that usually do not require medical attention (report to your doctor or health care professional if they continue or are  bothersome): -constipation -diarrhea -hair loss -loss of appetite -stomach upset This list may not describe all possible side effects. Call your doctor for medical advice about side effects. You may report side effects to FDA at 1-800-FDA-1088. Where should I keep my medicine? This drug is given in a hospital or clinic and will not be stored at home. NOTE: This sheet is a summary. It may not cover all possible information. If you have questions about this medicine, talk to your doctor, pharmacist, or health care provider.  2018 Elsevier/Gold Standard (2008-04-23 18:45:54)  Carboplatin injection What is this medicine? CARBOPLATIN (KAR boe pla tin) is a chemotherapy drug. It targets fast dividing cells, like cancer cells, and causes these cells to die. This medicine is used to treat ovarian cancer and many other cancers. This medicine may be used for other purposes; ask your health care provider or pharmacist if you have questions. COMMON BRAND NAME(S): Paraplatin What should I tell my health care provider before I take this medicine? They need to know if you have any of these conditions: -blood disorders -hearing problems -kidney disease -recent or ongoing radiation therapy -an unusual or allergic reaction to carboplatin, cisplatin, other chemotherapy, other medicines, foods, dyes, or preservatives -pregnant or trying to get pregnant -breast-feeding How should I use this medicine? This drug is usually given as an infusion into a vein. It is administered in a hospital or clinic by a specially trained health care professional. Talk to your pediatrician regarding the use of this medicine in children.  Special care may be needed. Overdosage: If you think you have taken too much of this medicine contact a poison control center or emergency room at once. NOTE: This medicine is only for you. Do not share this medicine with others. What if I miss a dose? It is important not to miss a dose.  Call your doctor or health care professional if you are unable to keep an appointment. What may interact with this medicine? -medicines for seizures -medicines to increase blood counts like filgrastim, pegfilgrastim, sargramostim -some antibiotics like amikacin, gentamicin, neomycin, streptomycin, tobramycin -vaccines Talk to your doctor or health care professional before taking any of these medicines: -acetaminophen -aspirin -ibuprofen -ketoprofen -naproxen This list may not describe all possible interactions. Give your health care provider a list of all the medicines, herbs, non-prescription drugs, or dietary supplements you use. Also tell them if you smoke, drink alcohol, or use illegal drugs. Some items may interact with your medicine. What should I watch for while using this medicine? Your condition will be monitored carefully while you are receiving this medicine. You will need important blood work done while you are taking this medicine. This drug may make you feel generally unwell. This is not uncommon, as chemotherapy can affect healthy cells as well as cancer cells. Report any side effects. Continue your course of treatment even though you feel ill unless your doctor tells you to stop. In some cases, you may be given additional medicines to help with side effects. Follow all directions for their use. Call your doctor or health care professional for advice if you get a fever, chills or sore throat, or other symptoms of a cold or flu. Do not treat yourself. This drug decreases your body's ability to fight infections. Try to avoid being around people who are sick. This medicine may increase your risk to bruise or bleed. Call your doctor or health care professional if you notice any unusual bleeding. Be careful brushing and flossing your teeth or using a toothpick because you may get an infection or bleed more easily. If you have any dental work done, tell your dentist you are receiving this  medicine. Avoid taking products that contain aspirin, acetaminophen, ibuprofen, naproxen, or ketoprofen unless instructed by your doctor. These medicines may hide a fever. Do not become pregnant while taking this medicine. Women should inform their doctor if they wish to become pregnant or think they might be pregnant. There is a potential for serious side effects to an unborn child. Talk to your health care professional or pharmacist for more information. Do not breast-feed an infant while taking this medicine. What side effects may I notice from receiving this medicine? Side effects that you should report to your doctor or health care professional as soon as possible: -allergic reactions like skin rash, itching or hives, swelling of the face, lips, or tongue -signs of infection - fever or chills, cough, sore throat, pain or difficulty passing urine -signs of decreased platelets or bleeding - bruising, pinpoint red spots on the skin, black, tarry stools, nosebleeds -signs of decreased red blood cells - unusually weak or tired, fainting spells, lightheadedness -breathing problems -changes in hearing -changes in vision -chest pain -high blood pressure -low blood counts - This drug may decrease the number of white blood cells, red blood cells and platelets. You may be at increased risk for infections and bleeding. -nausea and vomiting -pain, swelling, redness or irritation at the injection site -pain, tingling, numbness in the hands or feet -problems  with balance, talking, walking -trouble passing urine or change in the amount of urine Side effects that usually do not require medical attention (report to your doctor or health care professional if they continue or are bothersome): -hair loss -loss of appetite -metallic taste in the mouth or changes in taste This list may not describe all possible side effects. Call your doctor for medical advice about side effects. You may report side effects to  FDA at 1-800-FDA-1088. Where should I keep my medicine? This drug is given in a hospital or clinic and will not be stored at home. NOTE: This sheet is a summary. It may not cover all possible information. If you have questions about this medicine, talk to your doctor, pharmacist, or health care provider.  2018 Elsevier/Gold Standard (2008-03-19 14:38:05)

## 2018-10-16 NOTE — Progress Notes (Signed)
Pt's K at 3.0. Per Dr. Alen Blew, will supplement K later this week when pt comes back to receive fluids. Pt VSS, will continue to follow.

## 2018-10-16 NOTE — Progress Notes (Signed)
Hematology and Oncology Follow Up Visit  Faith [name] [id number] [birth year] 67 y.o. 10/16/2018 8:49 AM Patient, No Pcp Paulino Door, MD   Principle Diagnosis: 67 year old woman with stage IV high-grade urothelial carcinoma arising from the bladder with metastatic disease to the bone, mediastinal adenopathy as well as the lung diagnosed in August 2019.    Prior Therapy: She is status post transurethral resection of a bladder tumor in the pathology revealed invasive high-grade urothelial carcinoma without any muscle invasion.  She had a repeat cystoscopy in February 2018 as well as in August 2018 without any evidence of recurrent disease.  She is status post radiation therapy to the thoracic and lumbar spine completed in September 2019.  Current therapy:   Gemcitabine and cisplatin therapy started on 08/31/2018.  She missed day 8 of cycle 1 because of hospitalization. Therapy switched to gemcitabine and carboplatin with cycle 1 scheduled for 10/16/2018.  Interim History: [name] returns today for a repeat evaluation.  Since last visit, she was hospitalized in early October 2019 for complaints of nausea and vomiting.  She had a complete GI evaluation including endoscopy, ultrasound of the abdomen as well as CT scan of the head without any clear-cut etiology.  Despite aggressive antiemetics, she continues to have this issue.  She is able to maintain adequate hydration and take soft food intermittently.  He denies any abdominal pains, headaches or abdominal distention.  Her performance status is limited but not dramatically changed.  She does ambulate short distances at home.  She denies any falls or worsening pain.  She continues fentanyl patch with adequate pain control.  She does not report any headaches, blurry vision, syncope or seizures.  No alteration in mental status or confusion.  Does not report any fevers, chills or sweats.  Does not report any cough, wheezing or hemoptysis.   Does not report any chest pain, palpitation, orthopnea or leg edema.   Does not report any changes in bowel habits.  Does not report any arthralgias or myalgias.   Does not report frequency, urgency or hematuria.  Does not report any skin rashes or lesions.  Does not report any lymphadenopathy or petechiae.  Does not report any mood changes.  Remaining review of systems is negative.    Medications: I have reviewed the patient's current medications.  Current Outpatient Medications  Medication Sig Dispense Refill  . acetaminophen (TYLENOL) 325 MG tablet Take 650 mg by mouth every 6 (six) hours as needed for mild pain, moderate pain or headache.    Marland Kitchen amLODipine (NORVASC) 10 MG tablet Take 1 tablet (10 mg total) by mouth every morning. 90 tablet 0  . Docusate Calcium (STOOL SOFTENER PO) Take 2 tablets by mouth daily as needed (constipation).    . fentaNYL (DURAGESIC - DOSED MCG/HR) 25 MCG/HR patch Place 1 patch (25 mcg total) onto the skin every 3 (three) days. 5 patch 0  . ferrous sulfate 325 (65 FE) MG tablet Start taking only after constipation is relieved. Take 1 tablet once daily for 1 week, then 1 tablet twice daily for 1 week and then 1 tablet three times daily. (Patient taking differently: Take 325 mg by mouth daily. ) 90 tablet 3  . lidocaine-prilocaine (EMLA) cream Apply 1 application topically as needed. (Patient taking differently: Apply 1 application topically as needed (port). ) 30 g 0  . LORazepam (ATIVAN) 0.5 MG tablet Take 1 tablet (0.5 mg total) by mouth every 4 (four) hours as needed for anxiety. 60 tablet  0  . metoprolol succinate (TOPROL-XL) 100 MG 24 hr tablet Take 100 mg by mouth daily.  1  . montelukast (SINGULAIR) 10 MG tablet Take 10 mg by mouth daily.  3  . olmesartan (BENICAR) 40 MG tablet Take 40 mg by mouth daily.  0  . ondansetron (ZOFRAN-ODT) 4 MG disintegrating tablet Take 1-2 tablets (4-8 mg total) by mouth every 8 (eight) hours as needed for nausea. 30 tablet 3  .  oxyCODONE-acetaminophen (PERCOCET/ROXICET) 5-325 MG tablet Take 1 tablet by mouth every 6 (six) hours as needed (for breakthrough pain). 30 tablet 0  . pantoprazole (PROTONIX) 40 MG tablet Take 1 tablet (40 mg total) by mouth 2 (two) times daily before a meal. 60 tablet 3  . polyethylene glycol (MIRALAX / GLYCOLAX) packet Take 17 g by mouth daily. 30 each 2  . prochlorperazine (COMPAZINE) 10 MG tablet Take 1 tablet (10 mg total) by mouth every 6 (six) hours as needed for nausea or vomiting. 30 tablet 0  . rosuvastatin (CRESTOR) 5 MG tablet Take 5 mg by mouth daily.  3  . senna-docusate (SENOKOT-S) 8.6-50 MG tablet Take 1 tablet by mouth daily as needed. (Patient taking differently: Take 1 tablet by mouth daily as needed for mild constipation or moderate constipation. ) 30 tablet 2  . dexamethasone (DECADRON) 4 MG tablet Take 1 tablet (4 mg total) by mouth 2 (two) times daily with a meal. Take it for 3 days after chemotherapy. 40 tablet 3   No current facility-administered medications for this visit.      Allergies:  Allergies  Allergen Reactions  . Tramadol Nausea And Vomiting    Past Medical History, Surgical history, Social history, and Family History were reviewed and updated.    Physical Exam:  Blood pressure (!) 160/87, pulse (!) 107, temperature 98.1 F (36.7 C), temperature source Oral, resp. rate 18, SpO2 100 %.   ECOG: 1   General appearance: Comfortable appearing without any discomfort Head: Normocephalic without any trauma Oropharynx: Mucous membranes are moist and pink without any thrush or ulcers. Eyes: Pupils are equal and round reactive to light. Lymph nodes: No cervical, supraclavicular, inguinal or axillary lymphadenopathy.   Heart:regular rate and rhythm.  S1 and S2 without leg edema. Lung: Clear without any rhonchi or wheezes.  No dullness to percussion. Abdomin: Soft, nontender, nondistended with good bowel sounds.  No hepatosplenomegaly. Musculoskeletal: No  joint deformity or effusion.  Full range of motion noted. Neurological: No deficits noted on motor, sensory and deep tendon reflex exam. Skin: No petechial rash or dryness.  Appeared moist.      Lab Results: Lab Results  Component Value Date   WBC 6.1 10/16/2018   HGB 10.7 (L) 10/16/2018   HCT 33.2 (L) 10/16/2018   MCV 79.6 (L) 10/16/2018   PLT 252 10/16/2018     Chemistry      Component Value Date/Time   NA 139 10/01/2018 0444   K 3.5 10/01/2018 0444   CL 100 10/01/2018 0444   CO2 28 10/01/2018 0444   BUN <5 (L) 10/01/2018 0444   CREATININE 0.70 10/01/2018 0444   CREATININE 0.68 09/21/2018 0905      Component Value Date/Time   CALCIUM 9.3 10/01/2018 0444   ALKPHOS 82 09/28/2018 0423   AST 19 09/28/2018 0423   AST 17 09/21/2018 0905   ALT 12 09/28/2018 0423   ALT 11 09/21/2018 0905   BILITOT 1.0 09/28/2018 0423   BILITOT 0.3 09/21/2018 0905  Impression and Plan:  67 year old woman with:  1.    Stage IV high-grade urothelial carcinoma arising from the bladder with disease to the bone, adenopathy as well as the lung documented in August 2019.    She received day 1 of cycle 1 of gemcitabine and cisplatin and tolerated it poorly.  Despite not receiving chemotherapy for few weeks continues to have issues with nausea and vomiting.  Options of therapy was reviewed today with the patient and her family.  His options to proceed with carboplatin based regimen which should be more tolerated versus supportive care and hospice care only.  He understands that with carboplatin based chemotherapy she is still at risk of developing worsening nausea but I fear that her disease will progress without any treatment.  After discussion today, she is agreeable to proceed with carboplatin-based regimen with maximal antiemetic support.  She will receive day 1 only without day 8 gemcitabine.  2.  IV access: Cath remains in place and she will receive chemotherapy using her  Port-A-Cath.  3.    Nausea: Continues to be an issue despite not being on chemotherapy for many weeks.  Her nausea could be related to disease progression and it is important to treat her underlying disease.  The plan is to proceed with anti-emetics utilizing dexamethasone and Aloxi and use dexamethasone at 4 mg twice a day for 3 days.  I will also schedule for intravenous hydration weekly for the next 3 weeks to receive IV fluids as well as antiemetics.  4.  Thoracic spine metastasis: No recurrence of the tumor noted at this time.  5.  Pain: He is currently on fentanyl patch with control of her pain.  6.  Bone directed therapy: This option is deferred at this time to her other issues recover.  7.  Prognosis and goals of care: Therapy remains palliative and goals of care were reiterated again today.  The goals are to palliate her symptoms as best as we can moving forward.  8.  Follow-up: In 3 weeks for cycle 2 of chemotherapy.  30  minutes was spent with the patient face-to-face today.  More than 50% of time was dedicated to reviewing her disease status, treatment options and coordinating plan of care.    Zola Button, MD 10/21/20198:49 AM

## 2018-10-20 ENCOUNTER — Inpatient Hospital Stay (HOSPITAL_BASED_OUTPATIENT_CLINIC_OR_DEPARTMENT_OTHER): Payer: Medicare Other | Admitting: Medical

## 2018-10-20 ENCOUNTER — Other Ambulatory Visit: Payer: Self-pay

## 2018-10-20 ENCOUNTER — Other Ambulatory Visit: Payer: Self-pay | Admitting: Medical

## 2018-10-20 ENCOUNTER — Inpatient Hospital Stay: Payer: Medicare Other

## 2018-10-20 VITALS — BP 168/90 | HR 98 | Temp 98.8°F | Resp 18

## 2018-10-20 DIAGNOSIS — C679 Malignant neoplasm of bladder, unspecified: Secondary | ICD-10-CM

## 2018-10-20 DIAGNOSIS — Z95828 Presence of other vascular implants and grafts: Secondary | ICD-10-CM

## 2018-10-20 DIAGNOSIS — K219 Gastro-esophageal reflux disease without esophagitis: Secondary | ICD-10-CM

## 2018-10-20 DIAGNOSIS — C7951 Secondary malignant neoplasm of bone: Secondary | ICD-10-CM

## 2018-10-20 DIAGNOSIS — E860 Dehydration: Secondary | ICD-10-CM

## 2018-10-20 DIAGNOSIS — R110 Nausea: Secondary | ICD-10-CM

## 2018-10-20 DIAGNOSIS — C781 Secondary malignant neoplasm of mediastinum: Secondary | ICD-10-CM

## 2018-10-20 DIAGNOSIS — Z79899 Other long term (current) drug therapy: Secondary | ICD-10-CM

## 2018-10-20 DIAGNOSIS — C7800 Secondary malignant neoplasm of unspecified lung: Secondary | ICD-10-CM

## 2018-10-20 MED ORDER — ALUM & MAG HYDROXIDE-SIMETH 200-200-20 MG/5ML PO SUSP: 30.0000 mL | Freq: Once | ORAL | Status: DC

## 2018-10-20 MED ORDER — LIDOCAINE VISCOUS HCL 2 % MT SOLN
15.0000 mL | Freq: Once | OROMUCOSAL | Status: DC
  Filled 2018-10-20: qty 15

## 2018-10-20 MED ORDER — HEPARIN SOD (PORK) LOCK FLUSH 100 UNIT/ML IV SOLN
500.0000 [IU] | Freq: Once | INTRAVENOUS | Status: AC
  Administered 2018-10-20: 500 [IU]
  Filled 2018-10-20: qty 5

## 2018-10-20 MED ORDER — SODIUM CHLORIDE 0.9% FLUSH
10.0000 mL | Freq: Once | INTRAVENOUS | Status: AC
  Administered 2018-10-20: 10 mL
  Filled 2018-10-20: qty 10

## 2018-10-20 MED ORDER — LORAZEPAM 1 MG PO TABS
1.0000 mg | ORAL_TABLET | Freq: Once | ORAL | Status: AC
  Administered 2018-10-20: 1 mg via ORAL

## 2018-10-20 MED ORDER — OMEPRAZOLE 40 MG PO CPDR: 40.0000 mg | DELAYED_RELEASE_CAPSULE | Freq: Every day | ORAL | 5 refills | Status: DC

## 2018-10-20 MED ORDER — ALUM & MAG HYDROXIDE-SIMETH 200-200-20 MG/5ML PO SUSP
ORAL | Status: AC
  Filled 2018-10-20: qty 30

## 2018-10-20 MED ORDER — ONDANSETRON HCL 4 MG/2ML IJ SOLN
INTRAMUSCULAR | Status: AC
  Filled 2018-10-20: qty 4

## 2018-10-20 MED ORDER — SODIUM CHLORIDE 0.9 % IV SOLN
Freq: Once | INTRAVENOUS | Status: AC
  Administered 2018-10-20: 11:00:00 via INTRAVENOUS
  Filled 2018-10-20: qty 250

## 2018-10-20 MED ORDER — LORAZEPAM 1 MG PO TABS
ORAL_TABLET | ORAL | Status: AC
  Filled 2018-10-20: qty 1

## 2018-10-20 MED ORDER — ONDANSETRON HCL 4 MG/2ML IJ SOLN
8.0000 mg | Freq: Once | INTRAMUSCULAR | Status: AC
  Administered 2018-10-20: 8 mg via INTRAVENOUS

## 2018-10-20 MED ORDER — ALUM & MAG HYDROXIDE-SIMETH 200-200-20 MG/5ML PO SUSP
30.0000 mL | Freq: Once | ORAL | Status: AC
  Administered 2018-10-20: 30 mL via ORAL

## 2018-10-20 MED ORDER — LIDOCAINE VISCOUS HCL 2 % MT SOLN: 15.0000 mL | Freq: Once | OROMUCOSAL | Status: DC

## 2018-10-20 NOTE — Progress Notes (Signed)
Pt. Tolerated infusion well, But stated she still feels nausea. TC to Dr. Alen Blew. Per Dr. Alen Blew 8mg  of zofran given, Pt. will see Sandi Mealy PA. In infusion. Lucianne Lei saw Pt. Ativan and GI cocktail ordered. Pt. Stated she felt a little better VSS Pt. Left [facility] in wheelchair accompanied by her brother.

## 2018-10-20 NOTE — Patient Instructions (Addendum)
Dehydration, Adult Dehydration is when there is not enough fluid or water in your body. This happens when you lose more fluids than you take in. Dehydration can range from mild to very bad. It should be treated right away to keep it from getting very bad. Symptoms of mild dehydration may include:  Thirst.  Dry lips.  Slightly dry mouth.  Dry, warm skin.  Dizziness. Symptoms of moderate dehydration may include:  Very dry mouth.  Muscle cramps.  Dark pee (urine). Pee may be the color of tea.  Your body making less pee.  Your eyes making fewer tears.  Heartbeat that is uneven or faster than normal (palpitations).  Headache.  Light-headedness, especially when you stand up from sitting.  Fainting (syncope). Symptoms of very bad dehydration may include:  Changes in skin, such as: ? Cold and clammy skin. ? Blotchy (mottled) or pale skin. ? Skin that does not quickly return to normal after being lightly pinched and let go (poor skin turgor).  Changes in body fluids, such as: ? Feeling very thirsty. ? Your eyes making fewer tears. ? Not sweating when body temperature is high, such as in hot weather. ? Your body making very little pee.  Changes in vital signs, such as: ? Weak pulse. ? Pulse that is more than 100 beats a minute when you are sitting still. ? Fast breathing. ? Low blood pressure.  Other changes, such as: ? Sunken eyes. ? Cold hands and feet. ? Confusion. ? Lack of energy (lethargy). ? Trouble waking up from sleep. ? Short-term weight loss. ? Unconsciousness. Follow these instructions at home:  If told by your doctor, drink an ORS: ? Make an ORS by using instructions on the package. ? Start by drinking small amounts, about  cup (120 mL) every 5-10 minutes. ? Slowly drink more until you have had the amount that your doctor said to have.  Drink enough clear fluid to keep your pee clear or pale yellow. If you were told to drink an ORS, finish the ORS  first, then start slowly drinking clear fluids. Drink fluids such as: ? Water. Do not drink only water by itself. Doing that can make the salt (sodium) level in your body get too low (hyponatremia). ? Ice chips. ? Fruit juice that you have added water to (diluted). ? Low-calorie sports drinks.  Avoid: ? Alcohol. ? Drinks that have a lot of sugar. These include high-calorie sports drinks, fruit juice that does not have water added, and soda. ? Caffeine. ? Foods that are greasy or have a lot of fat or sugar.  Take over-the-counter and prescription medicines only as told by your doctor.  Do not take salt tablets. Doing that can make the salt level in your body get too high (hypernatremia).  Eat foods that have minerals (electrolytes). Examples include bananas, oranges, potatoes, tomatoes, and spinach.  Keep all follow-up visits as told by your doctor. This is important. Contact a doctor if:  You have belly (abdominal) pain that: ? Gets worse. ? Stays in one area (localizes).  You have a rash.  You have a stiff neck.  You get angry or annoyed more easily than normal (irritability).  You are more sleepy than normal.  You have a harder time waking up than normal.  You feel: ? Weak. ? Dizzy. ? Very thirsty.  You have peed (urinated) only a small amount of very dark pee during 6-8 hours. Get help right away if:  You have symptoms of   very bad dehydration.  You cannot drink fluids without throwing up (vomiting).  Your symptoms get worse with treatment.  You have a fever.  You have a very bad headache.  You are throwing up or having watery poop (diarrhea) and it: ? Gets worse. ? Does not go away.  You have blood or something green (bile) in your throw-up.  You have blood in your poop (stool). This may cause poop to look black and tarry.  You have not peed in 6-8 hours.  You pass out (faint).  Your heart rate when you are sitting still is more than 100 beats a  minute.  You have trouble breathing. This information is not intended to replace advice given to you by your health care provider. Make sure you discuss any questions you have with your health care provider. Document Released: 10/09/2009 Document Revised: 07/02/2016 Document Reviewed: 02/06/2016 Elsevier Interactive Patient Education  2018 Elsevier Inc.  Dehydration, Adult Dehydration is when there is not enough fluid or water in your body. This happens when you lose more fluids than you take in. Dehydration can range from mild to very bad. It should be treated right away to keep it from getting very bad. Symptoms of mild dehydration may include:  Thirst.  Dry lips.  Slightly dry mouth.  Dry, warm skin.  Dizziness. Symptoms of moderate dehydration may include:  Very dry mouth.  Muscle cramps.  Dark pee (urine). Pee may be the color of tea.  Your body making less pee.  Your eyes making fewer tears.  Heartbeat that is uneven or faster than normal (palpitations).  Headache.  Light-headedness, especially when you stand up from sitting.  Fainting (syncope). Symptoms of very bad dehydration may include:  Changes in skin, such as: ? Cold and clammy skin. ? Blotchy (mottled) or pale skin. ? Skin that does not quickly return to normal after being lightly pinched and let go (poor skin turgor).  Changes in body fluids, such as: ? Feeling very thirsty. ? Your eyes making fewer tears. ? Not sweating when body temperature is high, such as in hot weather. ? Your body making very little pee.  Changes in vital signs, such as: ? Weak pulse. ? Pulse that is more than 100 beats a minute when you are sitting still. ? Fast breathing. ? Low blood pressure.  Other changes, such as: ? Sunken eyes. ? Cold hands and feet. ? Confusion. ? Lack of energy (lethargy). ? Trouble waking up from sleep. ? Short-term weight loss. ? Unconsciousness. Follow these instructions at  home:  If told by your doctor, drink an ORS: ? Make an ORS by using instructions on the package. ? Start by drinking small amounts, about  cup (120 mL) every 5-10 minutes. ? Slowly drink more until you have had the amount that your doctor said to have.  Drink enough clear fluid to keep your pee clear or pale yellow. If you were told to drink an ORS, finish the ORS first, then start slowly drinking clear fluids. Drink fluids such as: ? Water. Do not drink only water by itself. Doing that can make the salt (sodium) level in your body get too low (hyponatremia). ? Ice chips. ? Fruit juice that you have added water to (diluted). ? Low-calorie sports drinks.  Avoid: ? Alcohol. ? Drinks that have a lot of sugar. These include high-calorie sports drinks, fruit juice that does not have water added, and soda. ? Caffeine. ? Foods that are greasy or have   a lot of fat or sugar.  Take over-the-counter and prescription medicines only as told by your doctor.  Do not take salt tablets. Doing that can make the salt level in your body get too high (hypernatremia).  Eat foods that have minerals (electrolytes). Examples include bananas, oranges, potatoes, tomatoes, and spinach.  Keep all follow-up visits as told by your doctor. This is important. Contact a doctor if:  You have belly (abdominal) pain that: ? Gets worse. ? Stays in one area (localizes).  You have a rash.  You have a stiff neck.  You get angry or annoyed more easily than normal (irritability).  You are more sleepy than normal.  You have a harder time waking up than normal.  You feel: ? Weak. ? Dizzy. ? Very thirsty.  You have peed (urinated) only a small amount of very dark pee during 6-8 hours. Get help right away if:  You have symptoms of very bad dehydration.  You cannot drink fluids without throwing up (vomiting).  Your symptoms get worse with treatment.  You have a fever.  You have a very bad headache.  You  are throwing up or having watery poop (diarrhea) and it: ? Gets worse. ? Does not go away.  You have blood or something green (bile) in your throw-up.  You have blood in your poop (stool). This may cause poop to look black and tarry.  You have not peed in 6-8 hours.  You pass out (faint).  Your heart rate when you are sitting still is more than 100 beats a minute.  You have trouble breathing. This information is not intended to replace advice given to you by your health care provider. Make sure you discuss any questions you have with your health care provider. Document Released: 10/09/2009 Document Revised: 07/02/2016 Document Reviewed: 02/06/2016 Elsevier Interactive Patient Education  2018 Elsevier Inc.  

## 2018-10-26 ENCOUNTER — Encounter: Payer: Self-pay | Admitting: Radiation Oncology

## 2018-10-26 NOTE — Progress Notes (Signed)
  Radiation Oncology         [phone] ________________________________  Name: Faith [name] MRN: [id number]  Date: 10/26/2018  DOB: 1951/04/06  End of Treatment Note  Diagnosis:   Stage IV Carcinoma of bladder metastatic to bone [facility]).     Indication for treatment:  palliative       Radiation treatment dates:   08/31/2018 - 09/20/2018  Site/dose:   T5-8 and L1-2 were treated to 37.5 Gy in 15 fractions using a 2 field Complex Isodose technique.  Narrative: The patient tolerated radiation treatment well.  There were no signs of acute toxicity after treatment.  Plan: The patient has completed radiation treatment. The patient will return to radiation oncology clinic for routine followup in one month. I advised the patient to call or return sooner if they have any questions or concerns related to their recovery or treatment. ________________________________  ------------------------------------------------  Jodelle Gross, MD, PhD   This document serves as a record of services personally performed by Kyung Rudd, MD. It was created on his behalf by Wilburn Mylar, a trained medical scribe. The creation of this record is based on the scribe's personal observations and the provider's statements to them. This document has been checked and approved by the attending provider.

## 2018-10-27 ENCOUNTER — Other Ambulatory Visit: Payer: Self-pay

## 2018-10-27 ENCOUNTER — Other Ambulatory Visit: Payer: Self-pay | Admitting: Oncology

## 2018-10-27 ENCOUNTER — Inpatient Hospital Stay: Payer: Medicare Other | Attending: Oncology

## 2018-10-27 VITALS — BP 173/89 | HR 96 | Temp 98.2°F | Resp 18

## 2018-10-27 DIAGNOSIS — K5900 Constipation, unspecified: Secondary | ICD-10-CM | POA: Diagnosis not present

## 2018-10-27 DIAGNOSIS — E785 Hyperlipidemia, unspecified: Secondary | ICD-10-CM | POA: Diagnosis not present

## 2018-10-27 DIAGNOSIS — M549 Dorsalgia, unspecified: Secondary | ICD-10-CM | POA: Diagnosis present

## 2018-10-27 DIAGNOSIS — Z86718 Personal history of other venous thrombosis and embolism: Secondary | ICD-10-CM | POA: Diagnosis not present

## 2018-10-27 DIAGNOSIS — D61818 Other pancytopenia: Secondary | ICD-10-CM | POA: Diagnosis not present

## 2018-10-27 DIAGNOSIS — M6281 Muscle weakness (generalized): Secondary | ICD-10-CM | POA: Diagnosis not present

## 2018-10-27 DIAGNOSIS — K279 Peptic ulcer, site unspecified, unspecified as acute or chronic, without hemorrhage or perforation: Secondary | ICD-10-CM | POA: Diagnosis not present

## 2018-10-27 DIAGNOSIS — K7689 Other specified diseases of liver: Secondary | ICD-10-CM | POA: Diagnosis not present

## 2018-10-27 DIAGNOSIS — C779 Secondary and unspecified malignant neoplasm of lymph node, unspecified: Secondary | ICD-10-CM | POA: Diagnosis present

## 2018-10-27 DIAGNOSIS — C7951 Secondary malignant neoplasm of bone: Secondary | ICD-10-CM

## 2018-10-27 DIAGNOSIS — Z66 Do not resuscitate: Secondary | ICD-10-CM | POA: Diagnosis present

## 2018-10-27 DIAGNOSIS — K429 Umbilical hernia without obstruction or gangrene: Secondary | ICD-10-CM | POA: Diagnosis not present

## 2018-10-27 DIAGNOSIS — R112 Nausea with vomiting, unspecified: Secondary | ICD-10-CM | POA: Diagnosis not present

## 2018-10-27 DIAGNOSIS — K259 Gastric ulcer, unspecified as acute or chronic, without hemorrhage or perforation: Secondary | ICD-10-CM | POA: Diagnosis present

## 2018-10-27 DIAGNOSIS — E440 Moderate protein-calorie malnutrition: Secondary | ICD-10-CM | POA: Diagnosis not present

## 2018-10-27 DIAGNOSIS — C679 Malignant neoplasm of bladder, unspecified: Secondary | ICD-10-CM | POA: Diagnosis not present

## 2018-10-27 DIAGNOSIS — Z23 Encounter for immunization: Secondary | ICD-10-CM | POA: Diagnosis present

## 2018-10-27 DIAGNOSIS — I82411 Acute embolism and thrombosis of right femoral vein: Secondary | ICD-10-CM | POA: Diagnosis not present

## 2018-10-27 DIAGNOSIS — Z79899 Other long term (current) drug therapy: Secondary | ICD-10-CM | POA: Diagnosis not present

## 2018-10-27 DIAGNOSIS — K219 Gastro-esophageal reflux disease without esophagitis: Secondary | ICD-10-CM | POA: Diagnosis present

## 2018-10-27 DIAGNOSIS — G893 Neoplasm related pain (acute) (chronic): Secondary | ICD-10-CM | POA: Diagnosis present

## 2018-10-27 DIAGNOSIS — F419 Anxiety disorder, unspecified: Secondary | ICD-10-CM | POA: Diagnosis not present

## 2018-10-27 DIAGNOSIS — Z95828 Presence of other vascular implants and grafts: Secondary | ICD-10-CM

## 2018-10-27 DIAGNOSIS — I10 Essential (primary) hypertension: Secondary | ICD-10-CM | POA: Diagnosis not present

## 2018-10-27 DIAGNOSIS — D638 Anemia in other chronic diseases classified elsewhere: Secondary | ICD-10-CM | POA: Diagnosis not present

## 2018-10-27 DIAGNOSIS — E876 Hypokalemia: Secondary | ICD-10-CM | POA: Diagnosis not present

## 2018-10-27 DIAGNOSIS — C787 Secondary malignant neoplasm of liver and intrahepatic bile duct: Secondary | ICD-10-CM | POA: Diagnosis not present

## 2018-10-27 DIAGNOSIS — I82409 Acute embolism and thrombosis of unspecified deep veins of unspecified lower extremity: Secondary | ICD-10-CM | POA: Diagnosis not present

## 2018-10-27 DIAGNOSIS — D61810 Antineoplastic chemotherapy induced pancytopenia: Secondary | ICD-10-CM | POA: Diagnosis present

## 2018-10-27 DIAGNOSIS — C7800 Secondary malignant neoplasm of unspecified lung: Secondary | ICD-10-CM | POA: Diagnosis present

## 2018-10-27 DIAGNOSIS — J90 Pleural effusion, not elsewhere classified: Secondary | ICD-10-CM | POA: Diagnosis not present

## 2018-10-27 DIAGNOSIS — M47029 Vertebral artery compression syndromes, site unspecified: Secondary | ICD-10-CM | POA: Diagnosis present

## 2018-10-27 DIAGNOSIS — R109 Unspecified abdominal pain: Secondary | ICD-10-CM | POA: Diagnosis not present

## 2018-10-27 DIAGNOSIS — T451X5A Adverse effect of antineoplastic and immunosuppressive drugs, initial encounter: Secondary | ICD-10-CM | POA: Diagnosis present

## 2018-10-27 DIAGNOSIS — I82421 Acute embolism and thrombosis of right iliac vein: Secondary | ICD-10-CM | POA: Diagnosis present

## 2018-10-27 MED ORDER — MORPHINE SULFATE 4 MG/ML IJ SOLN
2.0000 mg | Freq: Once | INTRAMUSCULAR | Status: AC
  Administered 2018-10-27: 2 mg via INTRAVENOUS
  Filled 2018-10-27: qty 1

## 2018-10-27 MED ORDER — ONDANSETRON HCL 4 MG/2ML IJ SOLN
INTRAMUSCULAR | Status: AC
  Filled 2018-10-27: qty 4

## 2018-10-27 MED ORDER — HEPARIN SOD (PORK) LOCK FLUSH 100 UNIT/ML IV SOLN
500.0000 [IU] | Freq: Once | INTRAVENOUS | Status: AC
  Administered 2018-10-27: 500 [IU] via INTRAVENOUS
  Filled 2018-10-27: qty 5

## 2018-10-27 MED ORDER — ONDANSETRON HCL 4 MG/2ML IJ SOLN
8.0000 mg | Freq: Once | INTRAMUSCULAR | Status: AC
  Administered 2018-10-27: 8 mg via INTRAVENOUS

## 2018-10-27 MED ORDER — FENTANYL 25 MCG/HR TD PT72: 25.0000 ug | MEDICATED_PATCH | TRANSDERMAL | 0 refills | Status: DC

## 2018-10-27 MED ORDER — SODIUM CHLORIDE 0.9 % IV SOLN
Freq: Once | INTRAVENOUS | Status: AC
  Administered 2018-10-27: 11:00:00 via INTRAVENOUS
  Filled 2018-10-27: qty 250

## 2018-10-27 MED ORDER — SODIUM CHLORIDE 0.9% FLUSH
10.0000 mL | Freq: Once | INTRAVENOUS | Status: AC
  Administered 2018-10-27: 10 mL via INTRAVENOUS
  Filled 2018-10-27: qty 10

## 2018-10-27 MED ORDER — OXYCODONE-ACETAMINOPHEN 5-325 MG PO TABS: 1.0000 | ORAL_TABLET | Freq: Four times a day (QID) | ORAL | 0 refills | Status: AC | PRN

## 2018-10-27 MED ORDER — SODIUM CHLORIDE 0.9 % IV SOLN: Freq: Once | INTRAVENOUS | Status: DC

## 2018-10-27 MED ORDER — MORPHINE SULFATE (PF) 4 MG/ML IV SOLN
INTRAVENOUS | Status: AC
  Filled 2018-10-27: qty 1

## 2018-10-27 NOTE — Patient Instructions (Signed)
Dehydration, Adult Dehydration is when there is not enough fluid or water in your body. This happens when you lose more fluids than you take in. Dehydration can range from mild to very bad. It should be treated right away to keep it from getting very bad. Symptoms of mild dehydration may include:  Thirst.  Dry lips.  Slightly dry mouth.  Dry, warm skin.  Dizziness. Symptoms of moderate dehydration may include:  Very dry mouth.  Muscle cramps.  Dark pee (urine). Pee may be the color of tea.  Your body making less pee.  Your eyes making fewer tears.  Heartbeat that is uneven or faster than normal (palpitations).  Headache.  Light-headedness, especially when you stand up from sitting.  Fainting (syncope). Symptoms of very bad dehydration may include:  Changes in skin, such as: ? Cold and clammy skin. ? Blotchy (mottled) or pale skin. ? Skin that does not quickly return to normal after being lightly pinched and let go (poor skin turgor).  Changes in body fluids, such as: ? Feeling very thirsty. ? Your eyes making fewer tears. ? Not sweating when body temperature is high, such as in hot weather. ? Your body making very little pee.  Changes in vital signs, such as: ? Weak pulse. ? Pulse that is more than 100 beats a minute when you are sitting still. ? Fast breathing. ? Low blood pressure.  Other changes, such as: ? Sunken eyes. ? Cold hands and feet. ? Confusion. ? Lack of energy (lethargy). ? Trouble waking up from sleep. ? Short-term weight loss. ? Unconsciousness. Follow these instructions at home:  If told by your doctor, drink an ORS: ? Make an ORS by using instructions on the package. ? Start by drinking small amounts, about  cup (120 mL) every 5-10 minutes. ? Slowly drink more until you have had the amount that your doctor said to have.  Drink enough clear fluid to keep your pee clear or pale yellow. If you were told to drink an ORS, finish the ORS  first, then start slowly drinking clear fluids. Drink fluids such as: ? Water. Do not drink only water by itself. Doing that can make the salt (sodium) level in your body get too low (hyponatremia). ? Ice chips. ? Fruit juice that you have added water to (diluted). ? Low-calorie sports drinks.  Avoid: ? Alcohol. ? Drinks that have a lot of sugar. These include high-calorie sports drinks, fruit juice that does not have water added, and soda. ? Caffeine. ? Foods that are greasy or have a lot of fat or sugar.  Take over-the-counter and prescription medicines only as told by your doctor.  Do not take salt tablets. Doing that can make the salt level in your body get too high (hypernatremia).  Eat foods that have minerals (electrolytes). Examples include bananas, oranges, potatoes, tomatoes, and spinach.  Keep all follow-up visits as told by your doctor. This is important. Contact a doctor if:  You have belly (abdominal) pain that: ? Gets worse. ? Stays in one area (localizes).  You have a rash.  You have a stiff neck.  You get angry or annoyed more easily than normal (irritability).  You are more sleepy than normal.  You have a harder time waking up than normal.  You feel: ? Weak. ? Dizzy. ? Very thirsty.  You have peed (urinated) only a small amount of very dark pee during 6-8 hours. Get help right away if:  You have symptoms of   very bad dehydration.  You cannot drink fluids without throwing up (vomiting).  Your symptoms get worse with treatment.  You have a fever.  You have a very bad headache.  You are throwing up or having watery poop (diarrhea) and it: ? Gets worse. ? Does not go away.  You have blood or something green (bile) in your throw-up.  You have blood in your poop (stool). This may cause poop to look black and tarry.  You have not peed in 6-8 hours.  You pass out (faint).  Your heart rate when you are sitting still is more than 100 beats a  minute.  You have trouble breathing. This information is not intended to replace advice given to you by your health care provider. Make sure you discuss any questions you have with your health care provider. Document Released: 10/09/2009 Document Revised: 07/02/2016 Document Reviewed: 02/06/2016 Elsevier Interactive Patient Education  2018 Elsevier Inc.  

## 2018-10-27 NOTE — Progress Notes (Signed)
Faith [name] was seen in the infusion room while she was receiving chemotherapy.  She had been given Zofran 8 mg by Dr. Alen Blew.  She continued to have some nausea.  She was given Ativan 0.5 mg and was given a GI cocktail for GERD.  Her symptoms resolved.  She completed her treatment without any other issues of concern.  Sandi Mealy, MHS, PA-C Physician Assistant

## 2018-10-30 ENCOUNTER — Other Ambulatory Visit: Payer: Self-pay

## 2018-10-30 ENCOUNTER — Emergency Department (HOSPITAL_COMMUNITY): Payer: Medicare Other

## 2018-10-30 ENCOUNTER — Inpatient Hospital Stay (HOSPITAL_COMMUNITY)
Admission: EM | Admit: 2018-10-30 | Discharge: 2018-11-05 | DRG: 686 | Disposition: A | Discharge status: Home Health | Payer: Medicare Other | Attending: Internal Medicine | Admitting: Internal Medicine

## 2018-10-30 ENCOUNTER — Encounter (HOSPITAL_COMMUNITY): Payer: Self-pay

## 2018-10-30 ENCOUNTER — Encounter (HOSPITAL_COMMUNITY): Payer: Medicare Other

## 2018-10-30 DIAGNOSIS — C7951 Secondary malignant neoplasm of bone: Secondary | ICD-10-CM | POA: Diagnosis present

## 2018-10-30 DIAGNOSIS — E876 Hypokalemia: Secondary | ICD-10-CM | POA: Diagnosis present

## 2018-10-30 DIAGNOSIS — M549 Dorsalgia, unspecified: Secondary | ICD-10-CM | POA: Diagnosis present

## 2018-10-30 DIAGNOSIS — I10 Essential (primary) hypertension: Secondary | ICD-10-CM | POA: Diagnosis present

## 2018-10-30 DIAGNOSIS — T451X5A Adverse effect of antineoplastic and immunosuppressive drugs, initial encounter: Secondary | ICD-10-CM | POA: Diagnosis present

## 2018-10-30 DIAGNOSIS — K259 Gastric ulcer, unspecified as acute or chronic, without hemorrhage or perforation: Secondary | ICD-10-CM | POA: Diagnosis present

## 2018-10-30 DIAGNOSIS — Z885 Allergy status to narcotic agent status: Secondary | ICD-10-CM

## 2018-10-30 DIAGNOSIS — K5900 Constipation, unspecified: Secondary | ICD-10-CM | POA: Diagnosis not present

## 2018-10-30 DIAGNOSIS — Z23 Encounter for immunization: Secondary | ICD-10-CM

## 2018-10-30 DIAGNOSIS — D61810 Antineoplastic chemotherapy induced pancytopenia: Secondary | ICD-10-CM | POA: Diagnosis present

## 2018-10-30 DIAGNOSIS — C7800 Secondary malignant neoplasm of unspecified lung: Secondary | ICD-10-CM | POA: Diagnosis not present

## 2018-10-30 DIAGNOSIS — C787 Secondary malignant neoplasm of liver and intrahepatic bile duct: Secondary | ICD-10-CM | POA: Diagnosis present

## 2018-10-30 DIAGNOSIS — E440 Moderate protein-calorie malnutrition: Secondary | ICD-10-CM

## 2018-10-30 DIAGNOSIS — Z66 Do not resuscitate: Secondary | ICD-10-CM | POA: Diagnosis present

## 2018-10-30 DIAGNOSIS — G893 Neoplasm related pain (acute) (chronic): Secondary | ICD-10-CM | POA: Diagnosis present

## 2018-10-30 DIAGNOSIS — I82411 Acute embolism and thrombosis of right femoral vein: Secondary | ICD-10-CM

## 2018-10-30 DIAGNOSIS — J90 Pleural effusion, not elsewhere classified: Secondary | ICD-10-CM | POA: Diagnosis not present

## 2018-10-30 DIAGNOSIS — C679 Malignant neoplasm of bladder, unspecified: Secondary | ICD-10-CM | POA: Diagnosis not present

## 2018-10-30 DIAGNOSIS — M47029 Vertebral artery compression syndromes, site unspecified: Secondary | ICD-10-CM | POA: Diagnosis not present

## 2018-10-30 DIAGNOSIS — I82421 Acute embolism and thrombosis of right iliac vein: Secondary | ICD-10-CM | POA: Diagnosis present

## 2018-10-30 DIAGNOSIS — D649 Anemia, unspecified: Secondary | ICD-10-CM | POA: Insufficient documentation

## 2018-10-30 DIAGNOSIS — K279 Peptic ulcer, site unspecified, unspecified as acute or chronic, without hemorrhage or perforation: Secondary | ICD-10-CM | POA: Diagnosis present

## 2018-10-30 DIAGNOSIS — K219 Gastro-esophageal reflux disease without esophagitis: Secondary | ICD-10-CM | POA: Diagnosis not present

## 2018-10-30 DIAGNOSIS — R112 Nausea with vomiting, unspecified: Secondary | ICD-10-CM | POA: Diagnosis present

## 2018-10-30 DIAGNOSIS — F419 Anxiety disorder, unspecified: Secondary | ICD-10-CM | POA: Diagnosis present

## 2018-10-30 DIAGNOSIS — D61818 Other pancytopenia: Secondary | ICD-10-CM | POA: Diagnosis present

## 2018-10-30 DIAGNOSIS — Z79899 Other long term (current) drug therapy: Secondary | ICD-10-CM | POA: Diagnosis not present

## 2018-10-30 DIAGNOSIS — I82409 Acute embolism and thrombosis of unspecified deep veins of unspecified lower extremity: Secondary | ICD-10-CM | POA: Diagnosis present

## 2018-10-30 DIAGNOSIS — Z86718 Personal history of other venous thrombosis and embolism: Secondary | ICD-10-CM | POA: Diagnosis not present

## 2018-10-30 DIAGNOSIS — C779 Secondary and unspecified malignant neoplasm of lymph node, unspecified: Secondary | ICD-10-CM | POA: Diagnosis present

## 2018-10-30 DIAGNOSIS — E785 Hyperlipidemia, unspecified: Secondary | ICD-10-CM | POA: Diagnosis present

## 2018-10-30 LAB — COMPREHENSIVE METABOLIC PANEL
ALBUMIN: 3.1 g/dL — AB (ref 3.5–5.0)
ALT: 14 U/L (ref 0–44)
AST: 23 U/L (ref 15–41)
Alkaline Phosphatase: 85 U/L (ref 38–126)
Anion gap: 16 — ABNORMAL HIGH (ref 5–15)
BILIRUBIN TOTAL: 1 mg/dL (ref 0.3–1.2)
BUN: 7 mg/dL — AB (ref 8–23)
CO2: 24 mmol/L (ref 22–32)
CREATININE: 0.58 mg/dL (ref 0.44–1.00)
Calcium: 9.3 mg/dL (ref 8.9–10.3)
Chloride: 98 mmol/L (ref 98–111)
Glucose, Bld: 93 mg/dL (ref 70–99)
POTASSIUM: 2.8 mmol/L — AB (ref 3.5–5.1)
SODIUM: 138 mmol/L (ref 135–145)
TOTAL PROTEIN: 7 g/dL (ref 6.5–8.1)

## 2018-10-30 LAB — PROTIME-INR
INR: 1.2
Prothrombin Time: 15.1 seconds (ref 11.4–15.2)

## 2018-10-30 LAB — CBC
HCT: 28.8 % — ABNORMAL LOW (ref 36.0–46.0)
Hemoglobin: 9.2 g/dL — ABNORMAL LOW (ref 12.0–15.0)
MCH: 25.8 pg — ABNORMAL LOW (ref 26.0–34.0)
MCHC: 31.9 g/dL (ref 30.0–36.0)
MCV: 80.7 fL (ref 80.0–100.0)
Platelets: 121 10*3/uL — ABNORMAL LOW (ref 150–400)
RBC: 3.57 MIL/uL — ABNORMAL LOW (ref 3.87–5.11)
RDW: 14.5 % (ref 11.5–15.5)
WBC: 2.8 10*3/uL — ABNORMAL LOW (ref 4.0–10.5)
nRBC: 0 % (ref 0.0–0.2)

## 2018-10-30 LAB — LIPASE, BLOOD: LIPASE: 12 U/L (ref 11–51)

## 2018-10-30 LAB — URINALYSIS, ROUTINE W REFLEX MICROSCOPIC
Bilirubin Urine: NEGATIVE
GLUCOSE, UA: NEGATIVE mg/dL
Ketones, ur: 80 mg/dL — AB
Leukocytes, UA: NEGATIVE
NITRITE: NEGATIVE
PH: 6 (ref 5.0–8.0)
Protein, ur: NEGATIVE mg/dL
SPECIFIC GRAVITY, URINE: 1.011 (ref 1.005–1.030)

## 2018-10-30 LAB — TYPE AND SCREEN
ABO/RH(D): O POS
Antibody Screen: NEGATIVE

## 2018-10-30 LAB — APTT: aPTT: 28 seconds (ref 24–36)

## 2018-10-30 LAB — I-STAT CG4 LACTIC ACID, ED: Lactic Acid, Venous: 1.2 mmol/L (ref 0.5–1.9)

## 2018-10-30 LAB — ABO/RH: ABO/RH(D): O POS

## 2018-10-30 LAB — MAGNESIUM: Magnesium: 1.7 mg/dL (ref 1.7–2.4)

## 2018-10-30 MED ORDER — PROMETHAZINE HCL 25 MG/ML IJ SOLN: 12.5000 mg | Freq: Four times a day (QID) | INTRAMUSCULAR | Status: DC | PRN

## 2018-10-30 MED ORDER — IOPAMIDOL (ISOVUE-300) INJECTION 61%
INTRAVENOUS | Status: AC
  Filled 2018-10-30: qty 100

## 2018-10-30 MED ORDER — METOPROLOL TARTRATE 5 MG/5ML IV SOLN
5.0000 mg | Freq: Four times a day (QID) | INTRAVENOUS | Status: DC
  Administered 2018-10-30 – 2018-11-05 (×22): 5 mg via INTRAVENOUS
  Filled 2018-10-30 (×22): qty 5

## 2018-10-30 MED ORDER — BISACODYL 10 MG RE SUPP
10.0000 mg | Freq: Once | RECTAL | Status: AC
  Administered 2018-10-30: 10 mg via RECTAL
  Filled 2018-10-30: qty 1

## 2018-10-30 MED ORDER — ZOLPIDEM TARTRATE 5 MG PO TABS: 5.0000 mg | ORAL_TABLET | Freq: Every evening | ORAL | Status: DC | PRN

## 2018-10-30 MED ORDER — ACETAMINOPHEN 325 MG PO TABS: 650.0000 mg | ORAL_TABLET | Freq: Four times a day (QID) | ORAL | Status: DC | PRN

## 2018-10-30 MED ORDER — ENOXAPARIN SODIUM 80 MG/0.8ML ~~LOC~~ SOLN
1.0000 mg/kg | Freq: Once | SUBCUTANEOUS | Status: AC
  Administered 2018-10-30: 70 mg via SUBCUTANEOUS
  Filled 2018-10-30: qty 0.7

## 2018-10-30 MED ORDER — POTASSIUM CHLORIDE CRYS ER 20 MEQ PO TBCR
40.0000 meq | EXTENDED_RELEASE_TABLET | Freq: Once | ORAL | Status: DC
  Filled 2018-10-30 (×4): qty 2

## 2018-10-30 MED ORDER — POLYETHYLENE GLYCOL 3350 17 G PO PACK
17.0000 g | PACK | Freq: Every day | ORAL | Status: DC | PRN
  Filled 2018-10-30: qty 1

## 2018-10-30 MED ORDER — SODIUM CHLORIDE 0.9 % IV BOLUS
1000.0000 mL | Freq: Once | INTRAVENOUS | Status: AC
  Administered 2018-10-30: 1000 mL via INTRAVENOUS

## 2018-10-30 MED ORDER — INFLUENZA VAC SPLIT HIGH-DOSE 0.5 ML IM SUSY
0.5000 mL | PREFILLED_SYRINGE | INTRAMUSCULAR | Status: AC
  Administered 2018-10-31: 0.5 mL via INTRAMUSCULAR
  Filled 2018-10-30: qty 0.5

## 2018-10-30 MED ORDER — OXYCODONE-ACETAMINOPHEN 5-325 MG PO TABS
1.0000 | ORAL_TABLET | ORAL | Status: DC | PRN
  Administered 2018-10-31: 1 via ORAL
  Filled 2018-10-30: qty 1

## 2018-10-30 MED ORDER — FERROUS SULFATE 325 (65 FE) MG PO TABS: 325.0000 mg | ORAL_TABLET | Freq: Every day | ORAL | Status: DC

## 2018-10-30 MED ORDER — POTASSIUM CHLORIDE 10 MEQ/100ML IV SOLN
10.0000 meq | INTRAVENOUS | Status: AC
  Administered 2018-10-30 (×4): 10 meq via INTRAVENOUS
  Filled 2018-10-30 (×3): qty 100

## 2018-10-30 MED ORDER — POTASSIUM CHLORIDE 10 MEQ/100ML IV SOLN
10.0000 meq | INTRAVENOUS | Status: AC
  Administered 2018-10-30 (×3): 10 meq via INTRAVENOUS
  Filled 2018-10-30 (×3): qty 100

## 2018-10-30 MED ORDER — MAGNESIUM SULFATE IN D5W 1-5 GM/100ML-% IV SOLN
1.0000 g | Freq: Once | INTRAVENOUS | Status: AC
  Administered 2018-10-30: 1 g via INTRAVENOUS
  Filled 2018-10-30: qty 100

## 2018-10-30 MED ORDER — POTASSIUM CHLORIDE 20 MEQ/15ML (10%) PO SOLN: 40.0000 meq | Freq: Once | ORAL | Status: DC

## 2018-10-30 MED ORDER — SODIUM CHLORIDE 0.9 % IV SOLN
INTRAVENOUS | Status: DC
  Administered 2018-10-30 (×2): via INTRAVENOUS

## 2018-10-30 MED ORDER — SENNOSIDES-DOCUSATE SODIUM 8.6-50 MG PO TABS: 1.0000 | ORAL_TABLET | Freq: Every day | ORAL | Status: DC | PRN

## 2018-10-30 MED ORDER — ONDANSETRON HCL 4 MG/2ML IJ SOLN
4.0000 mg | Freq: Four times a day (QID) | INTRAMUSCULAR | Status: DC | PRN
  Administered 2018-10-30: 4 mg via INTRAVENOUS
  Filled 2018-10-30: qty 2

## 2018-10-30 MED ORDER — FAMOTIDINE IN NACL 20-0.9 MG/50ML-% IV SOLN
20.0000 mg | Freq: Two times a day (BID) | INTRAVENOUS | Status: DC
  Administered 2018-10-30 – 2018-11-03 (×10): 20 mg via INTRAVENOUS
  Filled 2018-10-30 (×11): qty 50

## 2018-10-30 MED ORDER — SODIUM CHLORIDE 0.9 % IJ SOLN
INTRAMUSCULAR | Status: AC
  Filled 2018-10-30: qty 50

## 2018-10-30 MED ORDER — FENTANYL CITRATE (PF) 100 MCG/2ML IJ SOLN
50.0000 ug | Freq: Once | INTRAMUSCULAR | Status: AC
  Administered 2018-10-30: 50 ug via INTRAVENOUS
  Filled 2018-10-30: qty 2

## 2018-10-30 MED ORDER — ONDANSETRON 4 MG PO TBDP
4.0000 mg | ORAL_TABLET | Freq: Once | ORAL | Status: AC | PRN
  Administered 2018-10-30: 4 mg via ORAL
  Filled 2018-10-30: qty 1

## 2018-10-30 MED ORDER — LORAZEPAM 2 MG/ML IJ SOLN: 0.5000 mg | INTRAMUSCULAR | Status: DC | PRN

## 2018-10-30 MED ORDER — ROSUVASTATIN CALCIUM 5 MG PO TABS: 5.0000 mg | ORAL_TABLET | Freq: Every day | ORAL | Status: DC

## 2018-10-30 MED ORDER — METOPROLOL SUCCINATE ER 50 MG PO TB24: 100.0000 mg | ORAL_TABLET | Freq: Every day | ORAL | Status: DC

## 2018-10-30 MED ORDER — HYDRALAZINE HCL 20 MG/ML IJ SOLN: 5.0000 mg | INTRAMUSCULAR | Status: DC | PRN

## 2018-10-30 MED ORDER — SUCRALFATE 1 G PO TABS
1.0000 g | ORAL_TABLET | Freq: Three times a day (TID) | ORAL | Status: DC
  Filled 2018-10-30: qty 1

## 2018-10-30 MED ORDER — LORAZEPAM 0.5 MG PO TABS: 0.5000 mg | ORAL_TABLET | ORAL | Status: DC | PRN

## 2018-10-30 MED ORDER — IRBESARTAN 150 MG PO TABS: 150.0000 mg | ORAL_TABLET | Freq: Every day | ORAL | Status: DC

## 2018-10-30 MED ORDER — ONDANSETRON HCL 4 MG PO TABS: 4.0000 mg | ORAL_TABLET | Freq: Four times a day (QID) | ORAL | Status: DC | PRN

## 2018-10-30 MED ORDER — ONDANSETRON HCL 4 MG PO TABS
4.0000 mg | ORAL_TABLET | Freq: Four times a day (QID) | ORAL | Status: DC
  Administered 2018-10-31 – 2018-11-05 (×3): 4 mg via ORAL
  Filled 2018-10-30 (×3): qty 1

## 2018-10-30 MED ORDER — ENOXAPARIN SODIUM 80 MG/0.8ML ~~LOC~~ SOLN
70.0000 mg | Freq: Two times a day (BID) | SUBCUTANEOUS | Status: DC
  Administered 2018-10-30 – 2018-10-31 (×2): 70 mg via SUBCUTANEOUS
  Filled 2018-10-30 (×2): qty 0.8

## 2018-10-30 MED ORDER — MORPHINE SULFATE (PF) 2 MG/ML IV SOLN
2.0000 mg | INTRAVENOUS | Status: DC | PRN
  Administered 2018-10-30: 2 mg via INTRAVENOUS
  Filled 2018-10-30: qty 1

## 2018-10-30 MED ORDER — IOPAMIDOL (ISOVUE-300) INJECTION 61%
100.0000 mL | Freq: Once | INTRAVENOUS | Status: AC | PRN
  Administered 2018-10-30: 100 mL via INTRAVENOUS

## 2018-10-30 MED ORDER — PANTOPRAZOLE SODIUM 40 MG PO TBEC: 40.0000 mg | DELAYED_RELEASE_TABLET | Freq: Two times a day (BID) | ORAL | Status: DC

## 2018-10-30 MED ORDER — OXYCODONE-ACETAMINOPHEN 5-325 MG PO TABS: 1.0000 | ORAL_TABLET | Freq: Four times a day (QID) | ORAL | Status: DC | PRN

## 2018-10-30 MED ORDER — PANTOPRAZOLE SODIUM 40 MG PO TBEC: 40.0000 mg | DELAYED_RELEASE_TABLET | Freq: Every day | ORAL | Status: DC

## 2018-10-30 MED ORDER — ONDANSETRON HCL 4 MG/2ML IJ SOLN
4.0000 mg | Freq: Four times a day (QID) | INTRAMUSCULAR | Status: DC
  Administered 2018-10-30 – 2018-11-05 (×19): 4 mg via INTRAVENOUS
  Filled 2018-10-30 (×20): qty 2

## 2018-10-30 MED ORDER — KCL IN DEXTROSE-NACL 40-5-0.9 MEQ/L-%-% IV SOLN
INTRAVENOUS | Status: DC
  Administered 2018-10-30 – 2018-11-01 (×4): via INTRAVENOUS
  Filled 2018-10-30 (×6): qty 1000

## 2018-10-30 MED ORDER — MORPHINE SULFATE (PF) 2 MG/ML IV SOLN
2.0000 mg | INTRAVENOUS | Status: DC | PRN
  Administered 2018-10-30 – 2018-11-01 (×2): 2 mg via INTRAVENOUS
  Filled 2018-10-30 (×2): qty 1

## 2018-10-30 MED ORDER — LIDOCAINE-PRILOCAINE 2.5-2.5 % EX CREA: 1.0000 "application " | TOPICAL_CREAM | CUTANEOUS | Status: DC | PRN

## 2018-10-30 MED ORDER — HEPARIN SODIUM (PORCINE) 5000 UNIT/ML IJ SOLN: 5000.0000 [IU] | Freq: Three times a day (TID) | INTRAMUSCULAR | Status: DC

## 2018-10-30 MED ORDER — PANTOPRAZOLE SODIUM 40 MG IV SOLR
40.0000 mg | Freq: Two times a day (BID) | INTRAVENOUS | Status: DC
  Administered 2018-10-30 – 2018-11-03 (×10): 40 mg via INTRAVENOUS
  Filled 2018-10-30 (×10): qty 40

## 2018-10-30 NOTE — H&P (Signed)
History and Physical    Faith [name]:[id number] DOB: 05-21-1951 DOA: 10/30/2018  Referring MD/NP/PA:   PCP: Fanny Bien, MD   Patient coming from:  The patient is coming from home.  At baseline, pt is independent for most of ADL.        Chief Complaint: Intractable nausea vomiting  HPI: Faith [name] is a 67 y.o. female with medical history significant of metastasized bladder cancer (s/p of surgery, radiation, last chemo on 10/25), hypertension, hyperlipidemia, GERD, anxiety, anemia, who presents with intractable nausea and vomiting.  Patient was recently hospitalized from 10/2-10/7 because of intractable nausea and vomiting for almost 2 months.  Patient had EGD, which showed nonbleeding gastric ulcer with no stigmata of bleeding.  Abdominal ultrasound showed gallbladder sludge without cholelithiasis or acute cholecystitis. Per discharge summery, PPI was increased to BID, and may need to add Carafate if she is still symptomatic.   Patient states that in the past several days, she has been having worsening nausea and vomiting.  She has vomited many times each day, with non-biliary and nonbloody vomitus.  She has diffused abdominal discomfort, but no diarrhea.  Patient denies chest pain, shortness breath, cough, fever or chills.  No symptoms of UTI or unilateral weakness.  Patient complains of chronic back pain, no leg numbness or unilateral weakness. Pt states that she has mild pain in right groin area.  ED Course: pt was found to have pancytopenia with WBC 2.8, platelet 121, hemoglobin 9.2 which was 10.9 on 10/16/2018, lipase 12, lactic acid 1.20, pending urinalysis, potassium 2.8, creatinine normal.  Initially tachycardia, currently heart rate 80s, no tachypnea, oxygen saturation 97% on room air, temperature normal. CT abdomen/pelvis showed metastasized liver disease, and possible right external iliac and femoral vein DVT. Patient is placed on MedSurg [place] for observation  Review  of Systems:   General: no fevers, chills, no body weight gain, has poor appetite, has fatigue HEENT: no blurry vision, hearing changes or sore throat Respiratory: no dyspnea, coughing, wheezing CV: no chest pain, no palpitations GI: has nausea, vomiting, abdominal pain, no diarrhea, constipation GU: no dysuria, burning on urination, increased urinary frequency, hematuria  Ext: no leg edema Neuro: no unilateral weakness, numbness, or tingling, no vision change or hearing loss Skin: no rash, no skin tear. MSK: No muscle spasm, no deformity, no limitation of range of movement in spin Heme: No easy bruising.  Travel history: No recent long distant travel.  Allergy:  Allergies  Allergen Reactions  . Tramadol Nausea And Vomiting    Past Medical History:  Diagnosis Date  . Anemia due to acute blood loss 11/24/2016   TRANSFUSED PRBC X2 UNITS 11-25-2016  . Bladder neoplasm   . Cancer ([employer])   . Constipation   . Gross hematuria   . Hypertension   . Urgency of urination     Past Surgical History:  Procedure Laterality Date  . BIOPSY  09/29/2018   Procedure: BIOPSY;  Surgeon: Laurence Spates, MD;  Location: [facility];  Service: Endoscopy;;  . CYSTOSCOPY W/ URETERAL STENT PLACEMENT Right 12/28/2016   Procedure: CYSTOSCOPY WITH RIGHT RETROGRADE URETERAL STENT PLACEMENT;  Surgeon: Festus Aloe, MD;  Location: [facility];  Service: Urology;  Laterality: Right;  . ESOPHAGOGASTRODUODENOSCOPY (EGD) WITH PROPOFOL N/A 09/29/2018   Procedure: ESOPHAGOGASTRODUODENOSCOPY (EGD) WITH PROPOFOL;  Surgeon: Laurence Spates, MD;  Location: [facility];  Service: Endoscopy;  Laterality: N/A;  . IR IMAGING GUIDED PORT INSERTION  08/30/2018  . TRANSURETHRAL RESECTION OF BLADDER  TUMOR WITH MITOMYCIN-C N/A 12/28/2016   Procedure: TRANSURETHRAL RESECTION OF BLADDER TUMOR GREATER THAN 5 CM;  Surgeon: Festus Aloe, MD;  Location: [facility];  Service: Urology;  Laterality: N/A;    . TUBAL LIGATION Bilateral 1990's    Social History:  reports that she has never smoked. She has never used smokeless tobacco. She reports that she does not drink alcohol or use drugs.  Family History:  Family History  Problem Relation Age of Onset  . Leukemia Father      Prior to Admission medications   Medication Sig Start Date End Date Taking? Authorizing Provider  acetaminophen (TYLENOL) 325 MG tablet Take 650 mg by mouth every 6 (six) hours as needed for mild pain, moderate pain or headache.   Yes [provider]  amLODipine (NORVASC) 10 MG tablet Take 1 tablet (10 mg total) by mouth every morning. 03/02/17  Yes Robyn Haber, MD  fentaNYL (DURAGESIC - DOSED MCG/HR) 25 MCG/HR patch Place 1 patch (25 mcg total) onto the skin every 3 (three) days. 10/27/18  Yes Wyatt Portela, MD  ferrous sulfate 325 (65 FE) MG tablet Start taking only after constipation is relieved. Take 1 tablet once daily for 1 week, then 1 tablet twice daily for 1 week and then 1 tablet three times daily. Patient taking differently: Take 325 mg by mouth daily.  11/26/16  Yes Bonnielee Haff, MD  lidocaine-prilocaine (EMLA) cream Apply 1 application topically as needed. Patient taking differently: Apply 1 application topically as needed (port).  08/18/18  Yes Wyatt Portela, MD  LORazepam (ATIVAN) 0.5 MG tablet Take 1 tablet (0.5 mg total) by mouth every 4 (four) hours as needed for anxiety. 09/20/18  Yes Hayden Pedro, PA-C  metoprolol succinate (TOPROL-XL) 100 MG 24 hr tablet Take 100 mg by mouth daily. 07/04/18  Yes [provider]  olmesartan (BENICAR) 40 MG tablet Take 40 mg by mouth daily. 05/19/18  Yes [provider]  omeprazole (PRILOSEC) 40 MG capsule Take 1 capsule (40 mg total) by mouth daily. 10/20/18  Yes Tanner, Lyndon Code., PA-C  ondansetron (ZOFRAN-ODT) 4 MG disintegrating tablet Take 1-2 tablets (4-8 mg total) by mouth every 8 (eight) hours as needed for nausea. 10/02/18   Yes Debbe Odea, MD  oxyCODONE-acetaminophen (PERCOCET/ROXICET) 5-325 MG tablet Take 1 tablet by mouth every 6 (six) hours as needed (for breakthrough pain). 10/27/18  Yes Wyatt Portela, MD  pantoprazole (PROTONIX) 40 MG tablet Take 1 tablet (40 mg total) by mouth 2 (two) times daily before a meal. Patient taking differently: Take 40 mg by mouth 2 (two) times daily as needed (Heart burn).  10/02/18  Yes Debbe Odea, MD  polyethylene glycol (MIRALAX / GLYCOLAX) packet Take 17 g by mouth daily. Patient taking differently: Take 17 g by mouth daily as needed for moderate constipation.  10/02/18  Yes Debbe Odea, MD  prochlorperazine (COMPAZINE) 10 MG tablet Take 1 tablet (10 mg total) by mouth every 6 (six) hours as needed for nausea or vomiting. 08/18/18  Yes Wyatt Portela, MD  rosuvastatin (CRESTOR) 5 MG tablet Take 5 mg by mouth daily. 07/05/18  Yes [provider]  senna-docusate (SENOKOT-S) 8.6-50 MG tablet Take 1 tablet by mouth daily as needed. Patient taking differently: Take 1 tablet by mouth daily as needed for mild constipation or moderate constipation.  03/02/17  Yes Robyn Haber, MD  dexamethasone (DECADRON) 4 MG tablet Take 1 tablet (4 mg total) by mouth 2 (two) times daily  with a meal. Take it for 3 days after chemotherapy. Patient not taking: Reported on 10/30/2018 10/16/18   Wyatt Portela, MD    Physical Exam: Vitals:   10/30/18 0200 10/30/18 0300 10/30/18 0523 10/30/18 0600  BP: (!) 162/83 (!) 162/76 (!) 168/97 (!) 158/85  Pulse: 87 80 85 86  Resp: 17 16 16 15   Temp:      TempSrc:      SpO2: 98% 97% 96% 100%   General: Not in acute distress. Dry mucous membrane HEENT:       Eyes: PERRL, EOMI, no scleral icterus.       ENT: No discharge from the ears and nose, no pharynx injection, no tonsillar enlargement.        Neck: No JVD, no bruit, no mass felt. Heme: No neck lymph node enlargement. Cardiac: S1/S2, RRR, No murmurs, No gallops or rubs. Respiratory:   No rales, wheezing, rhonchi or rubs. GI: Soft, nondistended, mild tenderness in central abdomen, no rebound pain, no organomegaly, BS present. GU: No hematuria Ext: No pitting leg edema bilaterally. 2+DP/PT pulse bilaterally. Musculoskeletal: No joint deformities, No joint redness or warmth, no limitation of ROM in spin. Skin: No rashes.  Neuro: Alert, oriented X3, cranial nerves II-XII grossly intact, moves all extremities normally.  Psych: Patient is not psychotic, no suicidal or hemocidal ideation.  Labs on Admission: I have personally reviewed following labs and imaging studies  CBC: Recent Labs  Lab 10/30/18 0201  WBC 2.8*  HGB 9.2*  HCT 28.8*  MCV 80.7  PLT 326*   Basic Metabolic Panel: Recent Labs  Lab 10/30/18 0201  NA 138  K 2.8*  CL 98  CO2 24  GLUCOSE 93  BUN 7*  CREATININE 0.58  CALCIUM 9.3   GFR: CrCl cannot be calculated (Unknown ideal weight.). Liver Function Tests: Recent Labs  Lab 10/30/18 0201  AST 23  ALT 14  ALKPHOS 85  BILITOT 1.0  PROT 7.0  ALBUMIN 3.1*   Recent Labs  Lab 10/30/18 0201  LIPASE 12   No results for input(s): AMMONIA in the last 168 hours. Coagulation Profile: Recent Labs  Lab 10/30/18 0538  INR 1.20   Cardiac Enzymes: No results for input(s): CKTOTAL, CKMB, CKMBINDEX, TROPONINI in the last 168 hours. BNP (last 3 results) No results for input(s): PROBNP in the last 8760 hours. HbA1C: No results for input(s): HGBA1C in the last 72 hours. CBG: No results for input(s): GLUCAP in the last 168 hours. Lipid Profile: No results for input(s): CHOL, HDL, LDLCALC, TRIG, CHOLHDL, LDLDIRECT in the last 72 hours. Thyroid Function Tests: No results for input(s): TSH, T4TOTAL, FREET4, T3FREE, THYROIDAB in the last 72 hours. Anemia Panel: No results for input(s): VITAMINB12, FOLATE, FERRITIN, TIBC, IRON, RETICCTPCT in the last 72 hours. Urine analysis:    Component Value Date/Time   COLORURINE YELLOW 09/28/2018 0337    APPEARANCEUR CLOUDY (A) 09/28/2018 0337   LABSPEC 1.023 09/28/2018 0337   PHURINE 5.0 09/28/2018 0337   GLUCOSEU NEGATIVE 09/28/2018 0337   HGBUR MODERATE (A) 09/28/2018 0337   BILIRUBINUR NEGATIVE 09/28/2018 0337   KETONESUR 80 (A) 09/28/2018 0337   PROTEINUR 100 (A) 09/28/2018 0337   NITRITE NEGATIVE 09/28/2018 0337   LEUKOCYTESUR NEGATIVE 09/28/2018 0337   Sepsis Labs: @LABRCNTIP (procalcitonin:4,lacticidven:4) )No results found for this or any previous visit (from the past 240 hour(s)).   Radiological Exams on Admission: Ct Abdomen Pelvis W Contrast  Result Date: 10/30/2018 CLINICAL DATA:  Acute abdominal pain. History of bladder cancer.  EXAM: CT ABDOMEN AND PELVIS WITH CONTRAST TECHNIQUE: Multidetector CT imaging of the abdomen and pelvis was performed using the standard protocol following bolus administration of intravenous contrast. CONTRAST:  133mL ISOVUE-300 IOPAMIDOL (ISOVUE-300) INJECTION 61% COMPARISON:  09/03/2018 FINDINGS: Lower chest: Small but increasing bilateral pleural effusions with basilar atelectasis. Greater on the left. Lung base nodules again demonstrated. Hepatobiliary: Multiple hypoenhancing liver lesions, likely metastatic. These are increased in conspicuity , size and number since previous study. Largest is in segment 6 and measures about 2.1 cm diameter. Layering density in the gallbladder likely sludge. No bile duct dilatation. Pancreas: Unremarkable. No pancreatic ductal dilatation or surrounding inflammatory changes. Spleen: Normal in size without focal abnormality. Adrenals/Urinary Tract: No adrenal gland nodules. Cyst on the left kidney. No hydronephrosis or hydroureter. Bladder is unremarkable. Stomach/Bowel: Stomach is within normal limits. Appendix is not identified. No evidence of bowel wall thickening, distention, or inflammatory changes. Vascular/Lymphatic: Aortic atherosclerosis. No enlarged abdominal or pelvic lymph nodes. Probable filling defect in the  right external iliac and femoral veins suggesting DVT. Ultrasound correlation suggested. Reproductive: Uterus and bilateral adnexa are unremarkable. Other: Small umbilical hernia containing fat. No free air or free fluid in the abdomen. Musculoskeletal: Multiple sclerotic bone lesions consistent with metastases. Superior endplate compression at L1. IMPRESSION: 1. Small but increasing bilateral pleural effusions with basilar atelectasis, greater on the left. Lung base nodules again demonstrated. Likely metastatic. 2. Multiple hypoenhancing liver lesions, increased in conspicuity since previous study, likely representing metastatic disease. 3. Multiple sclerotic bone metastases. 4. Filling defect in the right external iliac and femoral veins suggesting DVT. Aortic Atherosclerosis (ICD10-I70.0). These results were called by telephone at the time of interpretation on 10/30/2018 at 4:08 am to Dr. Ezequiel Essex , who verbally acknowledged these results. Electronically Signed   By: Lucienne Capers M.D.   On: 10/30/2018 04:10   Dg Abdomen Acute W/chest  Result Date: 10/30/2018 CLINICAL DATA:  Abdominal pain, nausea, and vomiting. History of bladder cancer. EXAM: DG ABDOMEN ACUTE W/ 1V CHEST COMPARISON:  09/28/2018 FINDINGS: A right jugular Port-A-Cath remains in place with tip over the mid SVC. The cardiomediastinal silhouette is unchanged with cardiac size upper limits of normal. Scarring or atelectasis is again noted in the left greater than right lower lungs. No sizable pleural effusion or pneumothorax is identified. No intraperitoneal free air is identified on the lateral decubitus radiograph. Scattered bowel gas is present throughout the abdomen. No dilated loops of bowel are seen to suggest obstruction. No acute osseous abnormality is identified. IMPRESSION: 1. Bibasilar scarring/atelectasis without evidence of acute cardiopulmonary disease. 2. Nonobstructed bowel gas pattern. Electronically Signed   By: Logan Bores M.D.   On: 10/30/2018 02:17     EKG: Not done in ED, will get one.   Assessment/Plan Principal Problem:   Intractable nausea and vomiting Active Problems:   HTN (hypertension)   Urothelial carcinoma of bladder (HCC)   Hyperlipidemia   PUD (peptic ulcer disease)   Pancytopenia (HCC)   Anxiety   DVT (deep venous thrombosis) (HCC)   Intractable nausea and vomiting: Likely due to multifactorial etiology, including metastasized to liver disease and history of peptic ulcer disease.  Lipase normal.  CT showed multiple hypoenhancing liver lesions, increased in conspicuity since previous study.  - will placed on MedSurg bed for observation - IV fluid: 1 L normal saline, followed by 25 cc/h - PRN Zofran for nausea - Continue Protonix, - pepcid by IV - Add Carafate  HTN:  -Continue home medications: Amlodipine,  metoprolol, irbesartan -IV hydralazine prn  PUD (peptic ulcer disease) -Protonix bid, Carafate -IV Pepcid as above  Urothelial carcinoma of bladder ([place]): high grade with metastatic disease to lymph nodes, bones, lungs, liver. S/p surgery, and radiation to T and L spine 9/19. Last chemo was 10/25. -f/u with oncologsit  Hyperlipidemia: -crestor  Pancytopenia ([place]): Likely due to chemotherapy, iron deficiency and malignancy.  Hemoglobin 10.7--> 9.2.  No signs of GI bleeding -Continue iron supplement  Anxiety: -prn ativan  DVT: CT showed possible acute deep vein thrombosis in right external iliac and femoral vein.  -will give one dose of Lovenox now -f/u LE doppler     DVT ppx: will give one full dose of Lovenox now, and then Sq Heparin tomorrow. If confirmed for DVT-->will be on full dose of blood thinner Code Status: Full code Family Communication: None at bed side.   Disposition Plan:  Anticipate discharge back to previous home environment Consults called:  none Admission status: medical floor/obs     Date of Service 10/30/2018    Ivor Costa [facility] Pager [phone]  If 7PM-7AM, please contact night-coverage [email or web address] Password [email or web address] 10/30/2018, 6:40 AM

## 2018-10-30 NOTE — ED Triage Notes (Signed)
Pt reports constant nausea and emesis starting yesterday. States she has been unable to keep down any medications. Pt reports cancer in her spine and burning pain in her back and round her waist. A&Ox4.

## 2018-10-30 NOTE — ED Provider Notes (Signed)
[facility] DEPT Provider Note   CSN: [id number] Arrival date & time: 10/30/18  0027     History   Chief Complaint Chief Complaint  Patient presents with  . Emesis    HPI Faith [name] is a 67 y.o. female.  Patient with history of metastatic bladder cancer with bony metastases presenting with intractable nausea and vomiting since yesterday.  States not been able keep any of her medications down.  Her last chemotherapy was in October 25.  She denies any diarrhea, chills, fever.  Complains of diffuse back pain and abdominal pain along her waist which is similar to her areas of previous pain.  Patient was admitted to the hospital last month with similar presentation and underwent an EGD which showed ulcers.  She denies any blood in her emesis or blood in her stool.  She has not had any sick contacts at home.  The history is provided by the patient.  Emesis   Associated symptoms include abdominal pain. Pertinent negatives include no arthralgias, no cough, no fever, no headaches and no myalgias.    Past Medical History:  Diagnosis Date  . Anemia due to acute blood loss 11/24/2016   TRANSFUSED PRBC X2 UNITS 11-25-2016  . Bladder neoplasm   . Cancer ([place])   . Constipation   . Gross hematuria   . Hypertension   . Urgency of urination     Patient Active Problem List   Diagnosis Date Noted  . PUD (peptic ulcer disease) 10/02/2018  . Intractable vomiting with nausea 09/29/2018  . Hypokalemia 09/28/2018  . Port-A-Cath in place 09/21/2018  . Bone metastasis ([place]) 09/04/2018  . Hyponatremia 09/04/2018  . Hyperlipidemia 09/04/2018  . SIRS (systemic inflammatory response syndrome) ([place]) 09/03/2018  . Goals of care, counseling/discussion 08/18/2018  . Urothelial carcinoma of bladder ([place]) 07/28/2018  . Acute blood loss anemia 11/25/2016  . HTN (hypertension) 11/25/2016  . Gross hematuria 11/25/2016    Past Surgical History:  Procedure Laterality  Date  . BIOPSY  09/29/2018   Procedure: BIOPSY;  Surgeon: Laurence Spates, MD;  Location: [facility];  Service: Endoscopy;;  . CYSTOSCOPY W/ URETERAL STENT PLACEMENT Right 12/28/2016   Procedure: CYSTOSCOPY WITH RIGHT RETROGRADE URETERAL STENT PLACEMENT;  Surgeon: Festus Aloe, MD;  Location: [facility];  Service: Urology;  Laterality: Right;  . ESOPHAGOGASTRODUODENOSCOPY (EGD) WITH PROPOFOL N/A 09/29/2018   Procedure: ESOPHAGOGASTRODUODENOSCOPY (EGD) WITH PROPOFOL;  Surgeon: Laurence Spates, MD;  Location: [facility];  Service: Endoscopy;  Laterality: N/A;  . IR IMAGING GUIDED PORT INSERTION  08/30/2018  . TRANSURETHRAL RESECTION OF BLADDER TUMOR WITH MITOMYCIN-C N/A 12/28/2016   Procedure: TRANSURETHRAL RESECTION OF BLADDER TUMOR GREATER THAN 5 CM;  Surgeon: Festus Aloe, MD;  Location: [facility];  Service: Urology;  Laterality: N/A;  . TUBAL LIGATION Bilateral 1990's     OB History   None      Home Medications    Prior to Admission medications   Medication Sig Start Date End Date Taking? Authorizing Provider  acetaminophen (TYLENOL) 325 MG tablet Take 650 mg by mouth every 6 (six) hours as needed for mild pain, moderate pain or headache.    [provider]  amLODipine (NORVASC) 10 MG tablet Take 1 tablet (10 mg total) by mouth every morning. 03/02/17   Robyn Haber, MD  dexamethasone (DECADRON) 4 MG tablet Take 1 tablet (4 mg total) by mouth 2 (two) times daily with a meal. Take it for 3 days after chemotherapy. 10/16/18  Wyatt Portela, MD  Docusate Calcium (STOOL SOFTENER PO) Take 2 tablets by mouth daily as needed (constipation).    [provider]  fentaNYL (DURAGESIC - DOSED MCG/HR) 25 MCG/HR patch Place 1 patch (25 mcg total) onto the skin every 3 (three) days. 10/27/18   Wyatt Portela, MD  ferrous sulfate 325 (65 FE) MG tablet Start taking only after constipation is relieved. Take 1 tablet once daily for 1 week, then 1  tablet twice daily for 1 week and then 1 tablet three times daily. Patient taking differently: Take 325 mg by mouth daily.  11/26/16   Bonnielee Haff, MD  lidocaine-prilocaine (EMLA) cream Apply 1 application topically as needed. Patient taking differently: Apply 1 application topically as needed (port).  08/18/18   Wyatt Portela, MD  LORazepam (ATIVAN) 0.5 MG tablet Take 1 tablet (0.5 mg total) by mouth every 4 (four) hours as needed for anxiety. 09/20/18   Hayden Pedro, PA-C  metoprolol succinate (TOPROL-XL) 100 MG 24 hr tablet Take 100 mg by mouth daily. 07/04/18   [provider]  montelukast (SINGULAIR) 10 MG tablet Take 10 mg by mouth daily. 07/14/18   [provider]  olmesartan (BENICAR) 40 MG tablet Take 40 mg by mouth daily. 05/19/18   [provider]  omeprazole (PRILOSEC) 40 MG capsule Take 1 capsule (40 mg total) by mouth daily. 10/20/18   Tanner, Lyndon Code., PA-C  ondansetron (ZOFRAN-ODT) 4 MG disintegrating tablet Take 1-2 tablets (4-8 mg total) by mouth every 8 (eight) hours as needed for nausea. 10/02/18   Debbe Odea, MD  oxyCODONE-acetaminophen (PERCOCET/ROXICET) 5-325 MG tablet Take 1 tablet by mouth every 6 (six) hours as needed (for breakthrough pain). 10/27/18   Wyatt Portela, MD  pantoprazole (PROTONIX) 40 MG tablet Take 1 tablet (40 mg total) by mouth 2 (two) times daily before a meal. 10/02/18   Debbe Odea, MD  polyethylene glycol (MIRALAX / GLYCOLAX) packet Take 17 g by mouth daily. 10/02/18   Debbe Odea, MD  prochlorperazine (COMPAZINE) 10 MG tablet Take 1 tablet (10 mg total) by mouth every 6 (six) hours as needed for nausea or vomiting. 08/18/18   Wyatt Portela, MD  rosuvastatin (CRESTOR) 5 MG tablet Take 5 mg by mouth daily. 07/05/18   [provider]  senna-docusate (SENOKOT-S) 8.6-50 MG tablet Take 1 tablet by mouth daily as needed. Patient taking differently: Take 1 tablet by mouth daily as needed for mild constipation or  moderate constipation.  03/02/17   Robyn Haber, MD    Family History Family History  Problem Relation Age of Onset  . Leukemia Father     Social History Social History   Tobacco Use  . Smoking status: Never Smoker  . Smokeless tobacco: Never Used  Substance Use Topics  . Alcohol use: No  . Drug use: No     Allergies   Tramadol   Review of Systems Review of Systems  Constitutional: Positive for activity change, appetite change and fatigue. Negative for fever.  HENT: Negative for congestion and rhinorrhea.   Eyes: Negative for visual disturbance.  Respiratory: Negative for cough, chest tightness and shortness of breath.   Cardiovascular: Negative for chest pain.  Gastrointestinal: Positive for abdominal pain, nausea and vomiting.  Genitourinary: Negative for dysuria, hematuria, vaginal bleeding and vaginal discharge.  Musculoskeletal: Negative for arthralgias, back pain and myalgias.  Skin: Negative for rash.  Neurological: Negative for dizziness, weakness, light-headedness and headaches.    all other systems  are negative except as noted in the HPI and PMH.    Physical Exam Updated Vital Signs BP (!) 162/83   Pulse 87   Temp 98.9 F (37.2 C) (Oral)   Resp 17   SpO2 98%   Physical Exam  Constitutional: She is oriented to person, place, and time. She appears well-developed and well-nourished. No distress.  Chronically ill appearing  HENT:  Head: Normocephalic and atraumatic.  Mouth/Throat: Oropharynx is clear and moist. No oropharyngeal exudate.  Dry mucus membranes  Eyes: Pupils are equal, round, and reactive to light. Conjunctivae and EOM are normal.  Neck: Normal range of motion. Neck supple.  No meningismus.  Cardiovascular: Normal rate, regular rhythm, normal heart sounds and intact distal pulses.  No murmur heard. Pulmonary/Chest: Effort normal and breath sounds normal. No respiratory distress.  Abdominal: Soft. There is tenderness. There is no  rebound and no guarding.  Mild diffuse tenderness  Musculoskeletal: Normal range of motion. She exhibits tenderness. She exhibits no edema.  Neurological: She is alert and oriented to person, place, and time. No cranial nerve deficit. She exhibits normal muscle tone. Coordination normal.  No ataxia on finger to nose bilaterally. No pronator drift. 5/5 strength throughout. CN 2-12 intact.Equal grip strength. Sensation intact.   Skin: Skin is warm.  Psychiatric: She has a normal mood and affect. Her behavior is normal.  Nursing note and vitals reviewed.    ED Treatments / Results  Labs (all labs ordered are listed, but only abnormal results are displayed) Labs Reviewed  COMPREHENSIVE METABOLIC PANEL - Abnormal; Notable for the following components:      Result Value   Potassium 2.8 (*)    BUN 7 (*)    Albumin 3.1 (*)    Anion gap 16 (*)    All other components within normal limits  CBC - Abnormal; Notable for the following components:   WBC 2.8 (*)    RBC 3.57 (*)    Hemoglobin 9.2 (*)    HCT 28.8 (*)    MCH 25.8 (*)    Platelets 121 (*)    All other components within normal limits  LIPASE, BLOOD  PROTIME-INR  APTT  URINALYSIS, ROUTINE W REFLEX MICROSCOPIC  OCCULT BLOOD X 1 CARD TO LAB, STOOL  I-STAT CG4 LACTIC ACID, ED  TYPE AND SCREEN  ABO/RH    EKG None  Radiology Ct Abdomen Pelvis W Contrast  Result Date: 10/30/2018 CLINICAL DATA:  Acute abdominal pain. History of bladder cancer. EXAM: CT ABDOMEN AND PELVIS WITH CONTRAST TECHNIQUE: Multidetector CT imaging of the abdomen and pelvis was performed using the standard protocol following bolus administration of intravenous contrast. CONTRAST:  144mL ISOVUE-300 IOPAMIDOL (ISOVUE-300) INJECTION 61% COMPARISON:  09/03/2018 FINDINGS: Lower chest: Small but increasing bilateral pleural effusions with basilar atelectasis. Greater on the left. Lung base nodules again demonstrated. Hepatobiliary: Multiple hypoenhancing liver  lesions, likely metastatic. These are increased in conspicuity , size and number since previous study. Largest is in segment 6 and measures about 2.1 cm diameter. Layering density in the gallbladder likely sludge. No bile duct dilatation. Pancreas: Unremarkable. No pancreatic ductal dilatation or surrounding inflammatory changes. Spleen: Normal in size without focal abnormality. Adrenals/Urinary Tract: No adrenal gland nodules. Cyst on the left kidney. No hydronephrosis or hydroureter. Bladder is unremarkable. Stomach/Bowel: Stomach is within normal limits. Appendix is not identified. No evidence of bowel wall thickening, distention, or inflammatory changes. Vascular/Lymphatic: Aortic atherosclerosis. No enlarged abdominal or pelvic lymph nodes. Probable filling defect in the right external  iliac and femoral veins suggesting DVT. Ultrasound correlation suggested. Reproductive: Uterus and bilateral adnexa are unremarkable. Other: Small umbilical hernia containing fat. No free air or free fluid in the abdomen. Musculoskeletal: Multiple sclerotic bone lesions consistent with metastases. Superior endplate compression at L1. IMPRESSION: 1. Small but increasing bilateral pleural effusions with basilar atelectasis, greater on the left. Lung base nodules again demonstrated. Likely metastatic. 2. Multiple hypoenhancing liver lesions, increased in conspicuity since previous study, likely representing metastatic disease. 3. Multiple sclerotic bone metastases. 4. Filling defect in the right external iliac and femoral veins suggesting DVT. Aortic Atherosclerosis (ICD10-I70.0). These results were called by telephone at the time of interpretation on 10/30/2018 at 4:08 am to Dr. Ezequiel Essex , who verbally acknowledged these results. Electronically Signed   By: Lucienne Capers M.D.   On: 10/30/2018 04:10   Dg Abdomen Acute W/chest  Result Date: 10/30/2018 CLINICAL DATA:  Abdominal pain, nausea, and vomiting. History of  bladder cancer. EXAM: DG ABDOMEN ACUTE W/ 1V CHEST COMPARISON:  09/28/2018 FINDINGS: A right jugular Port-A-Cath remains in place with tip over the mid SVC. The cardiomediastinal silhouette is unchanged with cardiac size upper limits of normal. Scarring or atelectasis is again noted in the left greater than right lower lungs. No sizable pleural effusion or pneumothorax is identified. No intraperitoneal free air is identified on the lateral decubitus radiograph. Scattered bowel gas is present throughout the abdomen. No dilated loops of bowel are seen to suggest obstruction. No acute osseous abnormality is identified. IMPRESSION: 1. Bibasilar scarring/atelectasis without evidence of acute cardiopulmonary disease. 2. Nonobstructed bowel gas pattern. Electronically Signed   By: Logan Bores M.D.   On: 10/30/2018 02:17    Procedures Procedures (including critical care time)  Medications Ordered in ED Medications  sodium chloride 0.9 % bolus 1,000 mL (1,000 mLs Intravenous New Bag/Given 10/30/18 0203)  ondansetron (ZOFRAN-ODT) disintegrating tablet 4 mg (4 mg Oral Given 10/30/18 0206)     Initial Impression / Assessment and Plan / ED Course  I have reviewed the triage vital signs and the nursing notes.  Pertinent labs & imaging results that were available during my care of the patient were reviewed by me and considered in my medical decision making (see chart for details).    Cancer  Patient with nausea and vomiting, abdominal pain. Recent admission for same. No fever.  Poor PO intake at home. Last chemo 10.25.   IVF, symptom control.   Work-up shows hypokalemia which was repleted.  Patient given IV hydration.  CT scan shows worsening bony and liver mets as well as enlarging pleural effusions.  She has no respiratory distress.  There is concern for right external iliac and femoral DVT on CT scan.  Patient does admit to pain in this area for the past several days.  Distal pulses intact.  Will be  started on weight-based Lovenox.  Plan admission for intractable nausea and vomiting and hypokalemia.  Nausea and abdominal pain persists. Unable to tolerate PO. CT with worsening areas of mets to bones and liver.  Plan admission for intractable nausea and vomiting. New DVT to be treated with lovenox.  D/w Dr. Blaine Hamper.  Final Clinical Impressions(s) / ED Diagnoses   Final diagnoses:  Intractable nausea and vomiting  Hypokalemia  Acute deep vein thrombosis (DVT) of femoral vein of right lower extremity [facility])    ED Discharge Orders    None       Ezequiel Essex, MD 10/30/18 [id number]

## 2018-10-30 NOTE — Progress Notes (Signed)
IP PROGRESS NOTE  Subjective:   Events noted in the last 24 hours.  67 year old woman with advanced bladder cancer currently receiving systemic chemotherapy hospitalized overnight with symptoms of nausea and vomiting and abdominal discomfort.  CT scan of the abdomen pelvis was personally reviewed which showed bilateral pleural effusion, hepatic metastasis and potentially deep vein thrombosis.  Clinically, she has reported despite outpatient aggressive management with antiemetics and IV hydration she still not able to tolerate food with symptoms of persistent nausea and vomiting.  He also reported epigastric bandlike abdominal discomfort.  She denies any headaches or blurry vision.  She denies any neurological deficits.  She denies any chest pain or palpitation.  She denies any changes in bowel habits.  She denies any bleeding or clotting tendency.  Remaining review of system is negative.  Objective:  Vital signs in last 24 hours: Temp:  [98.9 F (37.2 C)] 98.9 F (37.2 C) (11/04 0050) Pulse Rate:  [80-112] 86 (11/04 0600) Resp:  [15-17] 15 (11/04 0600) BP: (158-187)/(76-111) 158/85 (11/04 0600) SpO2:  [96 %-100 %] 100 % (11/04 0600) Weight change:  Last BM Date: 10/27/18  Intake/Output from previous day: 11/03 0701 - 11/04 0700 In: 1100 [IV Piggyback:1100] Out: -  General: Chronically ill-appearing without distress. Head: Normocephalic atraumatic. Mouth: mucous membranes moist, pharynx normal without lesions Eyes: No scleral icterus.  Pupils are equal and round reactive to light. Resp: clear to auscultation bilaterally without rhonchi or wheezes or dullness to percussion. Cardio: regular rate and rhythm, S1, S2 normal, no murmur, click, rub or gallop GI: soft, non-tender; bowel sounds normal; no masses,  no organomegaly Musculoskeletal: No joint deformity or effusion. Neurological: No motor, sensory deficits.  Intact deep tendon reflexes. Skin: No rashes or  lesions.  Portacath-without erythema  Lab Results: Recent Labs    10/30/18 0201  WBC 2.8*  HGB 9.2*  HCT 28.8*  PLT 121*    BMET Recent Labs    10/30/18 0201  NA 138  K 2.8*  CL 98  CO2 24  GLUCOSE 93  BUN 7*  CREATININE 0.58  CALCIUM 9.3    Studies/Results: Ct Abdomen Pelvis W Contrast  Result Date: 10/30/2018 CLINICAL DATA:  Acute abdominal pain. History of bladder cancer. EXAM: CT ABDOMEN AND PELVIS WITH CONTRAST TECHNIQUE: Multidetector CT imaging of the abdomen and pelvis was performed using the standard protocol following bolus administration of intravenous contrast. CONTRAST:  126mL ISOVUE-300 IOPAMIDOL (ISOVUE-300) INJECTION 61% COMPARISON:  09/03/2018 FINDINGS: Lower chest: Small but increasing bilateral pleural effusions with basilar atelectasis. Greater on the left. Lung base nodules again demonstrated. Hepatobiliary: Multiple hypoenhancing liver lesions, likely metastatic. These are increased in conspicuity , size and number since previous study. Largest is in segment 6 and measures about 2.1 cm diameter. Layering density in the gallbladder likely sludge. No bile duct dilatation. Pancreas: Unremarkable. No pancreatic ductal dilatation or surrounding inflammatory changes. Spleen: Normal in size without focal abnormality. Adrenals/Urinary Tract: No adrenal gland nodules. Cyst on the left kidney. No hydronephrosis or hydroureter. Bladder is unremarkable. Stomach/Bowel: Stomach is within normal limits. Appendix is not identified. No evidence of bowel wall thickening, distention, or inflammatory changes. Vascular/Lymphatic: Aortic atherosclerosis. No enlarged abdominal or pelvic lymph nodes. Probable filling defect in the right external iliac and femoral veins suggesting DVT. Ultrasound correlation suggested. Reproductive: Uterus and bilateral adnexa are unremarkable. Other: Small umbilical hernia containing fat. No free air or free fluid in the abdomen. Musculoskeletal:  Multiple sclerotic bone lesions consistent with metastases. Superior endplate compression at  L1. IMPRESSION: 1. Small but increasing bilateral pleural effusions with basilar atelectasis, greater on the left. Lung base nodules again demonstrated. Likely metastatic. 2. Multiple hypoenhancing liver lesions, increased in conspicuity since previous study, likely representing metastatic disease. 3. Multiple sclerotic bone metastases. 4. Filling defect in the right external iliac and femoral veins suggesting DVT. Aortic Atherosclerosis (ICD10-I70.0). These results were called by telephone at the time of interpretation on 10/30/2018 at 4:08 am to Dr. Ezequiel Essex , who verbally acknowledged these results. Electronically Signed   By: Lucienne Capers M.D.   On: 10/30/2018 04:10   Dg Abdomen Acute W/chest  Result Date: 10/30/2018 CLINICAL DATA:  Abdominal pain, nausea, and vomiting. History of bladder cancer. EXAM: DG ABDOMEN ACUTE W/ 1V CHEST COMPARISON:  09/28/2018 FINDINGS: A right jugular Port-A-Cath remains in place with tip over the mid SVC. The cardiomediastinal silhouette is unchanged with cardiac size upper limits of normal. Scarring or atelectasis is again noted in the left greater than right lower lungs. No sizable pleural effusion or pneumothorax is identified. No intraperitoneal free air is identified on the lateral decubitus radiograph. Scattered bowel gas is present throughout the abdomen. No dilated loops of bowel are seen to suggest obstruction. No acute osseous abnormality is identified. IMPRESSION: 1. Bibasilar scarring/atelectasis without evidence of acute cardiopulmonary disease. 2. Nonobstructed bowel gas pattern. Electronically Signed   By: Logan Bores M.D.   On: 10/30/2018 02:17    Medications: I have reviewed the patient's current medications.  Assessment/Plan:  67 year old woman with the following:  1.  Metastatic bladder cancer: The natural course of this disease was discussed with  the patient and her family that were present today.  She has received chemotherapy on 2 separate occasions despite that she has not had any improvement in her disease status as well as clinical status.  She continues to functionally decline with worsening symptoms of nausea, vomiting and no increased abdominal pain.  CT scan of the abdomen pelvis obtained on 10/30/2018 was personally reviewed and showed overall worsening disease without any improvement despite chemotherapy.  These findings are very discouraging and the fact that she has had multiple hospitalizations also not a good prognostic sign.  Options of therapy were reviewed today with the patient and her family.  Aggressive approaches to treat her symptoms at this time and consider different salvage chemotherapy upon her discharge.  The other approach is to proceed with comfort care and hospice upon her discharge.  I favor hospice approach at this time given the severity of her illness and her decline in overall status.  Patient and her family will consider these options at this time.  We have also discussed the role of hospice if she elects to proceed with that.  Whether that would be in the form of home hospice and subsequently residential hospice if her symptoms worsen.  2.  Intractable nausea and vomiting: Related to malignancy.  I would treat symptomatically as you are doing at this time.  I recommend palliative medicine consult for symptom management as well as clarifying goals of care.  3.  Deep vein thrombosis: I agree with Lovenox long-term at this time.  4.  Prognosis: Poor with limited life expectancy.  She is a hospice candidate if she elects to proceed at this time.  I anticipate life expectancy of less than 6 months.  All her questions and her family's questions addressed today.   40  minutes was spent with the patient face-to-face today.  More than 50% of time  was dedicated to reviewing laboratory data, imaging studies and  discussing her disease status including treatment options, prognosis and options of therapy including hospice.      LOS: 0 days   Zola Button 10/30/2018, 9:32 AM

## 2018-10-30 NOTE — Progress Notes (Signed)
ANTICOAGULATION CONSULT NOTE - Initial Consult  Pharmacy Consult for Lovenox Indication: DVT  Allergies  Allergen Reactions  . Tramadol Nausea And Vomiting    Patient Measurements: Height: 5\' 1"  (154.9 cm) IBW/kg (Calculated) : 47.8  Weight: 157 lbs (71 kg) on 09/28/2018  Vital Signs: BP: 158/85 (11/04 0600) Pulse Rate: 86 (11/04 0600)  Labs: Recent Labs    10/30/18 0201 10/30/18 0538  HGB 9.2*  --   HCT 28.8*  --   PLT 121*  --   APTT  --  28  LABPROT  --  15.1  INR  --  1.20  CREATININE 0.58  --     CrCl cannot be calculated (Unknown ideal weight.).   Medical History: Past Medical History:  Diagnosis Date  . Anemia due to acute blood loss 11/24/2016   TRANSFUSED PRBC X2 UNITS 11-25-2016  . Bladder neoplasm   . Cancer ([place])   . Constipation   . Gross hematuria   . Hypertension   . Urgency of urination     Medications:  No oral anticoagulation PTA  Assessment:  67 yr female presents with nausea/vomiting.    PMH significant for metastatic bladder cancer, undergoing chemotherapy  CT suggests right external iliac and femoral DVT  Patient received Lovenox 70mg  sq x 1 dose @ 05:50 on 11/4  Pharmacy consulted to continue dosing of Lovenox for DVT  Goal of Therapy:  Monitor platelets by anticoagulation protocol: Yes   Plan:   Lovenox 70mg  sq q12h  Follow CBC and SCr while on Lovenox  Anays Detore, Toribio Harbour, PharmD 10/30/2018,3:40 PM

## 2018-10-30 NOTE — ED Notes (Signed)
ED TO INPATIENT HANDOFF REPORT  Name/Age/Gender Faith [name] 67 y.o. female  Code Status    Code Status Orders  (From admission, onward)         Start     Ordered   10/30/18 0514  Full code  Continuous     10/30/18 0513        Code Status History    Date Active Date Inactive Code Status Order ID Comments User Context   09/28/2018 0207 10/02/2018 1723 Full Code [id number]  Rise Patience, MD ED   09/03/2018 1956 09/09/2018 0046 Full Code [id number]  Harvie Bridge, DO Inpatient   11/25/2016 0057 11/26/2016 1630 Full Code [id number]  Etta Quill, DO ED      Home/SNF/Other Home  Chief Complaint Cancer pt; Emesis; Body Pain   Level of Care/Admitting Diagnosis ED Disposition    ED Disposition Condition Comment   Admit  Hospital Area: [facility] [[id number]]  Level of Care: Med-Surg [16]  Diagnosis: Intractable nausea and vomiting [103159]  Admitting Physician: Ivor Costa [[id number]]  Attending Physician: Ivor Costa [[id number]]  PT Class (Do Not Modify): Observation [104]  PT Acc Code (Do Not Modify): Observation [10022]       Medical History Past Medical History:  Diagnosis Date  . Anemia due to acute blood loss 11/24/2016   TRANSFUSED PRBC X2 UNITS 11-25-2016  . Bladder neoplasm   . Cancer ([place])   . Constipation   . Gross hematuria   . Hypertension   . Urgency of urination     Allergies Allergies  Allergen Reactions  . Tramadol Nausea And Vomiting    IV Location/Drains/Wounds Patient Lines/Drains/Airways Status   Active Line/Drains/Airways    Name:   Placement date:   Placement time:   Site:   Days:   Implanted Port   -    -    -      Ureteral Drain/Stent Right ureter 6 Fr.   12/28/16    1024    Right ureter   671   Incision (Closed) 12/28/16 Perineum Other (Comment)   12/28/16    1026     671          Labs/Imaging Results for orders placed or performed during the hospital encounter of 10/30/18 (from the past 48 hour(s))   Lipase, blood     Status: None   Collection Time: 10/30/18  2:01 AM  Result Value Ref Range   Lipase 12 11 - 51 U/L    Comment: Performed at [name], [place] [address], [place], [place] [address]  Comprehensive metabolic panel     Status: Abnormal   Collection Time: 10/30/18  2:01 AM  Result Value Ref Range   Sodium 138 135 - 145 mmol/L   Potassium 2.8 (L) 3.5 - 5.1 mmol/L   Chloride 98 98 - 111 mmol/L   CO2 24 22 - 32 mmol/L   Glucose, Bld 93 70 - 99 mg/dL   BUN 7 (L) 8 - 23 mg/dL   Creatinine, Ser 0.58 0.44 - 1.00 mg/dL   Calcium 9.3 8.9 - 10.3 mg/dL   Total Protein 7.0 6.5 - 8.1 g/dL   Albumin 3.1 (L) 3.5 - 5.0 g/dL   AST 23 15 - 41 U/L   ALT 14 0 - 44 U/L   Alkaline Phosphatase 85 38 - 126 U/L   Total Bilirubin 1.0 0.3 - 1.2 mg/dL   GFR calc non Af Amer >60 >60 mL/min  GFR calc Af Amer >60 >60 mL/min    Comment: (NOTE) The eGFR has been calculated using the CKD EPI equation. This calculation has not been validated in all clinical situations. eGFR's persistently <60 mL/min signify possible Chronic Kidney Disease.    Anion gap 16 (H) 5 - 15    Comment: Performed at [facility], [place] [address] [place], [place] [address]  CBC     Status: Abnormal   Collection Time: 10/30/18  2:01 AM  Result Value Ref Range   WBC 2.8 (L) 4.0 - 10.5 K/uL   RBC 3.57 (L) 3.87 - 5.11 MIL/uL   Hemoglobin 9.2 (L) 12.0 - 15.0 g/dL   HCT 28.8 (L) 36.0 - 46.0 %   MCV 80.7 80.0 - 100.0 fL   MCH 25.8 (L) 26.0 - 34.0 pg   MCHC 31.9 30.0 - 36.0 g/dL   RDW 14.5 11.5 - 15.5 %   Platelets 121 (L) 150 - 400 K/uL   nRBC 0.0 0.0 - 0.2 %    Comment: Performed at [name], [place] [address] [place], [place] [address]  I-Stat CG4 Lactic Acid, ED     Status: None   Collection Time: 10/30/18  2:11 AM  Result Value Ref Range   Lactic Acid, Venous 1.20 0.5 - 1.9 mmol/L   Ct Abdomen Pelvis W Contrast  Result Date: 10/30/2018 CLINICAL DATA:   Acute abdominal pain. History of bladder cancer. EXAM: CT ABDOMEN AND PELVIS WITH CONTRAST TECHNIQUE: Multidetector CT imaging of the abdomen and pelvis was performed using the standard protocol following bolus administration of intravenous contrast. CONTRAST:  137m ISOVUE-300 IOPAMIDOL (ISOVUE-300) INJECTION 61% COMPARISON:  09/03/2018 FINDINGS: Lower chest: Small but increasing bilateral pleural effusions with basilar atelectasis. Greater on the left. Lung base nodules again demonstrated. Hepatobiliary: Multiple hypoenhancing liver lesions, likely metastatic. These are increased in conspicuity , size and number since previous study. Largest is in segment 6 and measures about 2.1 cm diameter. Layering density in the gallbladder likely sludge. No bile duct dilatation. Pancreas: Unremarkable. No pancreatic ductal dilatation or surrounding inflammatory changes. Spleen: Normal in size without focal abnormality. Adrenals/Urinary Tract: No adrenal gland nodules. Cyst on the left kidney. No hydronephrosis or hydroureter. Bladder is unremarkable. Stomach/Bowel: Stomach is within normal limits. Appendix is not identified. No evidence of bowel wall thickening, distention, or inflammatory changes. Vascular/Lymphatic: Aortic atherosclerosis. No enlarged abdominal or pelvic lymph nodes. Probable filling defect in the right external iliac and femoral veins suggesting DVT. Ultrasound correlation suggested. Reproductive: Uterus and bilateral adnexa are unremarkable. Other: Small umbilical hernia containing fat. No free air or free fluid in the abdomen. Musculoskeletal: Multiple sclerotic bone lesions consistent with metastases. Superior endplate compression at L1. IMPRESSION: 1. Small but increasing bilateral pleural effusions with basilar atelectasis, greater on the left. Lung base nodules again demonstrated. Likely metastatic. 2. Multiple hypoenhancing liver lesions, increased in conspicuity since previous study, likely  representing metastatic disease. 3. Multiple sclerotic bone metastases. 4. Filling defect in the right external iliac and femoral veins suggesting DVT. Aortic Atherosclerosis (ICD10-I70.0). These results were called by telephone at the time of interpretation on 10/30/2018 at 4:08 am to Dr. SEzequiel Essex, who verbally acknowledged these results. Electronically Signed   By: WLucienne CapersM.D.   On: 10/30/2018 04:10   Dg Abdomen Acute W/chest  Result Date: 10/30/2018 CLINICAL DATA:  Abdominal pain, nausea, and vomiting. History of bladder cancer. EXAM: DG ABDOMEN ACUTE W/ 1V CHEST COMPARISON:  09/28/2018 FINDINGS: A right  jugular Port-A-Cath remains in place with tip over the mid SVC. The cardiomediastinal silhouette is unchanged with cardiac size upper limits of normal. Scarring or atelectasis is again noted in the left greater than right lower lungs. No sizable pleural effusion or pneumothorax is identified. No intraperitoneal free air is identified on the lateral decubitus radiograph. Scattered bowel gas is present throughout the abdomen. No dilated loops of bowel are seen to suggest obstruction. No acute osseous abnormality is identified. IMPRESSION: 1. Bibasilar scarring/atelectasis without evidence of acute cardiopulmonary disease. 2. Nonobstructed bowel gas pattern. Electronically Signed   By: Logan Bores M.D.   On: 10/30/2018 02:17   None  Pending Labs Unresulted Labs (From admission, onward)    Start     Ordered   10/31/18 0500  Magnesium  Tomorrow morning,   R     10/30/18 0510   10/30/18 0512  Occult blood card to lab, stool  Once,   R     10/30/18 0511   10/30/18 0512  Protime-INR  Once,   R     10/30/18 0512   10/30/18 0512  APTT  Once,   R     10/30/18 0512   10/30/18 0512  Type and screen [facility]  Once,   R    Comments:  [place]    10/30/18 0512   10/30/18 0057  Urinalysis, Routine w reflex microscopic  STAT,   STAT     10/30/18  0057          Vitals/Pain Today's Vitals   10/30/18 0203 10/30/18 0300 10/30/18 0442 10/30/18 0523  BP:  (!) 162/76  (!) 168/97  Pulse:  80  85  Resp:  16  16  Temp:      TempSrc:      SpO2:  97%  96%  PainSc: 10-Worst pain ever  9      Isolation Precautions No active isolations  Medications Medications  potassium chloride 10 mEq in 100 mL IVPB (10 mEq Intravenous New Bag/Given 10/30/18 0541)  acetaminophen (TYLENOL) tablet 650 mg (has no administration in time range)  oxyCODONE-acetaminophen (PERCOCET/ROXICET) 5-325 MG per tablet 1 tablet (has no administration in time range)  metoprolol succinate (TOPROL-XL) 24 hr tablet 100 mg (has no administration in time range)  irbesartan (AVAPRO) tablet 150 mg (has no administration in time range)  rosuvastatin (CRESTOR) tablet 5 mg (has no administration in time range)  LORazepam (ATIVAN) tablet 0.5 mg (has no administration in time range)  pantoprazole (PROTONIX) EC tablet 40 mg (has no administration in time range)  polyethylene glycol (MIRALAX / GLYCOLAX) packet 17 g (has no administration in time range)  senna-docusate (Senokot-S) tablet 1 tablet (has no administration in time range)  magnesium sulfate IVPB 1 g 100 mL (has no administration in time range)  potassium chloride 20 MEQ/15ML (10%) solution 40 mEq (has no administration in time range)  famotidine (PEPCID) IVPB 20 mg premix (has no administration in time range)  0.9 %  sodium chloride infusion ( Intravenous New Bag/Given 10/30/18 0551)  heparin injection 5,000 Units (has no administration in time range)  ondansetron (ZOFRAN) tablet 4 mg (has no administration in time range)    Or  ondansetron (ZOFRAN) injection 4 mg (has no administration in time range)  hydrALAZINE (APRESOLINE) injection 5 mg (has no administration in time range)  zolpidem (AMBIEN) tablet 5 mg (has no administration in time range)  sucralfate (CARAFATE) tablet 1 g (has no administration in time range)  ondansetron (ZOFRAN-ODT) disintegrating tablet 4 mg (4 mg Oral Given 10/30/18 0206)  sodium chloride 0.9 % bolus 1,000 mL (0 mLs Intravenous Stopped 10/30/18 0332)  fentaNYL (SUBLIMAZE) injection 50 mcg (50 mcg Intravenous Given 10/30/18 0335)  iopamidol (ISOVUE-300) 61 % injection 100 mL (100 mLs Intravenous Contrast Given 10/30/18 0346)  enoxaparin (LOVENOX) injection 70 mg (70 mg Subcutaneous Given 10/30/18 0550)    Mobility non-ambulatory

## 2018-10-30 NOTE — ED Notes (Signed)
Patient transported to X-ray 

## 2018-10-30 NOTE — Progress Notes (Signed)
Night nurse reminded to draw BMP one hour after the last potassium given.

## 2018-10-30 NOTE — Progress Notes (Signed)
PROGRESS NOTE    KONSTANTINA NACHREINER   ZOX:[id number]  DOB: 07-09-1951  DOA: 10/30/2018 PCP: Fanny Bien, MD   Brief Narrative:  Faith [name] is a 67 y.o. female with medical history significant of metastasized bladder cancer (s/p of surgery, radiation, last chemo on 10/25), hypertension, hyperlipidemia, GERD, anxiety, anemia, who presents with intractable nausea and vomiting for several days an burning circumferential upper abd and mid back pain.  Patient was recently hospitalized from 10/2-10/7 because of intractable nausea and vomiting for almost 2 months.  Patient had EGD, which showed nonbleeding gastric ulcer with no stigmata of bleeding.  Abdominal ultrasound showed gallbladder sludge without cholelithiasis or acute cholecystitis.     PPI was increased to BID  In ED> WBC 2.8, platelet 121, hemoglobin 9.2 CT abdomen/pelvis showed metastasized liver disease, and possible right external iliac and femoral vein DVT.  Subjective: Pain controlled currently with pain medications. Has nausea and not able to drink fluids. No diarrhea.     Assessment & Plan:   Principal Problem:   Intractable nausea and vomiting/ PUD (peptic ulcer disease) - BID Protonix- cannot tolerate sucralfate - Zofran QID - PRN phenergan - d/c all oral medications - cont IVF  Active Problems:   Hypokalemia - replacing- repeat today    DVT (deep venous thrombosis)  - Heparin    HTN (hypertension) - IV Lopressor routine and PRN Hydralazine- Norvasc and Benicar on hold    Urothelial carcinoma of bladder with pain - Dr Alen Blew has recommended palliative care discussion- life expectancy < 6 mo - discussed with patient in detail today - palliative consult recommended - IV Morphine ordered    Hyperlipidemia - hold statin    Pancytopenia   - follow    Anxiety - PRN IV Lorazepam    DVT prophylaxis: Heparin Code Status: Full code Family Communication:  Disposition Plan: palliative discussion-  likely home with hospice Consultants:   oncology Procedures:     Antimicrobials:  Anti-infectives (From admission, onward)   None       Objective: Vitals:   10/30/18 0200 10/30/18 0300 10/30/18 0523 10/30/18 0600  BP: (!) 162/83 (!) 162/76 (!) 168/97 (!) 158/85  Pulse: 87 80 85 86  Resp: 17 16 16 15   Temp:      TempSrc:      SpO2: 98% 97% 96% 100%    Intake/Output Summary (Last 24 hours) at 10/30/2018 1040 Last data filed at 10/30/2018 0439 Gross per 24 hour  Intake 1100 ml  Output -  Net 1100 ml   There were no vitals filed for this visit.  Examination: General exam: Appears comfortable  HEENT: PERRLA, oral mucosa moist, no sclera icterus or thrush Respiratory system: Clear to auscultation. Respiratory effort normal. Cardiovascular system: S1 & S2 heard, RRR.   Gastrointestinal system: Abdomen soft, non-tender, nondistended. Normal bowel sounds. Central nervous system: Alert and oriented. No focal neurological deficits. Extremities: No cyanosis, clubbing or edema Skin: No rashes or ulcers Psychiatry:  Mood & affect appropriate.     Data Reviewed: I have personally reviewed following labs and imaging studies  CBC: Recent Labs  Lab 10/30/18 0201  WBC 2.8*  HGB 9.2*  HCT 28.8*  MCV 80.7  PLT 811*   Basic Metabolic Panel: Recent Labs  Lab 10/30/18 0201  NA 138  K 2.8*  CL 98  CO2 24  GLUCOSE 93  BUN 7*  CREATININE 0.58  CALCIUM 9.3   GFR: CrCl cannot be calculated (Unknown ideal weight.). Liver  Function Tests: Recent Labs  Lab 10/30/18 0201  AST 23  ALT 14  ALKPHOS 85  BILITOT 1.0  PROT 7.0  ALBUMIN 3.1*   Recent Labs  Lab 10/30/18 0201  LIPASE 12   No results for input(s): AMMONIA in the last 168 hours. Coagulation Profile: Recent Labs  Lab 10/30/18 0538  INR 1.20   Cardiac Enzymes: No results for input(s): CKTOTAL, CKMB, CKMBINDEX, TROPONINI in the last 168 hours. BNP (last 3 results) No results for input(s): PROBNP in  the last 8760 hours. HbA1C: No results for input(s): HGBA1C in the last 72 hours. CBG: No results for input(s): GLUCAP in the last 168 hours. Lipid Profile: No results for input(s): CHOL, HDL, LDLCALC, TRIG, CHOLHDL, LDLDIRECT in the last 72 hours. Thyroid Function Tests: No results for input(s): TSH, T4TOTAL, FREET4, T3FREE, THYROIDAB in the last 72 hours. Anemia Panel: No results for input(s): VITAMINB12, FOLATE, FERRITIN, TIBC, IRON, RETICCTPCT in the last 72 hours. Urine analysis:    Component Value Date/Time   COLORURINE YELLOW 09/28/2018 0337   APPEARANCEUR CLOUDY (A) 09/28/2018 0337   LABSPEC 1.023 09/28/2018 0337   PHURINE 5.0 09/28/2018 0337   GLUCOSEU NEGATIVE 09/28/2018 0337   HGBUR MODERATE (A) 09/28/2018 0337   BILIRUBINUR NEGATIVE 09/28/2018 0337   KETONESUR 80 (A) 09/28/2018 0337   PROTEINUR 100 (A) 09/28/2018 0337   NITRITE NEGATIVE 09/28/2018 0337   LEUKOCYTESUR NEGATIVE 09/28/2018 0337   Sepsis Labs: @LABRCNTIP (procalcitonin:4,lacticidven:4) )No results found for this or any previous visit (from the past 240 hour(s)).       Radiology Studies: Ct Abdomen Pelvis W Contrast  Result Date: 10/30/2018 CLINICAL DATA:  Acute abdominal pain. History of bladder cancer. EXAM: CT ABDOMEN AND PELVIS WITH CONTRAST TECHNIQUE: Multidetector CT imaging of the abdomen and pelvis was performed using the standard protocol following bolus administration of intravenous contrast. CONTRAST:  166mL ISOVUE-300 IOPAMIDOL (ISOVUE-300) INJECTION 61% COMPARISON:  09/03/2018 FINDINGS: Lower chest: Small but increasing bilateral pleural effusions with basilar atelectasis. Greater on the left. Lung base nodules again demonstrated. Hepatobiliary: Multiple hypoenhancing liver lesions, likely metastatic. These are increased in conspicuity , size and number since previous study. Largest is in segment 6 and measures about 2.1 cm diameter. Layering density in the gallbladder likely sludge. No bile  duct dilatation. Pancreas: Unremarkable. No pancreatic ductal dilatation or surrounding inflammatory changes. Spleen: Normal in size without focal abnormality. Adrenals/Urinary Tract: No adrenal gland nodules. Cyst on the left kidney. No hydronephrosis or hydroureter. Bladder is unremarkable. Stomach/Bowel: Stomach is within normal limits. Appendix is not identified. No evidence of bowel wall thickening, distention, or inflammatory changes. Vascular/Lymphatic: Aortic atherosclerosis. No enlarged abdominal or pelvic lymph nodes. Probable filling defect in the right external iliac and femoral veins suggesting DVT. Ultrasound correlation suggested. Reproductive: Uterus and bilateral adnexa are unremarkable. Other: Small umbilical hernia containing fat. No free air or free fluid in the abdomen. Musculoskeletal: Multiple sclerotic bone lesions consistent with metastases. Superior endplate compression at L1. IMPRESSION: 1. Small but increasing bilateral pleural effusions with basilar atelectasis, greater on the left. Lung base nodules again demonstrated. Likely metastatic. 2. Multiple hypoenhancing liver lesions, increased in conspicuity since previous study, likely representing metastatic disease. 3. Multiple sclerotic bone metastases. 4. Filling defect in the right external iliac and femoral veins suggesting DVT. Aortic Atherosclerosis (ICD10-I70.0). These results were called by telephone at the time of interpretation on 10/30/2018 at 4:08 am to Dr. Ezequiel Essex , who verbally acknowledged these results. Electronically Signed   By: Oren Beckmann.D.  On: 10/30/2018 04:10   Dg Abdomen Acute W/chest  Result Date: 10/30/2018 CLINICAL DATA:  Abdominal pain, nausea, and vomiting. History of bladder cancer. EXAM: DG ABDOMEN ACUTE W/ 1V CHEST COMPARISON:  09/28/2018 FINDINGS: A right jugular Port-A-Cath remains in place with tip over the mid SVC. The cardiomediastinal silhouette is unchanged with cardiac size upper  limits of normal. Scarring or atelectasis is again noted in the left greater than right lower lungs. No sizable pleural effusion or pneumothorax is identified. No intraperitoneal free air is identified on the lateral decubitus radiograph. Scattered bowel gas is present throughout the abdomen. No dilated loops of bowel are seen to suggest obstruction. No acute osseous abnormality is identified. IMPRESSION: 1. Bibasilar scarring/atelectasis without evidence of acute cardiopulmonary disease. 2. Nonobstructed bowel gas pattern. Electronically Signed   By: Logan Bores M.D.   On: 10/30/2018 02:17      Scheduled Meds: . ferrous sulfate  325 mg Oral Daily  . [START ON 10/31/2018] heparin  5,000 Units Subcutaneous Q8H  . [START ON 10/31/2018] Influenza vac split quadrivalent PF  0.5 mL Intramuscular Tomorrow-1000  . irbesartan  150 mg Oral Daily  . metoprolol succinate  100 mg Oral Daily  . pantoprazole  40 mg Oral BID  . potassium chloride  40 mEq Oral Once  . rosuvastatin  5 mg Oral q1800  . sucralfate  1 g Oral TID WC & HS   Continuous Infusions: . sodium chloride 125 mL/hr at 10/30/18 0551  . famotidine (PEPCID) IV       LOS: 0 days    Time spent in minutes: 35    Debbe Odea, MD [facility] Pager: [email or web address] Password [email or web address] 10/30/2018, 10:40 AM

## 2018-10-31 ENCOUNTER — Inpatient Hospital Stay (HOSPITAL_COMMUNITY): Payer: Medicare Other

## 2018-10-31 DIAGNOSIS — E440 Moderate protein-calorie malnutrition: Secondary | ICD-10-CM

## 2018-10-31 DIAGNOSIS — Z86718 Personal history of other venous thrombosis and embolism: Secondary | ICD-10-CM

## 2018-10-31 LAB — CBC WITH DIFFERENTIAL/PLATELET
Abs Immature Granulocytes: 0 10*3/uL (ref 0.00–0.07)
BASOS PCT: 1 %
Basophils Absolute: 0 10*3/uL (ref 0.0–0.1)
EOS ABS: 0 10*3/uL (ref 0.0–0.5)
Eosinophils Relative: 2 %
HCT: 25.6 % — ABNORMAL LOW (ref 36.0–46.0)
Hemoglobin: 8 g/dL — ABNORMAL LOW (ref 12.0–15.0)
Immature Granulocytes: 0 %
Lymphocytes Relative: 20 %
Lymphs Abs: 0.2 10*3/uL — ABNORMAL LOW (ref 0.7–4.0)
MCH: 25.5 pg — AB (ref 26.0–34.0)
MCHC: 31.3 g/dL (ref 30.0–36.0)
MCV: 81.5 fL (ref 80.0–100.0)
MONO ABS: 0.2 10*3/uL (ref 0.1–1.0)
MONOS PCT: 17 %
NEUTROS ABS: 0.7 10*3/uL — AB (ref 1.7–7.7)
Neutrophils Relative %: 60 %
PLATELETS: 120 10*3/uL — AB (ref 150–400)
RBC: 3.14 MIL/uL — AB (ref 3.87–5.11)
RDW: 15.3 % (ref 11.5–15.5)
WBC: 1.1 10*3/uL — AB (ref 4.0–10.5)
nRBC: 0 % (ref 0.0–0.2)

## 2018-10-31 LAB — BASIC METABOLIC PANEL
Anion gap: 5 (ref 5–15)
CALCIUM: 8.8 mg/dL — AB (ref 8.9–10.3)
CO2: 27 mmol/L (ref 22–32)
CREATININE: 0.48 mg/dL (ref 0.44–1.00)
Chloride: 108 mmol/L (ref 98–111)
GFR calc Af Amer: >60 mL/min (ref >60)
GFR calc non Af Amer: >60 mL/min (ref >60)
Glucose, Bld: 150 mg/dL — ABNORMAL HIGH (ref 70–99)
POTASSIUM: 4 mmol/L (ref 3.5–5.1)
SODIUM: 140 mmol/L (ref 135–145)

## 2018-10-31 LAB — MAGNESIUM: Magnesium: 1.8 mg/dL (ref 1.7–2.4)

## 2018-10-31 MED ORDER — DEXAMETHASONE SODIUM PHOSPHATE 4 MG/ML IJ SOLN
2.0000 mg | Freq: Two times a day (BID) | INTRAMUSCULAR | Status: DC
  Administered 2018-10-31 – 2018-11-05 (×10): 2 mg via INTRAVENOUS
  Filled 2018-10-31 (×11): qty 1

## 2018-10-31 MED ORDER — OLANZAPINE 5 MG PO TBDP
5.0000 mg | ORAL_TABLET | Freq: Every day | ORAL | Status: DC
  Administered 2018-10-31 – 2018-11-04 (×5): 5 mg via ORAL
  Filled 2018-10-31 (×6): qty 1

## 2018-10-31 MED ORDER — PREGABALIN 25 MG PO CAPS
25.0000 mg | ORAL_CAPSULE | Freq: Two times a day (BID) | ORAL | Status: DC
  Administered 2018-10-31 – 2018-11-05 (×11): 25 mg via ORAL
  Filled 2018-10-31 (×12): qty 1

## 2018-10-31 MED ORDER — FENTANYL 12 MCG/HR TD PT72
25.0000 ug | MEDICATED_PATCH | TRANSDERMAL | Status: DC
  Administered 2018-10-31 – 2018-11-03 (×2): 25 ug via TRANSDERMAL
  Filled 2018-10-31 (×2): qty 2

## 2018-10-31 MED ORDER — FENTANYL CITRATE (PF) 100 MCG/2ML IJ SOLN
12.5000 ug | INTRAMUSCULAR | Status: DC | PRN
  Administered 2018-11-01: 12.5 ug via INTRAVENOUS
  Filled 2018-10-31 (×2): qty 2

## 2018-10-31 MED ORDER — TBO-FILGRASTIM 300 MCG/0.5ML ~~LOC~~ SOSY
300.0000 ug | PREFILLED_SYRINGE | Freq: Once | SUBCUTANEOUS | Status: AC
  Administered 2018-10-31: 300 ug via SUBCUTANEOUS
  Filled 2018-10-31: qty 0.5

## 2018-10-31 MED ORDER — ENOXAPARIN SODIUM 60 MG/0.6ML ~~LOC~~ SOLN
60.0000 mg | Freq: Two times a day (BID) | SUBCUTANEOUS | Status: DC
  Administered 2018-10-31 – 2018-11-01 (×2): 60 mg via SUBCUTANEOUS
  Filled 2018-10-31 (×2): qty 0.6

## 2018-10-31 MED ORDER — METOCLOPRAMIDE HCL 5 MG/ML IJ SOLN
5.0000 mg | Freq: Three times a day (TID) | INTRAMUSCULAR | Status: DC
  Administered 2018-10-31 – 2018-11-05 (×15): 5 mg via INTRAVENOUS
  Filled 2018-10-31 (×16): qty 2

## 2018-10-31 MED ORDER — FENTANYL 25 MCG/HR TD PT72: 25.0000 ug | MEDICATED_PATCH | TRANSDERMAL | Status: DC

## 2018-10-31 NOTE — Progress Notes (Signed)
PA for fentanyl patches has been approved from 10/31/18 through 11/01/19.

## 2018-10-31 NOTE — Progress Notes (Signed)
[place] for Lovenox Indication: DVT  Allergies  Allergen Reactions  . Tramadol Nausea And Vomiting    Patient Measurements: Height: 5\' 1"  (154.9 cm) Weight: 142 lb (64.4 kg) IBW/kg (Calculated) : 47.8  (down from 70.1 kg on 10/3)  Vital Signs: Temp: 98 F (36.7 C) (11/05 0443) Temp Source: Oral (11/05 0443) BP: 156/76 (11/05 0443) Pulse Rate: 71 (11/05 0443)  Labs: Recent Labs    10/30/18 0201 10/30/18 0538 10/31/18 0553  HGB 9.2*  --  8.0*  HCT 28.8*  --  25.6*  PLT 121*  --  120*  APTT  --  28  --   LABPROT  --  15.1  --   INR  --  1.20  --   CREATININE 0.58  --  0.48    Estimated Creatinine Clearance: 59.4 mL/min (by C-G formula based on SCr of 0.48 mg/dL).   Medical History: Past Medical History:  Diagnosis Date  . Anemia due to acute blood loss 11/24/2016   TRANSFUSED PRBC X2 UNITS 11-25-2016  . Bladder neoplasm   . Cancer ([place])   . Constipation   . Gross hematuria   . Hypertension   . Urgency of urination     Medications:  No oral anticoagulation PTA  Assessment: 67 yr female presents with nausea/vomiting. PMH significant for metastatic bladder cancer, undergoing chemotherapy. CT suggests right external iliac and femoral DVT and Pharmacy consulted to continue Lovenox dosing. Patient received Lovenox 70mg  sq x 1 dose @ 05:50 on 11/4.  Today, 10/31/2018:  Hgb low and decreasing; Plt low but stable  No documented bleeding; low cell counts are most likely d/t chemo and malignancy  CrCl stable ~60 ml/min  New lower weight updated in Epic  Goal of Therapy:  Monitor platelets by anticoagulation protocol: Yes   Plan:   Reduce Lovenox to 60 mg SQ bid (0.93 mg/kg) based on new weight  Follow CBC and SCr while on Lovenox  Monitor closely for bleeding  Reuel Boom, PharmD, BCPS [phone] 10/31/2018, 7:30 AM

## 2018-10-31 NOTE — Progress Notes (Signed)
CRITICAL VALUE ALERT  Critical Value:  WBC 1.1   Date & Time Notified: 10/31/18 5694  Provider Notified:  Silas [place]  Orders Received/Actions taken: no orders received at this time

## 2018-10-31 NOTE — Progress Notes (Signed)
PROGRESS NOTE    Faith [name]   IOM:[id number]  DOB: 1951/06/06  DOA: 10/30/2018 PCP: Fanny Bien, MD   Brief Narrative:  Faith [name] is a 67 y.o. female with medical history significant of metastasized bladder cancer (s/p of surgery, radiation, last chemo on 10/25), hypertension, hyperlipidemia, GERD, anxiety, anemia, who presents with intractable nausea and vomiting for several days an burning circumferential upper abd and mid back pain.  Patient was recently hospitalized from 10/2-10/7 because of intractable nausea and vomiting for almost 2 months.  Patient had EGD, which showed nonbleeding gastric ulcer with no stigmata of bleeding.  Abdominal ultrasound showed gallbladder sludge without cholelithiasis or acute cholecystitis.     PPI was increased to BID  In ED> WBC 2.8, platelet 121, hemoglobin 9.2 CT abdomen/pelvis showed metastasized liver disease, and possible right external iliac and femoral vein DVT.  Subjective: Still barely able to take sips of liquids. Had a good BM today after Dulcolax yesterday. Not yet gotten out of bed. Having the burning pain in a band around upper abdomen and back. In good mood, laughing and joking today    Assessment & Plan:   Principal Problem:   Intractable nausea and vomiting/ PUD (peptic ulcer disease) - BID Protonix- cannot tolerate sucralfate - Zofran QID - PRN phenergan - d/c all oral medications - cont IVF - cont with full liquids for now - she is not ready to advance  Active Problems:    Pancytopenia   - give Granix today as WBC is 1.1 - Hb 8.0- follow - Plt 120- cont Heparin for now   Hypokalemia - replaced- cont K in IVF    DVT (deep venous thrombosis)  - CT as above - venous duplex> acute deep vein thrombosis involving the right common femoral vein, right femoral vein, and right popliteal vein. - Heparin- unable to take orals    HTN (hypertension) - IV Lopressor routine and PRN Hydralazine- Norvasc and  Benicar on hold    Urothelial carcinoma of bladder with pain - Dr Alen Blew has recommended palliative care discussion- life expectancy < 6 mo - discussed with patient in detail today - palliative consult recommended - IV Morphine ordered - will add Lyrica today for the burning pain which may be related to spinal met- can increase as tolerated - palliative care added Fentanyl IV and patch- Oxycodone stopped    Hyperlipidemia - hold statin    Anxiety - PRN IV Lorazepam, Zyprexa added at bedtime    DVT prophylaxis: Heparin Code Status: Full code Family Communication:  Disposition Plan: palliative discussion- maybe home with hospice Consultants:   oncology Procedures:    LE Vascular duplex Right: Findings consistent with acute deep vein thrombosis involving the right common femoral vein, right femoral vein, and right popliteal vein. No cystic structure found in the popliteal fossa.  Left: There is no evidence of deep vein thrombosis in the lower extremity. No cystic structure found in the popliteal fossa.  Antimicrobials:  Anti-infectives (From admission, onward)   None       Objective: Vitals:   10/30/18 1444 10/30/18 2037 10/31/18 0443 10/31/18 1343  BP: (!) 167/89 (!) 177/99 (!) 156/76 (!) 160/89  Pulse: 95 86 71 62  Resp: _0 Temp: 97.9 F (36.6 C) (!) 97.5 F (36.4 C) 98 F (36.7 C) 97.8 F (36.6 C)  TempSrc: Oral Oral Oral Oral  SpO2: 98% 100% 98% 99%  Weight: 64.4 kg     Height:  Intake/Output Summary (Last 24 hours) at 10/31/2018 1648 Last data filed at 10/31/2018 1453 Gross per 24 hour  Intake 2954.38 ml  Output 1000 ml  Net 1954.38 ml   Filed Weights   10/30/18 1444  Weight: 64.4 kg    Examination: General exam: Appears comfortable  HEENT: PERRLA, oral mucosa moist, no sclera icterus or thrush Respiratory system: Clear to auscultation. Respiratory effort normal. Cardiovascular system: S1 & S2 heard,  No murmurs  Gastrointestinal  system: Abdomen soft, non-tender, nondistended. Normal bowel sound. No organomegaly Central nervous system: Alert and oriented. No focal neurological deficits. Extremities: No cyanosis, clubbing or edema Skin: No rashes or ulcers Psychiatry:  Mood & affect appropriate.     Data Reviewed: I have personally reviewed following labs and imaging studies  CBC: Recent Labs  Lab 10/30/18 0201 10/31/18 0553  WBC 2.8* 1.1*  NEUTROABS  --  0.7*  HGB 9.2* 8.0*  HCT 28.8* 25.6*  MCV 80.7 81.5  PLT 121* 628*   Basic Metabolic Panel: Recent Labs  Lab 10/30/18 0201 10/30/18 0205 10/31/18 0553  NA 138  --  140  K 2.8*  --  4.0  CL 98  --  108  CO2 24  --  27  GLUCOSE 93  --  150*  BUN 7*  --  <5*  CREATININE 0.58  --  0.48  CALCIUM 9.3  --  8.8*  MG  --  1.7 1.8   GFR: Estimated Creatinine Clearance: 59.4 mL/min (by C-G formula based on SCr of 0.48 mg/dL). Liver Function Tests: Recent Labs  Lab 10/30/18 0201  AST 23  ALT 14  ALKPHOS 85  BILITOT 1.0  PROT 7.0  ALBUMIN 3.1*   Recent Labs  Lab 10/30/18 0201  LIPASE 12   No results for input(s): AMMONIA in the last 168 hours. Coagulation Profile: Recent Labs  Lab 10/30/18 0538  INR 1.20   Cardiac Enzymes: No results for input(s): CKTOTAL, CKMB, CKMBINDEX, TROPONINI in the last 168 hours. BNP (last 3 results) No results for input(s): PROBNP in the last 8760 hours. HbA1C: No results for input(s): HGBA1C in the last 72 hours. CBG: No results for input(s): GLUCAP in the last 168 hours. Lipid Profile: No results for input(s): CHOL, HDL, LDLCALC, TRIG, CHOLHDL, LDLDIRECT in the last 72 hours. Thyroid Function Tests: No results for input(s): TSH, T4TOTAL, FREET4, T3FREE, THYROIDAB in the last 72 hours. Anemia Panel: No results for input(s): VITAMINB12, FOLATE, FERRITIN, TIBC, IRON, RETICCTPCT in the last 72 hours. Urine analysis:    Component Value Date/Time   COLORURINE STRAW (A) 10/30/2018 1707   APPEARANCEUR  CLEAR 10/30/2018 1707   LABSPEC 1.011 10/30/2018 1707   PHURINE 6.0 10/30/2018 1707   GLUCOSEU NEGATIVE 10/30/2018 1707   HGBUR SMALL (A) 10/30/2018 1707   BILIRUBINUR NEGATIVE 10/30/2018 1707   KETONESUR 80 (A) 10/30/2018 1707   PROTEINUR NEGATIVE 10/30/2018 1707   NITRITE NEGATIVE 10/30/2018 1707   LEUKOCYTESUR NEGATIVE 10/30/2018 1707   Sepsis Labs: _0 (procalcitonin:4,lacticidven:4) )No results found for this or any previous visit (from the past 240 hour(s)).       Radiology Studies: Ct Abdomen Pelvis W Contrast  Result Date: 10/30/2018 CLINICAL DATA:  Acute abdominal pain. History of bladder cancer. EXAM: CT ABDOMEN AND PELVIS WITH CONTRAST TECHNIQUE: Multidetector CT imaging of the abdomen and pelvis was performed using the standard protocol following bolus administration of intravenous contrast. CONTRAST:  165m ISOVUE-300 IOPAMIDOL (ISOVUE-300) INJECTION 61% COMPARISON:  09/03/2018 FINDINGS: Lower chest: Small but increasing bilateral pleural  effusions with basilar atelectasis. Greater on the left. Lung base nodules again demonstrated. Hepatobiliary: Multiple hypoenhancing liver lesions, likely metastatic. These are increased in conspicuity , size and number since previous study. Largest is in segment 6 and measures about 2.1 cm diameter. Layering density in the gallbladder likely sludge. No bile duct dilatation. Pancreas: Unremarkable. No pancreatic ductal dilatation or surrounding inflammatory changes. Spleen: Normal in size without focal abnormality. Adrenals/Urinary Tract: No adrenal gland nodules. Cyst on the left kidney. No hydronephrosis or hydroureter. Bladder is unremarkable. Stomach/Bowel: Stomach is within normal limits. Appendix is not identified. No evidence of bowel wall thickening, distention, or inflammatory changes. Vascular/Lymphatic: Aortic atherosclerosis. No enlarged abdominal or pelvic lymph nodes. Probable filling defect in the right external iliac and  femoral veins suggesting DVT. Ultrasound correlation suggested. Reproductive: Uterus and bilateral adnexa are unremarkable. Other: Small umbilical hernia containing fat. No free air or free fluid in the abdomen. Musculoskeletal: Multiple sclerotic bone lesions consistent with metastases. Superior endplate compression at L1. IMPRESSION: 1. Small but increasing bilateral pleural effusions with basilar atelectasis, greater on the left. Lung base nodules again demonstrated. Likely metastatic. 2. Multiple hypoenhancing liver lesions, increased in conspicuity since previous study, likely representing metastatic disease. 3. Multiple sclerotic bone metastases. 4. Filling defect in the right external iliac and femoral veins suggesting DVT. Aortic Atherosclerosis (ICD10-I70.0). These results were called by telephone at the time of interpretation on 10/30/2018 at 4:08 am to Dr. Ezequiel Essex , who verbally acknowledged these results. Electronically Signed   By: Lucienne Capers M.D.   On: 10/30/2018 04:10   Dg Abdomen Acute W/chest  Result Date: 10/30/2018 CLINICAL DATA:  Abdominal pain, nausea, and vomiting. History of bladder cancer. EXAM: DG ABDOMEN ACUTE W/ 1V CHEST COMPARISON:  09/28/2018 FINDINGS: A right jugular Port-A-Cath remains in place with tip over the mid SVC. The cardiomediastinal silhouette is unchanged with cardiac size upper limits of normal. Scarring or atelectasis is again noted in the left greater than right lower lungs. No sizable pleural effusion or pneumothorax is identified. No intraperitoneal free air is identified on the lateral decubitus radiograph. Scattered bowel gas is present throughout the abdomen. No dilated loops of bowel are seen to suggest obstruction. No acute osseous abnormality is identified. IMPRESSION: 1. Bibasilar scarring/atelectasis without evidence of acute cardiopulmonary disease. 2. Nonobstructed bowel gas pattern. Electronically Signed   By: Logan Bores M.D.   On:  10/30/2018 02:17      Scheduled Meds: . enoxaparin (LOVENOX) injection  60 mg Subcutaneous Q12H  . metoprolol tartrate  5 mg Intravenous Q6H  . ondansetron (ZOFRAN) IV  4 mg Intravenous Q6H   Or  . ondansetron  4 mg Oral Q6H  . pantoprazole (PROTONIX) IV  40 mg Intravenous Q12H  . potassium chloride  40 mEq Oral Once  . pregabalin  25 mg Oral BID  . Tbo-filgastrim (GRANIX) SQ  300 mcg Subcutaneous ONCE-1800   Continuous Infusions: . dextrose 5 % and 0.9 % NaCl with KCl 40 mEq/L 125 mL/hr at 10/31/18 1212  . famotidine (PEPCID) IV Stopped (10/31/18 1115)     LOS: 1 day    Time spent in minutes: 35    Debbe Odea, MD [facility] Pager: [email or web address] Password [facility] 10/31/2018, 4:48 PM

## 2018-10-31 NOTE — Consult Note (Signed)
Patient now DNR. Symptom Management Regimen: -restart duragesic -IV fentanyl PRN until TD effective -scheduled reglan -decadron  Suspect she is having capsular pain and possibly epigastric pain from her ulcer.Will work on a better pain and nausea regimen for her-she is very stoic and under reporting her level of suffering in general.   Full consult note to follow. Lane Hacker, DO Palliative Medicine

## 2018-10-31 NOTE — Progress Notes (Signed)
Initial Nutrition Assessment  DOCUMENTATION CODES:   Non-severe (moderate) malnutrition in context of chronic illness  INTERVENTION:   Encourage patient to request protein supplements from unit as desired  NUTRITION DIAGNOSIS:   Moderate Malnutrition related to cancer and cancer related treatments, chronic illness as evidenced by percent weight loss, energy intake < or equal to 75% for > or equal to 1 month.  GOAL:   Patient will meet greater than or equal to 90% of their needs  MONITOR:   PO intake, Supplement acceptance, Labs, Weight trends, I & O's  REASON FOR ASSESSMENT:   Malnutrition Screening Tool    ASSESSMENT:   67 y.o. female with medical history significant of metastasized bladder cancer (s/p of surgery, radiation, last chemo on 10/25), hypertension, hyperlipidemia, GERD, anxiety, anemia, who presents with intractable nausea and vomiting for several days an burning circumferential upper abd and mid back pain.  Patient sitting up in chair during visit. Pt reports feeling sleepy as she just received some nausea medication. Pt has had N/V for ~2 months now and has been unable to keep any food or liquids down. Pt had a few bites of pudding and sips of grape juice this morning. Hopeful that this will stay down. Not sure if protein supplements will be tolerated at this time but encouraged patient to request them if she is able to control the nausea.  Palliative care consulted to see patient for [place]. Will monitor for decisions.  Per weight records, pt has lost 16 lb since 10/3 (10% wt loss x 1 month, significant for time frame).  Labs reviewed. Medications: Zofran every 6 hours, D5 and .9% NaCl w/ KCl infusion at 125 ml/hr (510 kcal)  NUTRITION - FOCUSED PHYSICAL EXAM:  Nutrition focused physical exam shows no sign of depletion of muscle mass or body fat.  Diet Order:   Diet Order            Diet full liquid Room service appropriate? Yes; Fluid consistency: Thin  Diet  effective now              EDUCATION NEEDS:   Education needs have been addressed  Skin:  Skin Assessment: Reviewed RN Assessment  Last BM:  11/4  Height:   Ht Readings from Last 1 Encounters:  10/30/18 5\' 1"  (1.549 m)    Weight:   Wt Readings from Last 1 Encounters:  10/30/18 64.4 kg    Ideal Body Weight:  47.7 kg  BMI:  Body mass index is 26.83 kg/m.  Estimated Nutritional Needs:   Kcal:  1600-1800  Protein:  75-85g  Fluid:  1.8L/day  Clayton Bibles, MS, RD, LDN [facility] Pager: [phone] After Hours Pager: [phone]

## 2018-10-31 NOTE — Progress Notes (Signed)
PA for fentanyl patches has been submitted. 

## 2018-10-31 NOTE — Progress Notes (Signed)
*  Preliminary Results* Bilateral lower extremity venous duplex completed.  Right: Findings consistent with acute deep vein thrombosis involving the right common femoral vein, right femoral vein, and right popliteal vein. No cystic structure found in the popliteal fossa.  Left: There is no evidence of deep vein thrombosis in the lower extremity. No cystic structure found in the popliteal fossa.   Abram Sander 10/31/2018 10:07 AM

## 2018-11-01 LAB — CBC WITH DIFFERENTIAL/PLATELET
Abs Immature Granulocytes: 0.12 10*3/uL — ABNORMAL HIGH (ref 0.00–0.07)
BASOS ABS: 0 10*3/uL (ref 0.0–0.1)
BASOS PCT: 0 %
Eosinophils Absolute: 0 10*3/uL (ref 0.0–0.5)
Eosinophils Relative: 1 %
HCT: 26.2 % — ABNORMAL LOW (ref 36.0–46.0)
Hemoglobin: 8.1 g/dL — ABNORMAL LOW (ref 12.0–15.0)
Immature Granulocytes: 6 %
LYMPHS PCT: 13 %
Lymphs Abs: 0.3 10*3/uL — ABNORMAL LOW (ref 0.7–4.0)
MCH: 25.6 pg — ABNORMAL LOW (ref 26.0–34.0)
MCHC: 30.9 g/dL (ref 30.0–36.0)
MCV: 82.9 fL (ref 80.0–100.0)
Monocytes Absolute: 0.3 10*3/uL (ref 0.1–1.0)
Monocytes Relative: 16 %
NEUTROS ABS: 1.3 10*3/uL — AB (ref 1.7–7.7)
NEUTROS PCT: 64 %
NRBC: 0 % (ref 0.0–0.2)
PLATELETS: 147 10*3/uL — AB (ref 150–400)
RBC: 3.16 MIL/uL — ABNORMAL LOW (ref 3.87–5.11)
RDW: 15.1 % (ref 11.5–15.5)
WBC: 2 10*3/uL — ABNORMAL LOW (ref 4.0–10.5)

## 2018-11-01 LAB — BASIC METABOLIC PANEL
ANION GAP: 6 (ref 5–15)
BUN: <5 mg/dL — ABNORMAL LOW (ref 8–23)
CO2: 27 mmol/L (ref 22–32)
Calcium: 9.2 mg/dL (ref 8.9–10.3)
Chloride: 106 mmol/L (ref 98–111)
Creatinine, Ser: 0.68 mg/dL (ref 0.44–1.00)
Glucose, Bld: 155 mg/dL — ABNORMAL HIGH (ref 70–99)
POTASSIUM: 5 mmol/L (ref 3.5–5.1)
SODIUM: 139 mmol/L (ref 135–145)

## 2018-11-01 MED ORDER — LORAZEPAM 1 MG PO TABS: 1.0000 mg | ORAL_TABLET | Freq: Once | ORAL | Status: DC

## 2018-11-01 MED ORDER — RIVAROXABAN 20 MG PO TABS: 20.0000 mg | ORAL_TABLET | Freq: Every day | ORAL | Status: DC

## 2018-11-01 MED ORDER — SODIUM CHLORIDE 0.9 % IV SOLN
INTRAVENOUS | Status: DC | PRN
  Administered 2018-11-01 – 2018-11-02 (×2): 250 mL via INTRAVENOUS

## 2018-11-01 MED ORDER — RIVAROXABAN 15 MG PO TABS
15.0000 mg | ORAL_TABLET | Freq: Two times a day (BID) | ORAL | Status: DC
  Administered 2018-11-01 – 2018-11-05 (×8): 15 mg via ORAL
  Filled 2018-11-01 (×11): qty 1

## 2018-11-01 MED ORDER — LOPERAMIDE HCL 2 MG PO CAPS: 4.0000 mg | ORAL_CAPSULE | Freq: Once | ORAL | Status: DC

## 2018-11-01 MED ORDER — LIDOCAINE 5 % EX PTCH
2.0000 | MEDICATED_PATCH | CUTANEOUS | Status: DC
  Administered 2018-11-01 – 2018-11-05 (×5): 2 via TRANSDERMAL
  Filled 2018-11-01 (×5): qty 2

## 2018-11-01 MED ORDER — LOPERAMIDE HCL 2 MG PO CAPS: 2.0000 mg | ORAL_CAPSULE | Freq: Once | ORAL | Status: DC

## 2018-11-01 MED ORDER — MORPHINE SULFATE (PF) 4 MG/ML IV SOLN
4.0000 mg | INTRAVENOUS | Status: DC | PRN
  Administered 2018-11-01 – 2018-11-04 (×4): 4 mg via INTRAVENOUS
  Filled 2018-11-01 (×4): qty 1

## 2018-11-01 NOTE — Progress Notes (Addendum)
Family Meeting/Goals of Care  Met with patient and her brother and sister in law who are her primary supports here locally. She also has a 66 yo son that lives with her but works during the day-he is having a hard time with her illness and prognosis. Faith [name] has full capacity for decision making.  Summary:  Widely metastatic bladder cancer, complete destruction of T7 vertabra and possible compression syndrom affecting her ability to ambulate and move safely. Has severe pain with any twisting of her torso, burning band like pain around her diaphragm.   1. We discussed her diagnosis, prognosis and possible illness trajectory. 2. No Additional chemotherapy, goals now are symptom management and QOL. 3. She is agreeable to Faith [name], she wants to go home even if for a short period of time a few days -week to get her affairs in order, she needs to work out family caregiving presence and support to do this along with hospice care. She does not if possible want to die at home with her son there alone and is interested in care at a hospice facility-she has very difficult to manage pain and nausea vomiting with PO intake and may benefit from this level of care. She is thinking about the options.  4. I think she needs another day or two inpatient to get her pain under control, assess her safety/ambulatory/mobility needs, to see if she is able to take PO medication without vomiting and take in any amount nutrition which she is not currently doing. If she is not eating I think direct hospice facility care may be best for her, but if she is able to mobilize with one person assist or independently and is taking PO medication, home with hospice would be a good plan. She is considering options-I will ask a hospice liaison to see her and discuss their services in more detail. She lives near [facility] and is requesting them to be provider. 4. Will have inpatient PT eval her for palliative recommendations for mobility and  safety given T-Spine cancer involvement- is there a way to stabilize her torso from rotation without restricting her breathing or causing discomfort?  Plan: 1-2 more days to see if we can transition her to PO and get pain under control- there may also be interventional options.Continue to treat reversible illness and goals are QOL.  Lane Hacker, DO Palliative Medicine [phone]  Time: 35 min Greater than 50%  of this time was spent counseling and coordinating care related to the above assessment and plan.

## 2018-11-01 NOTE — Discharge Instructions (Signed)
Information on my medicine - XARELTO (rivaroxaban)  This medication education was reviewed with me or my healthcare representative as part of my discharge preparation.  The pharmacist that spoke with me during my hospital stay was:  Minda Ditto, [place]? Xarelto was prescribed to treat blood clots that may have been found in the veins of your legs (deep vein thrombosis) or in your lungs (pulmonary embolism) and to reduce the risk of them occurring again.  What do you need to know about Xarelto? The starting dose is one 15 mg tablet taken TWICE daily with food for the FIRST 21 DAYS then on (enter date)  11/28  the dose is changed to one 20 mg tablet taken ONCE A DAY with your evening meal.  DO NOT stop taking Xarelto without talking to the health care provider who prescribed the medication.  Refill your prescription for 20 mg tablets before you run out.  After discharge, you should have regular check-up appointments with your healthcare provider that is prescribing your Xarelto.  In the future your dose may need to be changed if your kidney function changes by a significant amount.  What do you do if you miss a dose? If you are taking Xarelto TWICE DAILY and you miss a dose, take it as soon as you remember. You may take two 15 mg tablets (total 30 mg) at the same time then resume your regularly scheduled 15 mg twice daily the next day.  If you are taking Xarelto ONCE DAILY and you miss a dose, take it as soon as you remember on the same day then continue your regularly scheduled once daily regimen the next day. Do not take two doses of Xarelto at the same time.   Important Safety Information Xarelto is a blood thinner medicine that can cause bleeding. You should call your healthcare provider right away if you experience any of the following: ? Bleeding from an injury or your nose that does not stop. ? Unusual colored urine (red or dark brown) or  unusual colored stools (red or black). ? Unusual bruising for unknown reasons. ? A serious fall or if you hit your head (even if there is no bleeding).  Some medicines may interact with Xarelto and might increase your risk of bleeding while on Xarelto. To help avoid this, consult your healthcare provider or pharmacist prior to using any new prescription or non-prescription medications, including herbals, vitamins, non-steroidal anti-inflammatory drugs (NSAIDs) and supplements.  This website has more information on Xarelto: [email or web address]

## 2018-11-01 NOTE — Progress Notes (Signed)
PROGRESS NOTE    Faith [name]  BRA:[id number] DOB: 1951/01/24 DOA: 10/30/2018 PCP: Fanny Bien, MD   Brief Narrative:67 y.o.femalewith medical history significant ofmetastasized bladder cancer(s/p ofsurgery, radiation, last chemoon10/25), hypertension, hyperlipidemia, GERD, anxiety, anemia, who presents with intractable nauseaandvomiting for several days an burning circumferential upper abd and mid back pain.  Patient was recently hospitalized from 10/2-10/7 because of intractable nauseaandvomiting for almost 2 months. Patient hadEGD, which showed nonbleeding gastric ulcer with no stigmata of bleeding. Abdominal ultrasound showed gallbladder sludge without cholelithiasis or acute cholecystitis.    PPIwasincreased to BID  In ED> WBC 2.8, platelet 121, hemoglobin 9.2 CT abdomen/pelvis showed metastasized liver disease, and possible right external iliac and femoral vein DVT.   Assessment & Plan:   Principal Problem:   Intractable nausea and vomiting Active Problems:   HTN (hypertension)   Urothelial carcinoma of bladder (HCC)   Hyperlipidemia   PUD (peptic ulcer disease)   Pancytopenia (HCC)   Anxiety   DVT (deep venous thrombosis) (HCC)   Intractable vomiting with nausea   Malnutrition of moderate degree  Intractable nausea and vomiting/ PUD (peptic ulcer disease) - BID Protonix- cannot tolerate sucralfate - Zofran QID - PRN phenergan - advance diet and see if she can tolerate    Active Problems:    Pancytopenia   - give Granix 11/5  and WBC 2.0 - Hb 8.0- follow - Plt 147   Hypokalemia - replaced- k up to 5.0.dc kcl    DVT (deep venous thrombosis)  - CT as above - venous duplex> acute deep vein thrombosis involving the right common femoral vein, right femoral vein, and right popliteal vein. - Heparin- unable to take orals    HTN (hypertension) - IV Lopressor routine and PRN Hydralazine- Norvasc and Benicar on hold    Urothelial  carcinoma of bladder with pain - Dr Alen Blew has recommended palliative care discussion- life expectancy < 6 mo - discussed with patient in detail today - palliative consult recommended - IV Morphine ordered - will add Lyrica today for the burning pain which may be related to spinal met- can increase as tolerated - palliative care added Fentanyl IV and patch- Oxycodone stopped    Hyperlipidemia - hold statin    Anxiety - PRN IV Lorazepam, Zyprexa added at bedtime   Nutrition Problem: Moderate Malnutrition Etiology: cancer and cancer related treatments, chronic illness   Malnutrition Characteristics:  Signs/Symptoms: percent weight loss, energy intake < or equal to 75% for > or equal to 1 month Percent weight loss: 10 %(x 1 month)   Nutrition Interventions:  Interventions: Refer to RD note for recommendations  Estimated body mass index is 26.83 kg/m as calculated from the following:   Height as of this encounter: 5' 1"  (1.549 m).   Weight as of this encounter: 64.4 kg.  DVT prophylaxis: Heparin Code Status: Full code Family Communication none Disposition Plan: Pending palliative discussion meanwhile I will get PT evaluation and ambulate the patient.  Consultants: Oncology  Procedures:venous doppler Antimicrobials: none Subjective:sitting up in chair in nad had big bm yesterday no nausea vomitting   Objective: Vitals:   10/31/18 2043 11/01/18 0536 11/01/18 0830 11/01/18 0833  BP: (!) 170/84 (!) 152/82 (!) 165/91 (!) 168/90  Pulse: 73 66 64   Resp: 18 16 14    Temp: 98 F (36.7 C) (!) 97.5 F (36.4 C) 99.1 F (37.3 C)   TempSrc: Oral Oral Oral   SpO2: 98% 100% 99%   Weight:  Height:        Intake/Output Summary (Last 24 hours) at 11/01/2018 1134 Last data filed at 11/01/2018 1108 Gross per 24 hour  Intake 3524.66 ml  Output 1000 ml  Net 2524.66 ml   Filed Weights   10/30/18 1444  Weight: 64.4 kg    Examination:  General exam: Appears calm and  comfortable  Respiratory system: Clear to auscultation. Respiratory effort normal. Cardiovascular system: S1 & S2 heard, RRR. No JVD, murmurs, rubs, gallops or clicks. No pedal edema. Gastrointestinal system: Abdomen is nondistended, soft and nontender. No organomegaly or masses felt. Normal bowel sounds heard. Central nervous system: Alert and oriented. No focal neurological deficits. Extremities: Symmetric 5 x 5 power. Skin: No rashes, lesions or ulcers Psychiatry: Judgement and insight appear normal. Mood & affect appropriate.     Data Reviewed: I have personally reviewed following labs and imaging studies  CBC: Recent Labs  Lab 10/30/18 0201 10/31/18 0553 11/01/18 0613  WBC 2.8* 1.1* 2.0*  NEUTROABS  --  0.7* 1.3*  HGB 9.2* 8.0* 8.1*  HCT 28.8* 25.6* 26.2*  MCV 80.7 81.5 82.9  PLT 121* 120* 109*   Basic Metabolic Panel: Recent Labs  Lab 10/30/18 0201 10/30/18 0205 10/31/18 0553 11/01/18 0613  NA 138  --  140 139  K 2.8*  --  4.0 5.0  CL 98  --  108 106  CO2 24  --  27 27  GLUCOSE 93  --  150* 155*  BUN 7*  --  <5* <5*  CREATININE 0.58  --  0.48 0.68  CALCIUM 9.3  --  8.8* 9.2  MG  --  1.7 1.8  --    GFR: Estimated Creatinine Clearance: 59.4 mL/min (by C-G formula based on SCr of 0.68 mg/dL). Liver Function Tests: Recent Labs  Lab 10/30/18 0201  AST 23  ALT 14  ALKPHOS 85  BILITOT 1.0  PROT 7.0  ALBUMIN 3.1*   Recent Labs  Lab 10/30/18 0201  LIPASE 12   No results for input(s): AMMONIA in the last 168 hours. Coagulation Profile: Recent Labs  Lab 10/30/18 0538  INR 1.20   Cardiac Enzymes: No results for input(s): CKTOTAL, CKMB, CKMBINDEX, TROPONINI in the last 168 hours. BNP (last 3 results) No results for input(s): PROBNP in the last 8760 hours. HbA1C: No results for input(s): HGBA1C in the last 72 hours. CBG: No results for input(s): GLUCAP in the last 168 hours. Lipid Profile: No results for input(s): CHOL, HDL, LDLCALC, TRIG, CHOLHDL,  LDLDIRECT in the last 72 hours. Thyroid Function Tests: No results for input(s): TSH, T4TOTAL, FREET4, T3FREE, THYROIDAB in the last 72 hours. Anemia Panel: No results for input(s): VITAMINB12, FOLATE, FERRITIN, TIBC, IRON, RETICCTPCT in the last 72 hours. Sepsis Labs: Recent Labs  Lab 10/30/18 0211  LATICACIDVEN 1.20    No results found for this or any previous visit (from the past 240 hour(s)).       Radiology Studies: Vas [place] Lower Extremity Venous (dvt)  Result Date: 10/31/2018  Lower Venous Study Indications: Hx of dvt.  Performing Technologist: Abram Sander  Examination Guidelines: A complete evaluation includes B-mode imaging, spectral Doppler, color Doppler, and power Doppler as needed of all accessible portions of each vessel. Bilateral testing is considered an integral part of a complete examination. Limited examinations for reoccurring indications may be performed as noted.  Right Venous Findings: +---------+---------------+---------+-----------+----------+-------+          CompressibilityPhasicitySpontaneityPropertiesSummary +---------+---------------+---------+-----------+----------+-------+ CFV      None  No       No                   Acute   +---------+---------------+---------+-----------+----------+-------+ SFJ      None                                         Acute   +---------+---------------+---------+-----------+----------+-------+ FV Prox  None                                         Acute   +---------+---------------+---------+-----------+----------+-------+ FV Mid   Full                                                 +---------+---------------+---------+-----------+----------+-------+ FV DistalFull                                                 +---------+---------------+---------+-----------+----------+-------+ PFV      Full                                                  +---------+---------------+---------+-----------+----------+-------+ POP      None           Yes      Yes                  Acute   +---------+---------------+---------+-----------+----------+-------+ PTV      Full                                                 +---------+---------------+---------+-----------+----------+-------+ PERO     Full                                                 +---------+---------------+---------+-----------+----------+-------+  Left Venous Findings: +---------+---------------+---------+-----------+----------+-------+          CompressibilityPhasicitySpontaneityPropertiesSummary +---------+---------------+---------+-----------+----------+-------+ CFV      Full           Yes      Yes                          +---------+---------------+---------+-----------+----------+-------+ SFJ      Full                                                 +---------+---------------+---------+-----------+----------+-------+ FV Prox  Full                                                 +---------+---------------+---------+-----------+----------+-------+  FV Mid   Full                                                 +---------+---------------+---------+-----------+----------+-------+ FV DistalFull                                                 +---------+---------------+---------+-----------+----------+-------+ PFV      Full                                                 +---------+---------------+---------+-----------+----------+-------+ POP      Full           Yes      Yes                          +---------+---------------+---------+-----------+----------+-------+ PTV      Full                                                 +---------+---------------+---------+-----------+----------+-------+ PERO     Full                                                  +---------+---------------+---------+-----------+----------+-------+    Summary: Right: Findings consistent with acute deep vein thrombosis involving the right common femoral vein, right femoral vein, and right popliteal vein. No cystic structure found in the popliteal fossa. Left: There is no evidence of deep vein thrombosis in the lower extremity. No cystic structure found in the popliteal fossa.  *See table(s) above for measurements and observations. Electronically signed by Harold Barban MD on 10/31/2018 at 6:55:21 PM.    Final         Scheduled Meds: . dexamethasone  2 mg Intravenous Q12H  . enoxaparin (LOVENOX) injection  60 mg Subcutaneous Q12H  . fentaNYL  25 mcg Transdermal Q72H  . metoCLOPramide (REGLAN) injection  5 mg Intravenous TID  . metoprolol tartrate  5 mg Intravenous Q6H  . OLANZapine zydis  5 mg Oral QHS  . ondansetron (ZOFRAN) IV  4 mg Intravenous Q6H   Or  . ondansetron  4 mg Oral Q6H  . pantoprazole (PROTONIX) IV  40 mg Intravenous Q12H  . potassium chloride  40 mEq Oral Once  . pregabalin  25 mg Oral BID   Continuous Infusions: . dextrose 5 % and 0.9 % NaCl with KCl 40 mEq/L 125 mL/hr at 11/01/18 1108  . famotidine (PEPCID) IV Stopped (11/01/18 1053)     LOS: 2 days     Georgette Shell, MD [facility]  7PM-7AM, please contact night-coverage [email or web address] Password [email or web address] 11/01/2018, 11:34 AM

## 2018-11-01 NOTE — Care Management (Signed)
Per benefits check:  Xarelto 30 day supply $37 copay Eliquis 30 day supply $37 copay.  Marney Doctor RN,BSN [phone]

## 2018-11-02 LAB — CBC WITH DIFFERENTIAL/PLATELET
Abs Immature Granulocytes: 0.48 10*3/uL — ABNORMAL HIGH (ref 0.00–0.07)
BASOS ABS: 0 10*3/uL (ref 0.0–0.1)
BASOS PCT: 0 %
EOS ABS: 0 10*3/uL (ref 0.0–0.5)
EOS PCT: 0 %
HEMATOCRIT: 26.9 % — AB (ref 36.0–46.0)
HEMOGLOBIN: 8.4 g/dL — AB (ref 12.0–15.0)
Immature Granulocytes: 20 %
LYMPHS PCT: 12 %
Lymphs Abs: 0.3 10*3/uL — ABNORMAL LOW (ref 0.7–4.0)
MCH: 26.3 pg (ref 26.0–34.0)
MCHC: 31.2 g/dL (ref 30.0–36.0)
MCV: 84.1 fL (ref 80.0–100.0)
MONO ABS: 0.3 10*3/uL (ref 0.1–1.0)
Monocytes Relative: 14 %
NEUTROS PCT: 54 %
NRBC: 0.8 % — AB (ref 0.0–0.2)
Neutro Abs: 1.3 10*3/uL — ABNORMAL LOW (ref 1.7–7.7)
Platelets: 153 10*3/uL (ref 150–400)
RBC: 3.2 MIL/uL — AB (ref 3.87–5.11)
RDW: 15.7 % — ABNORMAL HIGH (ref 11.5–15.5)
WBC: 2.4 10*3/uL — ABNORMAL LOW (ref 4.0–10.5)

## 2018-11-02 LAB — BASIC METABOLIC PANEL
Anion gap: 6 (ref 5–15)
BUN: <5 mg/dL — ABNORMAL LOW (ref 8–23)
CALCIUM: 9.6 mg/dL (ref 8.9–10.3)
CHLORIDE: 103 mmol/L (ref 98–111)
CO2: 27 mmol/L (ref 22–32)
Creatinine, Ser: 0.78 mg/dL (ref 0.44–1.00)
GFR calc Af Amer: >60 mL/min (ref >60)
GFR calc non Af Amer: >60 mL/min (ref >60)
GLUCOSE: 115 mg/dL — AB (ref 70–99)
Potassium: 4.9 mmol/L (ref 3.5–5.1)
Sodium: 136 mmol/L (ref 135–145)

## 2018-11-02 MED ORDER — BISACODYL 5 MG PO TBEC
10.0000 mg | DELAYED_RELEASE_TABLET | Freq: Every day | ORAL | Status: DC
  Administered 2018-11-02 – 2018-11-05 (×4): 10 mg via ORAL
  Filled 2018-11-02 (×4): qty 2

## 2018-11-02 MED ORDER — AMLODIPINE BESYLATE 10 MG PO TABS
10.0000 mg | ORAL_TABLET | Freq: Every morning | ORAL | Status: DC
  Administered 2018-11-02 – 2018-11-05 (×4): 10 mg via ORAL
  Filled 2018-11-02 (×4): qty 1

## 2018-11-02 NOTE — Evaluation (Signed)
Physical Therapy Evaluation Patient Details Name: Faith [name] MRN: [id number] DOB: 04-13-1951 Today's Date: 11/02/2018   History of Present Illness  Pt admitted through ED with N/V.  Pt with hx of bladder CA with spinal mets  Clinical Impression  Pt admitted as above and presenting with functional mobility limitations 2* pain, bil LE weakness, trunk instability and balance deficits.  On this evaluation pt is requiring significant assist of 2 to perform basic mobility tasks.  Pt was hopeful for transition home with hospice but may need to consider alternative plans dependent on family ability to provide level of assist currently needed.    Follow Up Recommendations No PT follow up;Other (comment)([facility])    Equipment Recommendations  3in1 (PT)    Recommendations for Other Services       Precautions / Restrictions Precautions Precautions: Fall Restrictions Weight Bearing Restrictions: No      Mobility  Bed Mobility Overal bed mobility: Needs Assistance Bed Mobility: Supine to Sit;Sit to Supine;Rolling;Sidelying to Sit Rolling: Min assist;Mod assist Sidelying to sit: Min assist;Mod assist Supine to sit: Min assist;Mod assist Sit to supine: Min assist;Mod assist   General bed mobility comments: Increased time with cues for log roll technique  Transfers Overall transfer level: Needs assistance Equipment used: Rolling walker (2 wheeled) Transfers: Sit to/from Stand Sit to Stand: Mod assist;+2 physical assistance;+2 safety/equipment         General transfer comment: cues for transition position and use of UEs to self assist.  Physical assist to bring weight up and fwd and to balance in standing  Ambulation/Gait             General Gait Details: Pt stood x 2 only.  Pt unable to step 2* LE weakness and tolerated standing  less than 1 minute each attempt 2* fatigue and pain  Stairs            Wheelchair Mobility    Modified Rankin (Stroke  Patients Only)       Balance Overall balance assessment: Needs assistance Sitting-balance support: Feet supported;No upper extremity supported Sitting balance-Leahy Scale: Fair     Standing balance support: Bilateral upper extremity supported Standing balance-Leahy Scale: Poor                               Pertinent Vitals/Pain Pain Assessment: Faces Faces Pain Scale: Hurts whole lot Pain Location: abdomen and around back Pain Descriptors / Indicators: Grimacing;Guarding Pain Intervention(s): Patient requesting pain meds-RN notified;Limited activity within patient's tolerance;Monitored during session;Premedicated before session    Home Living Family/patient expects to be discharged to:: Private residence Living Arrangements: Children Available Help at Discharge: Family Type of Home: House Home Access: Stairs to enter Entrance Stairs-Rails: None Entrance Stairs-Number of Steps: 2 Home Layout: One level Home Equipment: Environmental consultant - 2 wheels;Walker - 4 wheels;Shower seat      Prior Function Level of Independence: Independent               Hand Dominance        Extremity/Trunk Assessment   Upper Extremity Assessment Upper Extremity Assessment: Overall WFL for tasks assessed    Lower Extremity Assessment Lower Extremity Assessment: Generalized weakness       Communication   Communication: No difficulties  Cognition Arousal/Alertness: Awake/alert Behavior During Therapy: WFL for tasks assessed/performed Overall Cognitive Status: Within Functional Limits for tasks assessed  General Comments      Exercises     Assessment/Plan    PT Assessment Patient needs continued PT services  PT Problem List Decreased strength;Decreased activity tolerance;Decreased balance;Decreased mobility;Decreased knowledge of use of DME;Pain       PT Treatment Interventions DME instruction;Gait  training;Functional mobility training;Therapeutic activities;Balance training;Patient/family education    PT Goals (Current goals can be found in the Care Plan section)  Acute Rehab PT Goals Patient Stated Goal: HOme PT Goal Formulation: With patient Time For Goal Achievement: 11/16/18 Potential to Achieve Goals: Poor    Frequency Min 2X/week   Barriers to discharge        Co-evaluation               AM-PAC PT "6 Clicks" Daily Activity  Outcome Measure Difficulty turning over in bed (including adjusting bedclothes, sheets and blankets)?: Unable Difficulty moving from lying on back to sitting on the side of the bed? : Unable Difficulty sitting down on and standing up from a chair with arms (e.g., wheelchair, bedside commode, etc,.)?: Unable Help needed moving to and from a bed to chair (including a wheelchair)?: A Lot Help needed walking in hospital room?: Total Help needed climbing 3-5 steps with a railing? : Total 6 Click Score: 7    End of Session   Activity Tolerance: Patient limited by fatigue;Patient limited by pain Patient left: in bed;with call bell/phone within reach;with bed alarm set Nurse Communication: Mobility status PT Visit Diagnosis: Difficulty in walking, not elsewhere classified (R26.2);Muscle weakness (generalized) (M62.81);Pain    Time: 1530-1605 PT Time Calculation (min) (ACUTE ONLY): 35 min   Charges:   PT Evaluation $PT Eval Low Complexity: 1 Low PT Treatments $Therapeutic Activity: 8-22 mins        Debe Coder PT Acute Rehabilitation Services Pager [phone] Office [phone]   Rosalie Buenaventura 11/02/2018, 4:42 PM

## 2018-11-02 NOTE — Care Management Important Message (Signed)
Important Message  Patient Details  Name: DAYLYNN STUMPP MRN: [id number] Date of Birth: 1951/07/15   Medicare Important Message Given:  Yes    Kerin Salen 11/02/2018, 10:21 AMImportant Message  Patient Details  Name: SHAUNTE TUFT MRN: [id number] Date of Birth: [birth year]   Medicare Important Message Given:  Yes    Kerin Salen 11/02/2018, 10:20 AM

## 2018-11-02 NOTE — Progress Notes (Signed)
PROGRESS NOTE    Faith [name]  GYI:[id number] DOB: 1951/05/22 DOA: 10/30/2018 PCP: Fanny Bien, MD   Brief Narrative: 67 y.o.femalewith medical history significant ofmetastasized bladder cancer(s/p ofsurgery, radiation, last chemoon10/25), hypertension, hyperlipidemia, GERD, anxiety, anemia, who presents with intractable nauseaandvomiting for several days an burning circumferential upper abd and mid back pain.  Patient was recently hospitalized from 10/2-10/7 because of intractable nauseaandvomiting for almost 2 months. Patient hadEGD, which showed nonbleeding gastric ulcer with no stigmata of bleeding. Abdominal ultrasound showed gallbladder sludge without cholelithiasis or acute cholecystitis. PPIwasincreased to BID  In ED> WBC 2.8, platelet 121, hemoglobin 9.2 CT abdomen/pelvis showed metastasized liver disease, and possible right external iliac and femoral vein DVT.    Assessment & Plan:   Principal Problem:   Intractable nausea and vomiting Active Problems:   HTN (hypertension)   Urothelial carcinoma of bladder (HCC)   Hyperlipidemia   PUD (peptic ulcer disease)   Pancytopenia (HCC)   Anxiety   DVT (deep venous thrombosis) (HCC)   Intractable vomiting with nausea   Malnutrition of moderate degree  #1 metastatic bladder cancer with mets to T7 vertebrae and possible compression syndrome she reports she has increased pain only when she moves.  However the pain is getting better on current treatment.  Dr. Alen Blew recommended palliative care discussion since her life expectancy was less than 6 months.  Patient started on Decadron 2 mg every 12, fentanyl 25 MCG, morphine and Lyrica.  PT eval pending.  #2 intractable nausea and vomiting resolving patient it was able to tolerate p.o. Intake.  #3 DVT right lower extremity started oN Xarelto yesterday.  #4 hypertension remains elevated restart home medications.  #5 constipation stool softeners to be  started today.  Malnutrition Type:  Nutrition Problem: Moderate Malnutrition Etiology: cancer and cancer related treatments, chronic illness   Malnutrition Characteristics:  Signs/Symptoms: percent weight loss, energy intake < or equal to 75% for > or equal to 1 month Percent weight loss: 10 %(x 1 month)   Nutrition Interventions:  Interventions: Refer to RD note for recommendations  Estimated body mass index is 26.83 kg/m as calculated from the following:   Height as of this encounter: 5\' 1"  (1.549 m).   Weight as of this encounter: 64.4 kg.  DVT prophylaxis: Xarelto Code Status DO not resuscitate Family Communication  none Disposition Plan: Pending clinical improvement hopefully discharge 11/03/2018 Consultants:  Palliative care  Procedures: None Antimicrobials: None  Subjective: Patient resting in bed she reports that she was able to tolerate what she ate yesterday without any nausea or vomiting.  She has not had a bowel movement since Monday.   Objective: Vitals:   11/01/18 2156 11/02/18 0125 11/02/18 0557 11/02/18 1118  BP: (!) 158/89 (!) 156/82 (!) 154/92   Pulse: 65 73 77   Resp:  17 16   Temp: 98.7 F (37.1 C) 97.6 F (36.4 C) 97.7 F (36.5 C)   TempSrc: Oral Oral Oral   SpO2: 99% 92% 93% 91%  Weight:      Height:        Intake/Output Summary (Last 24 hours) at 11/02/2018 1141 Last data filed at 11/02/2018 0600 Gross per 24 hour  Intake 185.29 ml  Output 1000 ml  Net -814.71 ml   Filed Weights   10/30/18 1444  Weight: 64.4 kg    Examination:  General exam: Appears calm and comfortable  Respiratory system: Clear to auscultation. Respiratory effort normal. Cardiovascular system: S1 & S2 heard, RRR. No JVD, murmurs,  rubs, gallops or clicks. No pedal edema. Gastrointestinal system: Abdomen is nondistended, soft and nontender. No organomegaly or masses felt. Normal bowel sounds heard. Central nervous system: Alert and oriented. No focal  neurological deficits. Extremities: Symmetric 5 x 5 power. Skin: No rashes, lesions or ulcers Psychiatry: Judgement and insight appear normal. Mood & affect appropriate.     Data Reviewed: I have personally reviewed following labs and imaging studies  CBC: Recent Labs  Lab 10/30/18 0201 10/31/18 0553 11/01/18 0613 11/02/18 0617  WBC 2.8* 1.1* 2.0* 2.4*  NEUTROABS  --  0.7* 1.3* 1.3*  HGB 9.2* 8.0* 8.1* 8.4*  HCT 28.8* 25.6* 26.2* 26.9*  MCV 80.7 81.5 82.9 84.1  PLT 121* 120* 147* 195   Basic Metabolic Panel: Recent Labs  Lab 10/30/18 0201 10/30/18 0205 10/31/18 0553 11/01/18 0613 11/02/18 0617  NA 138  --  140 139 136  K 2.8*  --  4.0 5.0 4.9  CL 98  --  108 106 103  CO2 24  --  27 27 27   GLUCOSE 93  --  150* 155* 115*  BUN 7*  --  <5* <5* <5*  CREATININE 0.58  --  0.48 0.68 0.78  CALCIUM 9.3  --  8.8* 9.2 9.6  MG  --  1.7 1.8  --   --    GFR: Estimated Creatinine Clearance: 59.4 mL/min (by C-G formula based on SCr of 0.78 mg/dL). Liver Function Tests: Recent Labs  Lab 10/30/18 0201  AST 23  ALT 14  ALKPHOS 85  BILITOT 1.0  PROT 7.0  ALBUMIN 3.1*   Recent Labs  Lab 10/30/18 0201  LIPASE 12   No results for input(s): AMMONIA in the last 168 hours. Coagulation Profile: Recent Labs  Lab 10/30/18 0538  INR 1.20   Cardiac Enzymes: No results for input(s): CKTOTAL, CKMB, CKMBINDEX, TROPONINI in the last 168 hours. BNP (last 3 results) No results for input(s): PROBNP in the last 8760 hours. HbA1C: No results for input(s): HGBA1C in the last 72 hours. CBG: No results for input(s): GLUCAP in the last 168 hours. Lipid Profile: No results for input(s): CHOL, HDL, LDLCALC, TRIG, CHOLHDL, LDLDIRECT in the last 72 hours. Thyroid Function Tests: No results for input(s): TSH, T4TOTAL, FREET4, T3FREE, THYROIDAB in the last 72 hours. Anemia Panel: No results for input(s): VITAMINB12, FOLATE, FERRITIN, TIBC, IRON, RETICCTPCT in the last 72 hours. Sepsis  Labs: Recent Labs  Lab 10/30/18 0211  LATICACIDVEN 1.20    No results found for this or any previous visit (from the past 240 hour(s)).       Radiology Studies: No results found.      Scheduled Meds: . bisacodyl  10 mg Oral Daily  . dexamethasone  2 mg Intravenous Q12H  . fentaNYL  25 mcg Transdermal Q72H  . lidocaine  2 patch Transdermal Q24H  . metoCLOPramide (REGLAN) injection  5 mg Intravenous TID  . metoprolol tartrate  5 mg Intravenous Q6H  . OLANZapine zydis  5 mg Oral QHS  . ondansetron (ZOFRAN) IV  4 mg Intravenous Q6H   Or  . ondansetron  4 mg Oral Q6H  . pantoprazole (PROTONIX) IV  40 mg Intravenous Q12H  . potassium chloride  40 mEq Oral Once  . pregabalin  25 mg Oral BID  . rivaroxaban  15 mg Oral BID WC   Followed by  . [START ON 11/23/2018] rivaroxaban  20 mg Oral Daily   Continuous Infusions: . sodium chloride 10 mL/hr at 11/02/18 0600  .  famotidine (PEPCID) IV 20 mg (11/02/18 1055)     LOS: 3 days     Georgette Shell, MD [facility] If 7PM-7AM, please contact night-coverage [email or web address] Password [email or web address] 11/02/2018, 11:41 AM

## 2018-11-03 ENCOUNTER — Inpatient Hospital Stay: Payer: Medicare Other

## 2018-11-03 LAB — CBC WITH DIFFERENTIAL/PLATELET
Abs Immature Granulocytes: 0.08 10*3/uL — ABNORMAL HIGH (ref 0.00–0.07)
Basophils Absolute: 0 10*3/uL (ref 0.0–0.1)
Basophils Relative: 1 %
EOS ABS: 1.3 10*3/uL — AB (ref 0.0–0.5)
EOS PCT: 38 %
HCT: 28.2 % — ABNORMAL LOW (ref 36.0–46.0)
HEMOGLOBIN: 8.8 g/dL — AB (ref 12.0–15.0)
Immature Granulocytes: 2 %
LYMPHS ABS: 0.3 10*3/uL — AB (ref 0.7–4.0)
Lymphocytes Relative: 9 %
MCH: 26.3 pg (ref 26.0–34.0)
MCHC: 31.2 g/dL (ref 30.0–36.0)
MCV: 84.2 fL (ref 80.0–100.0)
MONOS PCT: 15 %
Monocytes Absolute: 0.5 10*3/uL (ref 0.1–1.0)
Neutro Abs: 1.1 10*3/uL — ABNORMAL LOW (ref 1.7–7.7)
Neutrophils Relative %: 35 %
Platelets: 203 10*3/uL (ref 150–400)
RBC: 3.35 MIL/uL — ABNORMAL LOW (ref 3.87–5.11)
RDW: 16.2 % — ABNORMAL HIGH (ref 11.5–15.5)
WBC: 3.3 10*3/uL — ABNORMAL LOW (ref 4.0–10.5)
nRBC: 1.5 % — ABNORMAL HIGH (ref 0.0–0.2)

## 2018-11-03 LAB — BASIC METABOLIC PANEL
ANION GAP: 7 (ref 5–15)
BUN: 10 mg/dL (ref 8–23)
CO2: 28 mmol/L (ref 22–32)
CREATININE: 0.8 mg/dL (ref 0.44–1.00)
Calcium: 9 mg/dL (ref 8.9–10.3)
Chloride: 101 mmol/L (ref 98–111)
GFR calc Af Amer: >60 mL/min (ref >60)
GFR calc non Af Amer: >60 mL/min (ref >60)
GLUCOSE: 136 mg/dL — AB (ref 70–99)
Potassium: 4.1 mmol/L (ref 3.5–5.1)
Sodium: 136 mmol/L (ref 135–145)

## 2018-11-03 MED ORDER — BISACODYL 10 MG RE SUPP
10.0000 mg | Freq: Once | RECTAL | Status: DC
  Filled 2018-11-03: qty 1

## 2018-11-03 NOTE — Progress Notes (Signed)
PROGRESS NOTE    Faith [name]  NIO:[id number] DOB: 03/28/1951 DOA: 10/30/2018 PCP: Fanny Bien, MD   Brief Narrative:67 y.o.femalewith medical history significant ofmetastasized bladder cancer(s/p ofsurgery, radiation, last chemoon10/25), hypertension, hyperlipidemia, GERD, anxiety, anemia, who presents with intractable nauseaandvomiting for several days an burning circumferential upper abd and mid back pain.  Patient was recently hospitalized from 10/2-10/7 because of intractable nauseaandvomiting for almost 2 months. Patient hadEGD, which showed nonbleeding gastric ulcer with no stigmata of bleeding. Abdominal ultrasound showed gallbladder sludge without cholelithiasis or acute cholecystitis. PPIwasincreased to BID  In ED> WBC 2.8, platelet 121, hemoglobin 9.2 CT abdomen/pelvis showed metastasized liver disease, and possible right external iliac and femoral vein DVT.   Assessment & Plan:   Principal Problem:   Intractable nausea and vomiting Active Problems:   HTN (hypertension)   Urothelial carcinoma of bladder (HCC)   Hyperlipidemia   PUD (peptic ulcer disease)   Pancytopenia (HCC)   Anxiety   DVT (deep venous thrombosis) (HCC)   Intractable vomiting with nausea   Malnutrition of moderate degree   1 metastatic bladder cancer with mets to T7 vertebrae and possible compression syndrome she reports she has increased pain only when she moves.  However the pain is getting better on current treatment.  Dr. Alen Blew recommended palliative care discussion since her life expectancy was less than 6 months.  Patient started on Decadron 2 mg every 12, fentanyl 25 MCG, morphine and Lyrica.  PT eval noted patient reported that she was not even able to stand up properly with PT.  #2 intractable nausea and vomiting resolving patient it was able to tolerate p.o. Intake.  Seems to be better advance diet to regular diet.  #3 DVT right lower extremity started oN  Xarelto yesterday.  #4 hypertension remains elevated restart home medications.  #5 constipation stool softeners to be started today.  Still with no bowel movements will give Dulcolax suppository.      Malnutrition Type:  Nutrition Problem: Moderate Malnutrition Etiology: cancer and cancer related treatments, chronic illness   Malnutrition Characteristics:  Signs/Symptoms: percent weight loss, energy intake < or equal to 75% for > or equal to 1 month Percent weight loss: 10 %(x 1 month)   Nutrition Interventions:  Interventions: Refer to RD note for recommendations  Estimated body mass index is 26.83 kg/m as calculated from the following:   Height as of this encounter: 5\' 1"  (1.549 m).   Weight as of this encounter: 64.4 kg.  DVT prophylaxis: Xarelto Code Status: DO NOT RESUSCITATE Family Communication: Disposition Plan: None pending Home with hospice or residential hospice await further recommendations from palliative care hopefully she can be discharged tomorrow consultants:  Palliative care  Procedures: None Antimicrobials: None  Subjective: Resting in bed smiling reports when she moves it hurts otherwise does not heart has not had any nausea vomiting overnight has not had a BM since admission.  Objective: Vitals:   11/02/18 2220 11/03/18 0540 11/03/18 1223 11/03/18 1412  BP: (!) 114/56 (!) 170/91 (!) 164/90 (!) 152/74  Pulse: 76 64 76 64  Resp: 18 16  18   Temp: 97.9 F (36.6 C) 97.8 F (36.6 C)  97.8 F (36.6 C)  TempSrc: Oral Oral  Oral  SpO2: 96% 93%  97%  Weight:      Height:        Intake/Output Summary (Last 24 hours) at 11/03/2018 1425 Last data filed at 11/03/2018 0700 Gross per 24 hour  Intake 349.01 ml  Output 1200 ml  Net -850.99 ml   Filed Weights   10/30/18 1444  Weight: 64.4 kg    Examination:  General exam: Appears calm and comfortable  Respiratory system: Clear to auscultation. Respiratory effort normal. Cardiovascular  system: S1 & S2 heard, RRR. No JVD, murmurs, rubs, gallops or clicks. No pedal edema. Gastrointestinal system: Abdomen is nondistended, soft and tender. No organomegaly or masses felt. Normal bowel sounds heard. Central nervous system: Alert and oriented. No focal neurological deficits. Extremities: Symmetric 5 x 5 power. Skin: No rashes, lesions or ulcers Psychiatry: Judgement and insight appear normal. Mood & affect appropriate.     Data Reviewed: I have personally reviewed following labs and imaging studies  CBC: Recent Labs  Lab 10/30/18 0201 10/31/18 0553 11/01/18 0613 11/02/18 0617 11/03/18 0551  WBC 2.8* 1.1* 2.0* 2.4* 3.3*  NEUTROABS  --  0.7* 1.3* 1.3* 1.1*  HGB 9.2* 8.0* 8.1* 8.4* 8.8*  HCT 28.8* 25.6* 26.2* 26.9* 28.2*  MCV 80.7 81.5 82.9 84.1 84.2  PLT 121* 120* 147* 153 540   Basic Metabolic Panel: Recent Labs  Lab 10/30/18 0201 10/30/18 0205 10/31/18 0553 11/01/18 0613 11/02/18 0617 11/03/18 0551  NA 138  --  140 139 136 136  K 2.8*  --  4.0 5.0 4.9 4.1  CL 98  --  108 106 103 101  CO2 24  --  27 27 27 28   GLUCOSE 93  --  150* 155* 115* 136*  BUN 7*  --  <5* <5* <5* 10  CREATININE 0.58  --  0.48 0.68 0.78 0.80  CALCIUM 9.3  --  8.8* 9.2 9.6 9.0  MG  --  1.7 1.8  --   --   --    GFR: Estimated Creatinine Clearance: 59.4 mL/min (by C-G formula based on SCr of 0.8 mg/dL). Liver Function Tests: Recent Labs  Lab 10/30/18 0201  AST 23  ALT 14  ALKPHOS 85  BILITOT 1.0  PROT 7.0  ALBUMIN 3.1*   Recent Labs  Lab 10/30/18 0201  LIPASE 12   No results for input(s): AMMONIA in the last 168 hours. Coagulation Profile: Recent Labs  Lab 10/30/18 0538  INR 1.20   Cardiac Enzymes: No results for input(s): CKTOTAL, CKMB, CKMBINDEX, TROPONINI in the last 168 hours. BNP (last 3 results) No results for input(s): PROBNP in the last 8760 hours. HbA1C: No results for input(s): HGBA1C in the last 72 hours. CBG: No results for input(s): GLUCAP in the  last 168 hours. Lipid Profile: No results for input(s): CHOL, HDL, LDLCALC, TRIG, CHOLHDL, LDLDIRECT in the last 72 hours. Thyroid Function Tests: No results for input(s): TSH, T4TOTAL, FREET4, T3FREE, THYROIDAB in the last 72 hours. Anemia Panel: No results for input(s): VITAMINB12, FOLATE, FERRITIN, TIBC, IRON, RETICCTPCT in the last 72 hours. Sepsis Labs: Recent Labs  Lab 10/30/18 0211  LATICACIDVEN 1.20    No results found for this or any previous visit (from the past 240 hour(s)).       Radiology Studies: No results found.      Scheduled Meds: . amLODipine  10 mg Oral q morning - 10a  . bisacodyl  10 mg Oral Daily  . bisacodyl  10 mg Rectal Once  . dexamethasone  2 mg Intravenous Q12H  . fentaNYL  25 mcg Transdermal Q72H  . lidocaine  2 patch Transdermal Q24H  . metoCLOPramide (REGLAN) injection  5 mg Intravenous TID  . metoprolol tartrate  5 mg Intravenous Q6H  . OLANZapine zydis  5 mg Oral  QHS  . ondansetron (ZOFRAN) IV  4 mg Intravenous Q6H   Or  . ondansetron  4 mg Oral Q6H  . pantoprazole (PROTONIX) IV  40 mg Intravenous Q12H  . potassium chloride  40 mEq Oral Once  . pregabalin  25 mg Oral BID  . rivaroxaban  15 mg Oral BID WC   Followed by  . [START ON 11/23/2018] rivaroxaban  20 mg Oral Daily   Continuous Infusions: . sodium chloride 10 mL/hr at 11/03/18 0400  . famotidine (PEPCID) IV 20 mg (11/03/18 1012)     LOS: 4 days     Georgette Shell, MD [facility]  If 7PM-7AM, please contact night-coverage [email or web address] Password [email or web address] 11/03/2018, 2:25 PM

## 2018-11-04 LAB — BASIC METABOLIC PANEL
Anion gap: 6 (ref 5–15)
BUN: 14 mg/dL (ref 8–23)
CALCIUM: 9.1 mg/dL (ref 8.9–10.3)
CHLORIDE: 97 mmol/L — AB (ref 98–111)
CO2: 31 mmol/L (ref 22–32)
CREATININE: 1 mg/dL (ref 0.44–1.00)
GFR calc non Af Amer: 57 mL/min — ABNORMAL LOW (ref >60)
GLUCOSE: 132 mg/dL — AB (ref 70–99)
Potassium: 3.7 mmol/L (ref 3.5–5.1)
Sodium: 134 mmol/L — ABNORMAL LOW (ref 135–145)

## 2018-11-04 LAB — CBC WITH DIFFERENTIAL/PLATELET
BAND NEUTROPHILS: 4 %
BASOS PCT: 0 %
Basophils Absolute: 0 10*3/uL (ref 0.0–0.1)
EOS ABS: 0 10*3/uL (ref 0.0–0.5)
EOS PCT: 0 %
HCT: 28.5 % — ABNORMAL LOW (ref 36.0–46.0)
HEMOGLOBIN: 9.1 g/dL — AB (ref 12.0–15.0)
Lymphocytes Relative: 13 %
Lymphs Abs: 0.5 10*3/uL — ABNORMAL LOW (ref 0.7–4.0)
MCH: 26 pg (ref 26.0–34.0)
MCHC: 31.9 g/dL (ref 30.0–36.0)
MCV: 81.4 fL (ref 80.0–100.0)
METAMYELOCYTES PCT: 3 %
MONO ABS: 0.9 10*3/uL (ref 0.1–1.0)
MONOS PCT: 24 %
NRBC: 3 % — AB (ref 0.0–0.2)
Neutro Abs: 2.3 10*3/uL (ref 1.7–7.7)
Neutrophils Relative %: 56 %
Platelets: 257 10*3/uL (ref 150–400)
RBC: 3.5 MIL/uL — ABNORMAL LOW (ref 3.87–5.11)
RDW: 15.9 % — AB (ref 11.5–15.5)
WBC: 3.7 10*3/uL — AB (ref 4.0–10.5)

## 2018-11-04 MED ORDER — PANTOPRAZOLE SODIUM 40 MG PO TBEC
40.0000 mg | DELAYED_RELEASE_TABLET | Freq: Two times a day (BID) | ORAL | Status: DC
  Administered 2018-11-04 – 2018-11-05 (×3): 40 mg via ORAL
  Filled 2018-11-04 (×3): qty 1

## 2018-11-04 MED ORDER — BISACODYL 10 MG RE SUPP
10.0000 mg | Freq: Once | RECTAL | Status: AC
  Administered 2018-11-04: 10 mg via RECTAL
  Filled 2018-11-04: qty 1

## 2018-11-04 MED ORDER — LACTULOSE 10 GM/15ML PO SOLN
20.0000 g | Freq: Once | ORAL | Status: AC
  Administered 2018-11-04: 20 g via ORAL
  Filled 2018-11-04: qty 30

## 2018-11-04 MED ORDER — FAMOTIDINE 20 MG PO TABS
20.0000 mg | ORAL_TABLET | Freq: Two times a day (BID) | ORAL | Status: DC
  Administered 2018-11-04 – 2018-11-05 (×3): 20 mg via ORAL
  Filled 2018-11-04 (×3): qty 1

## 2018-11-04 NOTE — Progress Notes (Signed)
PROGRESS NOTE    Faith [name]  WJX:[id number] DOB: [birth year] DOA: 10/30/2018 PCP: Fanny Bien, MD  Brief Faith [name] y.o.femalewith medical history significant ofmetastasized bladder cancer(s/p ofsurgery, radiation, last chemoon10/25), hypertension, hyperlipidemia, GERD, anxiety, anemia, who presents with intractable nauseaandvomiting for several days an burning circumferential upper abd and mid back pain.  Patient was recently hospitalized from 10/2-10/7 because of intractable nauseaandvomiting for almost 2 months. Patient hadEGD, which showed nonbleeding gastric ulcer with no stigmata of bleeding. Abdominal ultrasound showed gallbladder sludge without cholelithiasis or acute cholecystitis. PPIwasincreased to BID  In ED> WBC 2.8, platelet 121, hemoglobin 9.2 CT abdomen/pelvis showed metastasized liver disease, and possible right external iliac and femoral vein DVT.  Assessment & Plan:   Principal Problem:   Intractable nausea and vomiting Active Problems:   HTN (hypertension)   Urothelial carcinoma of bladder (HCC)   Hyperlipidemia   PUD (peptic ulcer disease)   Pancytopenia (HCC)   Anxiety   DVT (deep venous thrombosis) (HCC)   Intractable vomiting with nausea   Malnutrition of moderate degree  1 metastatic bladder cancer with mets to T7 vertebrae and possible compression syndrome she reports she has increased pain only when she moves. However the pain is getting better on current treatment. Dr. Alen Blew recommended palliative care discussion since her life expectancy was less than 6 months. Patient started on Decadron 2 mg every 12, fentanyl 25 MCG, morphine and Lyrica. PT eval noted patient reported that she was not even able to stand up properly with PT.  #2 intractable nausea and vomiting resolving patient it was able to tolerate p.o. Intake.    Diet was advanced to regular diet last night and she is able to tolerate that.  #3 DVT right lower  extremity started oNXarelto yesterday.  #4 hypertension remains elevated restart home medications.  #5 constipation patient refused Dulcolax suppositories still has not had a BM.  Started on lactulose and Dulcolax.    Malnutrition Type:  Nutrition Problem: Moderate Malnutrition Etiology: cancer and cancer related treatments, chronic illness   Malnutrition Characteristics:  Signs/Symptoms: percent weight loss, energy intake < or equal to 75% for > or equal to 1 month Percent weight loss: 10 %(x 1 month)   Nutrition Interventions:  Interventions: Refer to RD note for recommendations  Estimated body mass index is 26.83 kg/m as calculated from the following:   Height as of this encounter: 5\' 1"  (1.549 m).   Weight as of this encounter: 64.4 kg.  DVT prophylaxis: Lovenox Code Status:DO NOT RESUSCITATE Family Communication: No family available Disposition Plan: Plan to discharge her home tomorrow when her sister comes into town for a week and then she will be admitted to [facility] to follow while she is at home.   Consultants: PALLIATIVE   Procedures:NONE Antimicrobials: none Subjective: Reports feeling better no nausea vomiting no diarrhea no abdominal pain however complains of constipation to the time of admission to the hospital.  Objective: Vitals:   11/03/18 1805 11/03/18 2053 11/04/18 0006 11/04/18 0528  BP: 131/79 (!) 152/87 (!) 166/88 (!) 151/81  Pulse: 69 65 63 (!) 56  Resp:  16  17  Temp:  98.2 F (36.8 C)  97.7 F (36.5 C)  TempSrc:  Oral  Oral  SpO2:  95%  100%  Weight:      Height:        Intake/Output Summary (Last 24 hours) at 11/04/2018 1117 Last data filed at 11/04/2018 0533 Gross per 24 hour  Intake -  Output  1950 ml  Net -1950 ml   Filed Weights   10/30/18 1444  Weight: 64.4 kg    Examination:  General exam: Appears calm and comfortable  Respiratory system: Clear to auscultation. Respiratory effort  normal. Cardiovascular system: S1 & S2 heard, RRR. No JVD, murmurs, rubs, gallops or clicks. No pedal edema. Gastrointestinal system: Abdomen is nondistended, soft and nontender. No organomegaly or masses felt. Normal bowel sounds heard. Central nervous system: Alert and oriented. No focal neurological deficits. Extremities: Symmetric 5 x 5 power. Skin: No rashes, lesions or ulcers Psychiatry: Judgement and insight appear normal. Mood & affect appropriate.     Data Reviewed: I have personally reviewed following labs and imaging studies  CBC: Recent Labs  Lab 10/31/18 0553 11/01/18 0613 11/02/18 0617 11/03/18 0551 11/04/18 0611  WBC 1.1* 2.0* 2.4* 3.3* 3.7*  NEUTROABS 0.7* 1.3* 1.3* 1.1* 2.3  HGB 8.0* 8.1* 8.4* 8.8* 9.1*  HCT 25.6* 26.2* 26.9* 28.2* 28.5*  MCV 81.5 82.9 84.1 84.2 81.4  PLT 120* 147* 153 203 350   Basic Metabolic Panel: Recent Labs  Lab 10/30/18 0205 10/31/18 0553 11/01/18 0613 11/02/18 0617 11/03/18 0551 11/04/18 0611  NA  --  140 139 136 136 134*  K  --  4.0 5.0 4.9 4.1 3.7  CL  --  108 106 103 101 97*  CO2  --  27 27 27 28 31   GLUCOSE  --  150* 155* 115* 136* 132*  BUN  --  <5* <5* <5* 10 14  CREATININE  --  0.48 0.68 0.78 0.80 1.00  CALCIUM  --  8.8* 9.2 9.6 9.0 9.1  MG 1.7 1.8  --   --   --   --    GFR: Estimated Creatinine Clearance: 47.5 mL/min (by C-G formula based on SCr of 1 mg/dL). Liver Function Tests: Recent Labs  Lab 10/30/18 0201  AST 23  ALT 14  ALKPHOS 85  BILITOT 1.0  PROT 7.0  ALBUMIN 3.1*   Recent Labs  Lab 10/30/18 0201  LIPASE 12   No results for input(s): AMMONIA in the last 168 hours. Coagulation Profile: Recent Labs  Lab 10/30/18 0538  INR 1.20   Cardiac Enzymes: No results for input(s): CKTOTAL, CKMB, CKMBINDEX, TROPONINI in the last 168 hours. BNP (last 3 results) No results for input(s): PROBNP in the last 8760 hours. HbA1C: No results for input(s): HGBA1C in the last 72 hours. CBG: No results for  input(s): GLUCAP in the last 168 hours. Lipid Profile: No results for input(s): CHOL, HDL, LDLCALC, TRIG, CHOLHDL, LDLDIRECT in the last 72 hours. Thyroid Function Tests: No results for input(s): TSH, T4TOTAL, FREET4, T3FREE, THYROIDAB in the last 72 hours. Anemia Panel: No results for input(s): VITAMINB12, FOLATE, FERRITIN, TIBC, IRON, RETICCTPCT in the last 72 hours. Sepsis Labs: Recent Labs  Lab 10/30/18 0211  LATICACIDVEN 1.20    No results found for this or any previous visit (from the past 240 hour(s)).       Radiology Studies: No results found.      Scheduled Meds: . amLODipine  10 mg Oral q morning - 10a  . bisacodyl  10 mg Oral Daily  . bisacodyl  10 mg Rectal Once  . bisacodyl  10 mg Rectal Once  . dexamethasone  2 mg Intravenous Q12H  . famotidine  20 mg Oral BID  . fentaNYL  25 mcg Transdermal Q72H  . lidocaine  2 patch Transdermal Q24H  . metoCLOPramide (REGLAN) injection  5 mg Intravenous TID  .  metoprolol tartrate  5 mg Intravenous Q6H  . OLANZapine zydis  5 mg Oral QHS  . ondansetron (ZOFRAN) IV  4 mg Intravenous Q6H   Or  . ondansetron  4 mg Oral Q6H  . pantoprazole  40 mg Oral BID  . potassium chloride  40 mEq Oral Once  . pregabalin  25 mg Oral BID  . rivaroxaban  15 mg Oral BID WC   Followed by  . [START ON 11/23/2018] rivaroxaban  20 mg Oral Daily   Continuous Infusions: . sodium chloride 10 mL/hr at 11/03/18 0400     LOS: 5 days     Georgette Shell, MD [facility]  If 7PM-7AM, please contact night-coverage [email or web address] Password [email or web address] 11/04/2018, 11:17 AM

## 2018-11-04 NOTE — Progress Notes (Signed)
Pharmacy IV to PO conversion  The patient is receiving Famotidine and Pantoprazole by the intravenous route.  Based on criteria approved by the Pharmacy and [place], the medication is being converted to the equivalent oral dose form.   No active GI bleeding or impaired absorption  Not s/p esophagectomy  Documented ability to take oral medications for > 24 hr  Plan to continue treatment for at least 1 day  If you have any questions about this conversion, please contact the Pharmacy Department (ext [phone]).  Thank you.  Reuel Boom, PharmD, BCPS [phone] 11/04/2018, 10:03 AM

## 2018-11-05 MED ORDER — PREGABALIN 25 MG PO CAPS: 25.0000 mg | ORAL_CAPSULE | Freq: Two times a day (BID) | ORAL | 0 refills | Status: AC

## 2018-11-05 MED ORDER — BISACODYL 5 MG PO TBEC: 10.0000 mg | DELAYED_RELEASE_TABLET | Freq: Every day | ORAL | Status: DC

## 2018-11-05 MED ORDER — LORAZEPAM 0.5 MG PO TABS: 0.5000 mg | ORAL_TABLET | Freq: Three times a day (TID) | ORAL | 0 refills | Status: AC | PRN

## 2018-11-05 MED ORDER — OLANZAPINE 5 MG PO TBDP: 5.0000 mg | ORAL_TABLET | Freq: Every day | ORAL | 0 refills | Status: AC

## 2018-11-05 MED ORDER — ONDANSETRON HCL 4 MG PO TABS: 4.0000 mg | ORAL_TABLET | Freq: Four times a day (QID) | ORAL | 0 refills | Status: DC

## 2018-11-05 MED ORDER — DEXAMETHASONE 0.5 MG PO TABS: 2.0000 mg | ORAL_TABLET | Freq: Two times a day (BID) | ORAL | 0 refills | Status: AC

## 2018-11-05 MED ORDER — RIVAROXABAN (XARELTO) VTE STARTER PACK (15 & 20 MG): ORAL_TABLET | ORAL | 0 refills | Status: AC

## 2018-11-05 MED ORDER — BISACODYL 5 MG PO TBEC: 10.0000 mg | DELAYED_RELEASE_TABLET | Freq: Every day | ORAL | 0 refills | Status: AC

## 2018-11-05 MED ORDER — MORPHINE SULFATE 30 MG PO TABS: 30.0000 mg | ORAL_TABLET | ORAL | 0 refills | Status: AC | PRN

## 2018-11-05 MED ORDER — FENTANYL 50 MCG/HR TD PT72: 50.0000 ug | MEDICATED_PATCH | TRANSDERMAL | 0 refills | Status: AC

## 2018-11-05 NOTE — Discharge Summary (Signed)
Physician Discharge Summary  Faith [name] UMP:[id number] DOB: 01/15/1951 DOA: 10/30/2018  PCP: Fanny Bien, MD  Admit date: 10/30/2018 Discharge date: 11/05/2018  Admitted From home  disposition: Home Recommendations for Outpatient Follow-up:  1. Hospice to follow patient at home   [place] none Equipment/Devices none Discharge Condition hospice discharge CODE STATUS: DO NOT RESUSCITATE Diet recommendation: Regular diet as tolerated  Brief/Interim Summary:67 y.o.femalewith medical history significant ofmetastasized bladder cancer(s/p ofsurgery, radiation, last chemoon10/25), hypertension, hyperlipidemia, GERD, anxiety, anemia, who presents with intractable nauseaandvomiting for several days an burning circumferential upper abd and mid back pain.  Patient was recently hospitalized from 10/2-10/7 because of intractable nauseaandvomiting for almost 2 months. Patient hadEGD, which showed nonbleeding gastric ulcer with no stigmata of bleeding. Abdominal ultrasound showed gallbladder sludge without cholelithiasis or acute cholecystitis. PPIwasincreased to BID  In ED> WBC 2.8, platelet 121, hemoglobin 9.2 CT abdomen/pelvis showed metastasized liver disease, and possible right external iliac and femoral vein DVT.   Discharge Diagnoses:  Principal Problem:   Intractable nausea and vomiting Active Problems:   HTN (hypertension)   Urothelial carcinoma of bladder (HCC)   Hyperlipidemia   PUD (peptic ulcer disease)   Pancytopenia (HCC)   Anxiety   DVT (deep venous thrombosis) (HCC)   Intractable vomiting with nausea   Malnutrition of moderate degree  1 metastatic bladder cancer with mets to T7 vertebrae and possible compression syndrome she reports she has increased pain only when she moves.  Patient is not able to stand or walk or move with physical therapy. However the pain is getting better on current treatment. Dr. Alen Blew recommended palliative care  discussion since her life expectancy was less than 6 months. Patient started on Decadron 2 mg every 12, fentanyl 25 MCG, morphine and Lyrica. PT evalnoted patient reported that she was not even able to stand up properly with PT. I will discharge her on fentanyl 50 MCG, MSIR, Lyrica, Decadron and stool softeners.  Patient expressed her desire to go home take care of some affairs and then go to inpatient hospice or [facility].  Her sister will be coming into town today who is going to be with her during this transition.  Hospice to follow patient at home.  She lives at home with her 36 year old son.  #2 intractable nausea and vomiting resolved.  She was able to tolerate diet.    #3 DVT right lower extremity started oNXarelto yesterday.  #4 hypertension remains elevated restart home medications.  #5 constipation patient had a BM last night.  Continue stool softeners.  Malnutrition Type:  Nutrition Problem: Moderate Malnutrition Etiology: cancer and cancer related treatments, chronic illness   Malnutrition Characteristics:  Signs/Symptoms: percent weight loss, energy intake < or equal to 75% for > or equal to 1 month Percent weight loss: 10 %(x 1 month)   Nutrition Interventions:  Interventions: Refer to RD note for recommendations  Estimated body mass index is 26.83 kg/m as calculated from the following:   Height as of this encounter: 5\' 1"  (1.549 m).   Weight as of this encounter: 64.4 kg.  Discharge Instructions  Discharge Instructions    Call MD for:  difficulty breathing, headache or visual disturbances   Complete by:  As directed    Call MD for:  persistant nausea and vomiting   Complete by:  As directed    Call MD for:  redness, tenderness, or signs of infection (pain, swelling, redness, odor or green/yellow discharge around incision site)   Complete by:  As directed    Diet - low sodium heart healthy   Complete by:  As directed    Increase activity slowly    Complete by:  As directed      Allergies as of 11/05/2018      Reactions   Tramadol Nausea And Vomiting      Medication List    STOP taking these medications   acetaminophen 325 MG tablet Commonly known as:  TYLENOL   fentaNYL 25 MCG/HR patch Commonly known as:  DURAGESIC - dosed mcg/hr Replaced by:  fentaNYL 50 MCG/HR   ferrous sulfate 325 (65 FE) MG tablet   rosuvastatin 5 MG tablet Commonly known as:  CRESTOR     TAKE these medications   amLODipine 10 MG tablet Commonly known as:  NORVASC Take 1 tablet (10 mg total) by mouth every morning.   bisacodyl 5 MG EC tablet Commonly known as:  DULCOLAX Take 2 tablets (10 mg total) by mouth daily.   dexamethasone 4 MG tablet Commonly known as:  DECADRON Take 1 tablet (4 mg total) by mouth 2 (two) times daily with a meal. Take it for 3 days after chemotherapy.   fentaNYL 50 MCG/HR Commonly known as:  DURAGESIC - dosed mcg/hr Place 1 patch (50 mcg total) onto the skin every 3 (three) days. Replaces:  fentaNYL 25 MCG/HR patch   lidocaine-prilocaine cream Commonly known as:  EMLA Apply 1 application topically as needed. What changed:  reasons to take this   LORazepam 0.5 MG tablet Commonly known as:  ATIVAN Take 1 tablet (0.5 mg total) by mouth every 8 (eight) hours as needed for anxiety. What changed:  when to take this   metoprolol succinate 100 MG 24 hr tablet Commonly known as:  TOPROL-XL Take 100 mg by mouth daily.   morphine 30 MG tablet Commonly known as:  MSIR Take 1 tablet (30 mg total) by mouth every 4 (four) hours as needed for severe pain.   OLANZapine zydis 5 MG disintegrating tablet Commonly known as:  ZYPREXA Take 1 tablet (5 mg total) by mouth at bedtime.   olmesartan 40 MG tablet Commonly known as:  BENICAR Take 40 mg by mouth daily.   omeprazole 40 MG capsule Commonly known as:  PRILOSEC Take 1 capsule (40 mg total) by mouth daily.   ondansetron 4 MG disintegrating tablet Commonly  known as:  ZOFRAN-ODT Take 1-2 tablets (4-8 mg total) by mouth every 8 (eight) hours as needed for nausea.   ondansetron 4 MG tablet Commonly known as:  ZOFRAN Take 1 tablet (4 mg total) by mouth every 6 (six) hours.   oxyCODONE-acetaminophen 5-325 MG tablet Commonly known as:  PERCOCET/ROXICET Take 1 tablet by mouth every 6 (six) hours as needed (for breakthrough pain).   pantoprazole 40 MG tablet Commonly known as:  PROTONIX Take 1 tablet (40 mg total) by mouth 2 (two) times daily before a meal. What changed:    when to take this  reasons to take this   polyethylene glycol packet Commonly known as:  MIRALAX / GLYCOLAX Take 17 g by mouth daily. What changed:    when to take this  reasons to take this   pregabalin 25 MG capsule Commonly known as:  LYRICA Take 1 capsule (25 mg total) by mouth 2 (two) times daily.   prochlorperazine 10 MG tablet Commonly known as:  COMPAZINE Take 1 tablet (10 mg total) by mouth every 6 (six) hours as needed for nausea or vomiting.   Rivaroxaban  15 & 20 MG Tbpk Take as directed on package: Start with one 15mg  tablet by mouth twice a day with food. On Day 22, switch to one 20mg  tablet once a day with food.   senna-docusate 8.6-50 MG tablet Commonly known as:  Senokot-S Take 1 tablet by mouth daily as needed. What changed:  reasons to take this      Follow-up Information    Fanny Bien, MD Follow up.   Specialty:  Family Medicine Contact information: [address] [place] [address] [phone]          Allergies  Allergen Reactions  . Tramadol Nausea And Vomiting    Consultations:  Palliative care   Procedures/Studies: Ct Abdomen Pelvis W Contrast  Result Date: 10/30/2018 CLINICAL DATA:  Acute abdominal pain. History of bladder cancer. EXAM: CT ABDOMEN AND PELVIS WITH CONTRAST TECHNIQUE: Multidetector CT imaging of the abdomen and pelvis was performed using the standard protocol following bolus  administration of intravenous contrast. CONTRAST:  113mL ISOVUE-300 IOPAMIDOL (ISOVUE-300) INJECTION 61% COMPARISON:  09/03/2018 FINDINGS: Lower chest: Small but increasing bilateral pleural effusions with basilar atelectasis. Greater on the left. Lung base nodules again demonstrated. Hepatobiliary: Multiple hypoenhancing liver lesions, likely metastatic. These are increased in conspicuity , size and number since previous study. Largest is in segment 6 and measures about 2.1 cm diameter. Layering density in the gallbladder likely sludge. No bile duct dilatation. Pancreas: Unremarkable. No pancreatic ductal dilatation or surrounding inflammatory changes. Spleen: Normal in size without focal abnormality. Adrenals/Urinary Tract: No adrenal gland nodules. Cyst on the left kidney. No hydronephrosis or hydroureter. Bladder is unremarkable. Stomach/Bowel: Stomach is within normal limits. Appendix is not identified. No evidence of bowel wall thickening, distention, or inflammatory changes. Vascular/Lymphatic: Aortic atherosclerosis. No enlarged abdominal or pelvic lymph nodes. Probable filling defect in the right external iliac and femoral veins suggesting DVT. Ultrasound correlation suggested. Reproductive: Uterus and bilateral adnexa are unremarkable. Other: Small umbilical hernia containing fat. No free air or free fluid in the abdomen. Musculoskeletal: Multiple sclerotic bone lesions consistent with metastases. Superior endplate compression at L1. IMPRESSION: 1. Small but increasing bilateral pleural effusions with basilar atelectasis, greater on the left. Lung base nodules again demonstrated. Likely metastatic. 2. Multiple hypoenhancing liver lesions, increased in conspicuity since previous study, likely representing metastatic disease. 3. Multiple sclerotic bone metastases. 4. Filling defect in the right external iliac and femoral veins suggesting DVT. Aortic Atherosclerosis (ICD10-I70.0). These results were called by  telephone at the time of interpretation on 10/30/2018 at 4:08 am to Dr. Ezequiel Essex , who verbally acknowledged these results. Electronically Signed   By: Lucienne Capers M.D.   On: 10/30/2018 04:10   Dg Abdomen Acute W/chest  Result Date: 10/30/2018 CLINICAL DATA:  Abdominal pain, nausea, and vomiting. History of bladder cancer. EXAM: DG ABDOMEN ACUTE W/ 1V CHEST COMPARISON:  09/28/2018 FINDINGS: A right jugular Port-A-Cath remains in place with tip over the mid SVC. The cardiomediastinal silhouette is unchanged with cardiac size upper limits of normal. Scarring or atelectasis is again noted in the left greater than right lower lungs. No sizable pleural effusion or pneumothorax is identified. No intraperitoneal free air is identified on the lateral decubitus radiograph. Scattered bowel gas is present throughout the abdomen. No dilated loops of bowel are seen to suggest obstruction. No acute osseous abnormality is identified. IMPRESSION: 1. Bibasilar scarring/atelectasis without evidence of acute cardiopulmonary disease. 2. Nonobstructed bowel gas pattern. Electronically Signed   By: Seymour Bars.D.  On: 10/30/2018 02:17   Vas [place] Lower Extremity Venous (dvt)  Result Date: 10/31/2018  Lower Venous Study Indications: Hx of dvt.  Performing Technologist: Abram Sander  Examination Guidelines: A complete evaluation includes B-mode imaging, spectral Doppler, color Doppler, and power Doppler as needed of all accessible portions of each vessel. Bilateral testing is considered an integral part of a complete examination. Limited examinations for reoccurring indications may be performed as noted.  Right Venous Findings: +---------+---------------+---------+-----------+----------+-------+          CompressibilityPhasicitySpontaneityPropertiesSummary +---------+---------------+---------+-----------+----------+-------+ CFV      None           No       No                   Acute    +---------+---------------+---------+-----------+----------+-------+ SFJ      None                                         Acute   +---------+---------------+---------+-----------+----------+-------+ FV Prox  None                                         Acute   +---------+---------------+---------+-----------+----------+-------+ FV Mid   Full                                                 +---------+---------------+---------+-----------+----------+-------+ FV DistalFull                                                 +---------+---------------+---------+-----------+----------+-------+ PFV      Full                                                 +---------+---------------+---------+-----------+----------+-------+ POP      None           Yes      Yes                  Acute   +---------+---------------+---------+-----------+----------+-------+ PTV      Full                                                 +---------+---------------+---------+-----------+----------+-------+ PERO     Full                                                 +---------+---------------+---------+-----------+----------+-------+  Left Venous Findings: +---------+---------------+---------+-----------+----------+-------+          CompressibilityPhasicitySpontaneityPropertiesSummary +---------+---------------+---------+-----------+----------+-------+ CFV      Full           Yes      Yes                          +---------+---------------+---------+-----------+----------+-------+  SFJ      Full                                                 +---------+---------------+---------+-----------+----------+-------+ FV Prox  Full                                                 +---------+---------------+---------+-----------+----------+-------+ FV Mid   Full                                                  +---------+---------------+---------+-----------+----------+-------+ FV DistalFull                                                 +---------+---------------+---------+-----------+----------+-------+ PFV      Full                                                 +---------+---------------+---------+-----------+----------+-------+ POP      Full           Yes      Yes                          +---------+---------------+---------+-----------+----------+-------+ PTV      Full                                                 +---------+---------------+---------+-----------+----------+-------+ PERO     Full                                                 +---------+---------------+---------+-----------+----------+-------+    Summary: Right: Findings consistent with acute deep vein thrombosis involving the right common femoral vein, right femoral vein, and right popliteal vein. No cystic structure found in the popliteal fossa. Left: There is no evidence of deep vein thrombosis in the lower extremity. No cystic structure found in the popliteal fossa.  *See table(s) above for measurements and observations. Electronically signed by Harold Barban MD on 10/31/2018 at 6:55:21 PM.    Final     (Echo, Carotid, EGD, Colonoscopy, ERCP)    Subjective:   Discharge Exam: Vitals:   11/05/18 0039 11/05/18 0532  BP: (!) 163/88 (!) 154/80  Pulse: 62 (!) 58  Resp: 15 16  Temp:  (!) 97.5 F (36.4 C)  SpO2: 98% 98%   Vitals:   11/04/18 1406 11/04/18 2055 11/05/18 0039 11/05/18 0532  BP: (!) 149/76 (!) 154/88 (!) 163/88 (!) 154/80  Pulse: (!) 55 80 62 (!) 58  Resp: 16 16 15 16   Temp: 97.7  F (36.5 C) 98.3 F (36.8 C)  (!) 97.5 F (36.4 C)  TempSrc: Oral Oral  Oral  SpO2:  99% 98% 98%  Weight:      Height:        General: Pt is alert, awake, not in acute distress Cardiovascular: RRR, S1/S2 +, no rubs, no gallops Respiratory: CTA bilaterally, no wheezing, no rhonchi Abdominal:  Soft, NT, ND, bowel sounds + Extremities: TRACE EDEMA    The results of significant diagnostics from this hospitalization (including imaging, microbiology, ancillary and laboratory) are listed below for reference.     Microbiology: No results found for this or any previous visit (from the past 240 hour(s)).   Labs: BNP (last 3 results) No results for input(s): BNP in the last 8760 hours. Basic Metabolic Panel: Recent Labs  Lab 10/30/18 0205 10/31/18 0553 11/01/18 8338 11/02/18 0617 11/03/18 0551 11/04/18 0611  NA  --  140 139 136 136 134*  K  --  4.0 5.0 4.9 4.1 3.7  CL  --  108 106 103 101 97*  CO2  --  27 27 27 28 31   GLUCOSE  --  150* 155* 115* 136* 132*  BUN  --  <5* <5* <5* 10 14  CREATININE  --  0.48 0.68 0.78 0.80 1.00  CALCIUM  --  8.8* 9.2 9.6 9.0 9.1  MG 1.7 1.8  --   --   --   --    Liver Function Tests: Recent Labs  Lab 10/30/18 0201  AST 23  ALT 14  ALKPHOS 85  BILITOT 1.0  PROT 7.0  ALBUMIN 3.1*   Recent Labs  Lab 10/30/18 0201  LIPASE 12   No results for input(s): AMMONIA in the last 168 hours. CBC: Recent Labs  Lab 10/31/18 0553 11/01/18 0613 11/02/18 0617 11/03/18 0551 11/04/18 0611  WBC 1.1* 2.0* 2.4* 3.3* 3.7*  NEUTROABS 0.7* 1.3* 1.3* 1.1* 2.3  HGB 8.0* 8.1* 8.4* 8.8* 9.1*  HCT 25.6* 26.2* 26.9* 28.2* 28.5*  MCV 81.5 82.9 84.1 84.2 81.4  PLT 120* 147* 153 203 257   Cardiac Enzymes: No results for input(s): CKTOTAL, CKMB, CKMBINDEX, TROPONINI in the last 168 hours. BNP: Invalid input(s): POCBNP CBG: No results for input(s): GLUCAP in the last 168 hours. D-Dimer No results for input(s): DDIMER in the last 72 hours. Hgb A1c No results for input(s): HGBA1C in the last 72 hours. Lipid Profile No results for input(s): CHOL, HDL, LDLCALC, TRIG, CHOLHDL, LDLDIRECT in the last 72 hours. Thyroid function studies No results for input(s): TSH, T4TOTAL, T3FREE, THYROIDAB in the last 72 hours.  Invalid input(s): FREET3 Anemia work  up No results for input(s): VITAMINB12, FOLATE, FERRITIN, TIBC, IRON, RETICCTPCT in the last 72 hours. Urinalysis    Component Value Date/Time   COLORURINE STRAW (A) 10/30/2018 1707   APPEARANCEUR CLEAR 10/30/2018 1707   LABSPEC 1.011 10/30/2018 1707   PHURINE 6.0 10/30/2018 1707   GLUCOSEU NEGATIVE 10/30/2018 1707   HGBUR SMALL (A) 10/30/2018 1707   BILIRUBINUR NEGATIVE 10/30/2018 1707   KETONESUR 80 (A) 10/30/2018 1707   PROTEINUR NEGATIVE 10/30/2018 1707   NITRITE NEGATIVE 10/30/2018 1707   LEUKOCYTESUR NEGATIVE 10/30/2018 1707   Sepsis Labs Invalid input(s): PROCALCITONIN,  WBC,  LACTICIDVEN Microbiology No results found for this or any previous visit (from the past 240 hour(s)).   Time coordinating discharge: 33  minutes  SIGNED:   Georgette Shell, MD  [facility] 11/05/2018, 9:32 AM  If 7PM-7AM, please contact night-coverage [email or web address] Password  [email or web address]

## 2018-11-05 NOTE — Progress Notes (Signed)
Patient discharged to home with family, discharge instructions reviewed with patient and son who verbalized understanding. New RX given to patient.

## 2018-11-05 NOTE — Progress Notes (Addendum)
[facility] -- [facility] RN note  Notified by Micheline Rough, MD with PMT of patient/family request for [facility] services at home after discharge. Chart and patient information under review by [facility] physician. Hospice eligibility has been confirmed.  Spoke with patient, patient's brother and sister-in-law to initiate education related to hospice philosophy, services and team approach to care. Patient/family verbalized understanding of information given. Per discussion, plan is for discharge today by private car.  DME needs discussed, patient currently has a shower chair, wheelchair, walker and 3 in 1 in the home. Patient has requested an OBT to be delivered to the home. [facility] has been notified to arrange delivery to the home. Home address has been verified and is correct in the chart. Marva is the person to contact to arrange time of equipment delivery.  Please send signed and completed out of facility DNR form home with patient/family.  Patient will need prescriptions for discharge comfort medications.  [facility] Referral Center aware of the above. Completed discharge summary will need to be faxed to [facility] at [phone] when final. Please notify [facility] when patient is ready to leave the unit at discharge by calling [phone]. [facility] information and contact numbers given to [facility] during this visit. Above information shared with Jonnie Finner, RNCM.  Please call with any hospice related questions or concerns.  Thank you for this referral.  Margaretmary Eddy, RN, BSN  [facility] Liaison [phone]  [place] are on AMION

## 2018-11-05 NOTE — Care Management Note (Addendum)
Case Management Note  Patient Details  Name: Faith [name] MRN: [id number] Date of Birth: 17-Aug-1951  Subjective/Objective:  Mets bladder cancer, nausea and vomiting                  Action/Plan: NCM spoke to pt and offered choice for Home Hospice/list provided. Pt requested [facility]. Contacted [facility] rep, Tracey with new referral. States her sister with stay her for two weeks and her son is there but works. Has RW at home.   11/05/2018 at 1102 Received call from [facility] rep, Tracey. States when she spoke with pt she wants to continue her [place] with [facility] for two weeks and then start with Home Hospice. Contacted attending for [facility] orders with F2F. Contacted [facility] for DME 3n1 to be delivered to room prior to dc. Provided pt with Xarelto 30 day free trial card.   Faxed referral to [place] for Palliative Services.   11/05/2018 243 pm. NCM spoke to pt, brother, sister in law and son at bedside. Discussed [place] vs Home Hospice. Pt and family has decided to dc home with Hospice. Contacted [facility] rep, Linus Orn to revisit pt and family. Contacted [facility] to cancel [facility]. Contacted [facility] and they do have Xarelto start pack and will hold for pt. She has Xarelto 30 day free trial card.   Expected Discharge Date:  11/05/18               Expected Discharge Plan:  [place]  In-House Referral:  NA  Discharge planning Services  CM Consult  Post Acute Care Choice:  Home Health, Resumption of Svcs/PTA Provider Choice offered to:  Patient  DME Arranged:  3-N-1 DME Agency:  [place]:  PT, OT, Nurse's Aide [place] Agency:  [facility]  Status of Service:  Completed, signed off  If discussed at [place] of Stay Meetings, dates discussed:    Additional Comments:  Erenest Rasher, RN 11/05/2018, 11:00 AM

## 2018-11-06 ENCOUNTER — Inpatient Hospital Stay: Payer: Medicare Other | Admitting: Oncology

## 2018-11-06 ENCOUNTER — Telehealth: Payer: Self-pay

## 2018-11-06 ENCOUNTER — Inpatient Hospital Stay: Payer: Medicare Other

## 2018-11-06 ENCOUNTER — Inpatient Hospital Stay: Payer: Medicare Other | Admitting: Nutrition

## 2018-11-06 NOTE — Telephone Encounter (Signed)
Received message that patient was dc'd home from hospital with hospice services and requesting that Dr. Alen Blew be the attending MD. [facility] [phone] spoke with Amber and confirmed that Dr. Alen Blew will be then attending. No other questions or concerns.

## 2018-11-27 ENCOUNTER — Ambulatory Visit: Payer: Medicare Other

## 2018-11-27 ENCOUNTER — Inpatient Hospital Stay: Payer: Medicare Other | Admitting: Oncology

## 2018-11-27 ENCOUNTER — Inpatient Hospital Stay: Payer: Medicare Other | Attending: Oncology

## 2018-11-27 ENCOUNTER — Inpatient Hospital Stay: Payer: Medicare Other

## 2018-11-27 MED ORDER — HEPARIN SOD (PORK) LOCK FLUSH 100 UNIT/ML IV SOLN
500.0000 [IU] | Freq: Once | INTRAVENOUS | Status: AC
  Filled 2018-11-27: qty 5

## 2018-11-27 MED ORDER — SODIUM CHLORIDE 0.9% FLUSH
10.0000 mL | Freq: Once | INTRAVENOUS | Status: AC
  Filled 2018-11-27: qty 10

## 2018-12-07 ENCOUNTER — Telehealth: Payer: Self-pay

## 2018-12-07 NOTE — Telephone Encounter (Signed)
Received a call from [place] with [facility] asking if Dr. Alen Blew would like to continue to manage the patient pain medications and refills or if the hospice MD, Dr. Tomasa Hosteller, should oversee. After speaking with Dr. Alen Blew informed Santiago Glad RN that the Hospice MD can take over management of pain medications. No other questions or concerns.

## 2019-02-16 ENCOUNTER — Encounter: Payer: Self-pay | Admitting: Oncology

## 2019-02-25 DEATH — deceased

## 2019-09-03 IMAGING — CT CT ABD-PELV W/ CM
2 of 5 series · 16 of 46 positions shown, 18 images · IV contrast (ISOVUE)
Comparison: 09/03/2018

CLINICAL DATA: Acute abdominal pain. History of bladder cancer.

EXAM:
CT ABDOMEN AND PELVIS WITH CONTRAST
TECHNIQUE: Multidetector CT imaging of the abdomen and pelvis was performed
using the standard protocol following bolus administration of
intravenous contrast.
CONTRAST:  100mL TE3WT4-1DD IOPAMIDOL (TE3WT4-1DD) INJECTION 61%

[Series 2: axial st · axial · 0.81mm/px · z∈[+968,+1328]mm · 13 of 82 slices shown, 15 images]
[im 5/82  soft-tissue]
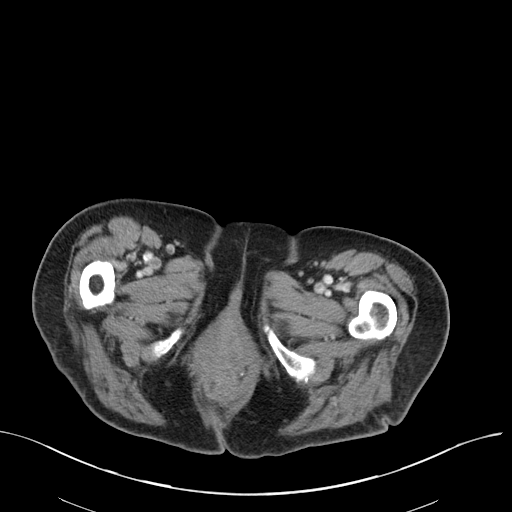
[im 5/82  bone]
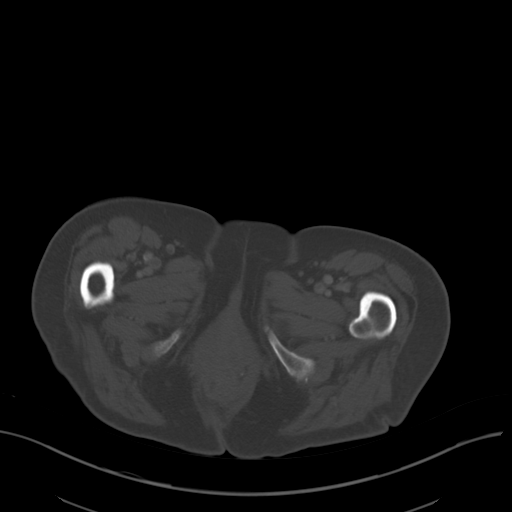
[im 10/82  soft-tissue]
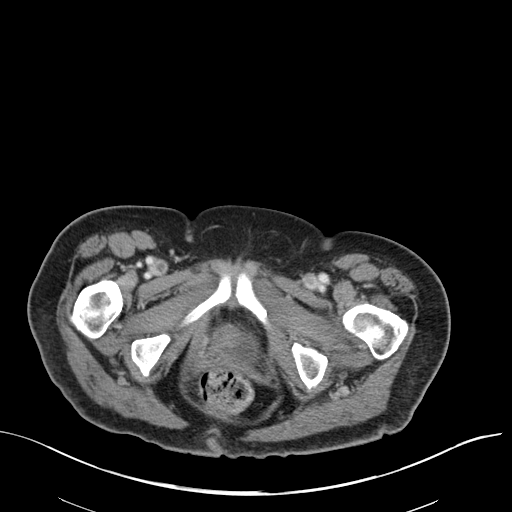
[im 20/82  soft-tissue]
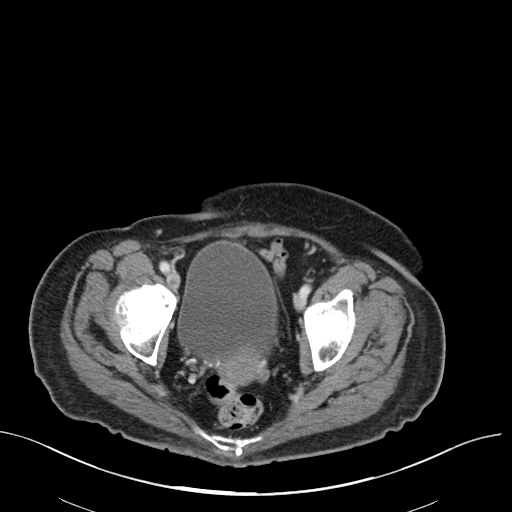
[im 24/82  soft-tissue]
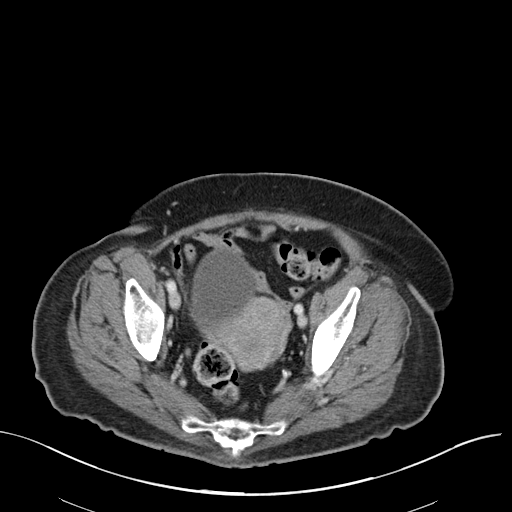
[im 29/82  soft-tissue]
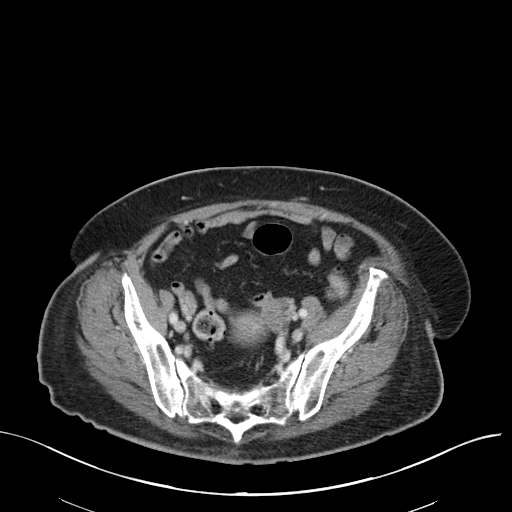
[im 34/82  soft-tissue]
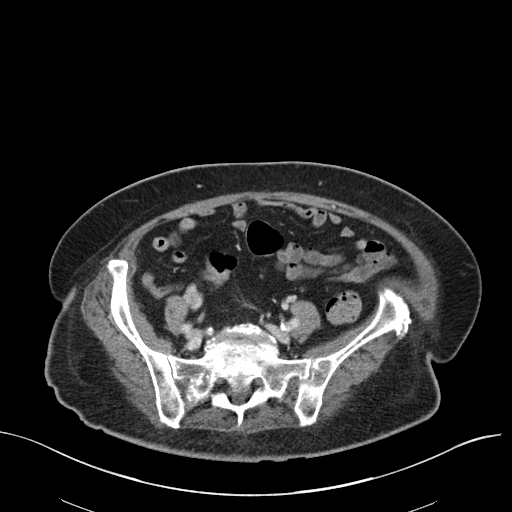
[im 43/82  soft-tissue]
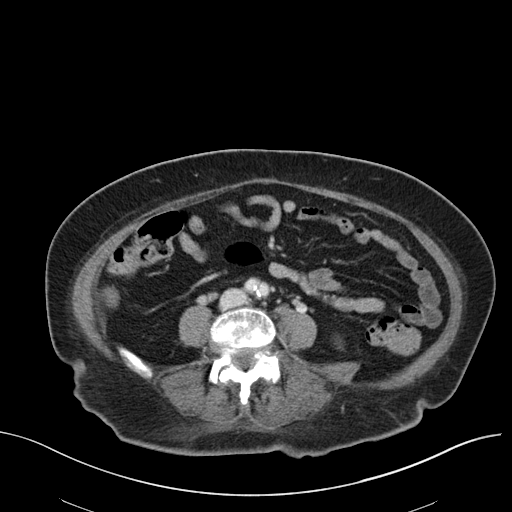
[im 48/82  soft-tissue]
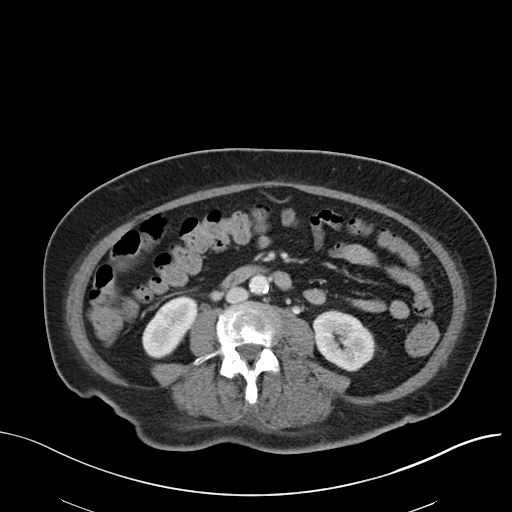
[im 53/82  soft-tissue]
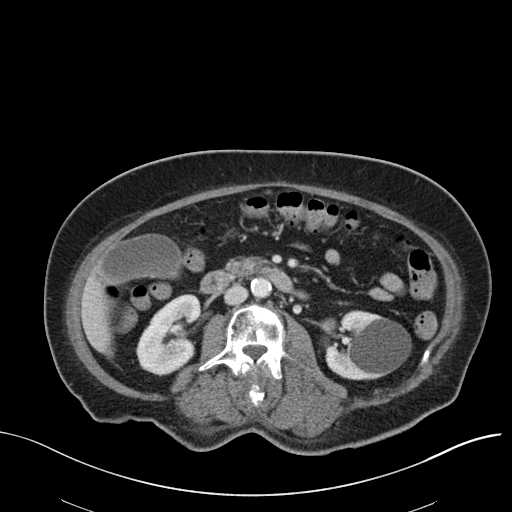
[im 53/82  bone]
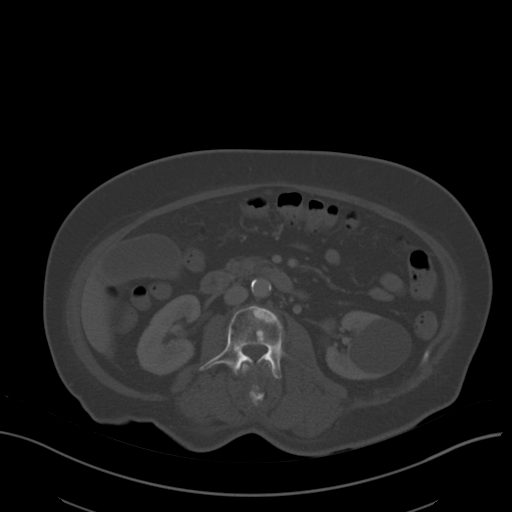
[im 58/82  soft-tissue]
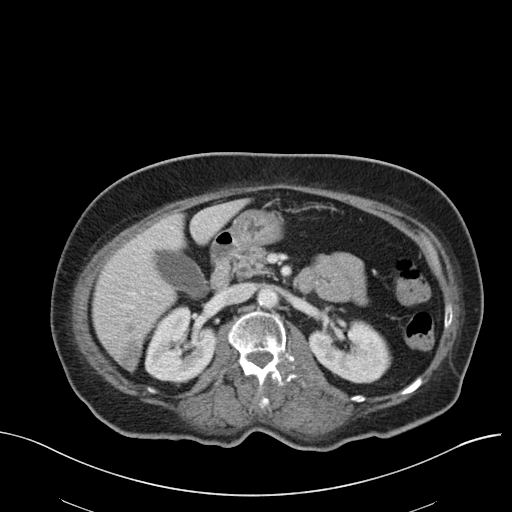
[im 62/82  soft-tissue]
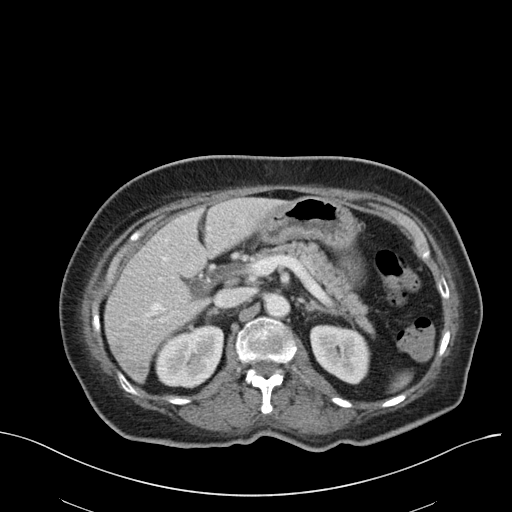
[im 72/82  soft-tissue]
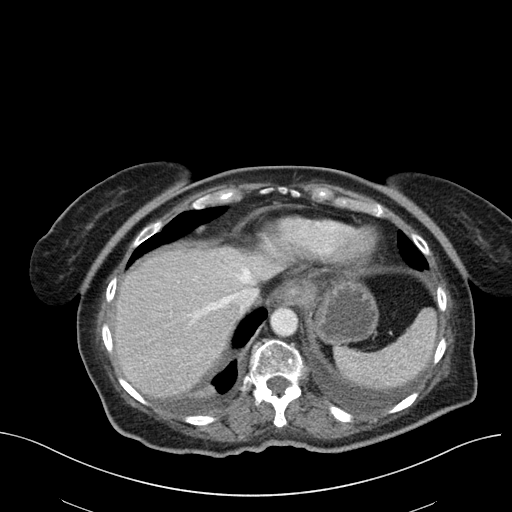
[im 77/82  soft-tissue]
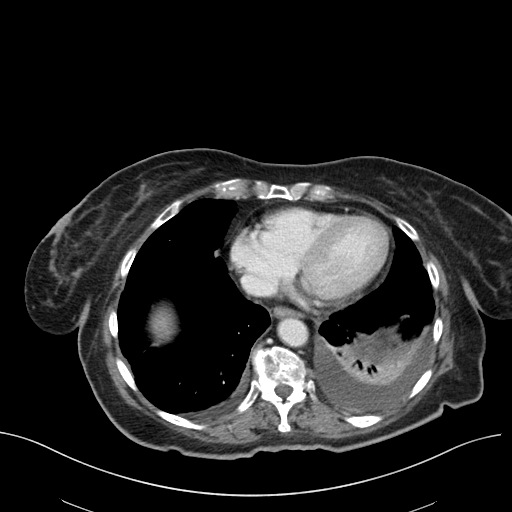

[Series 5: coronal st · coronal · 0.78mm/px · 3 of 91 slices shown]
[im 31/91  soft-tissue]
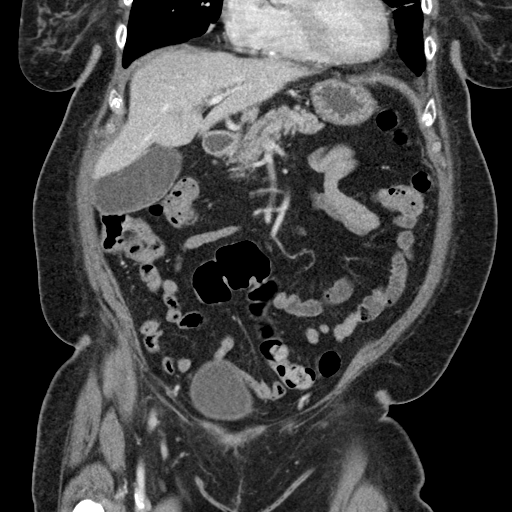
[im 41/91  soft-tissue]
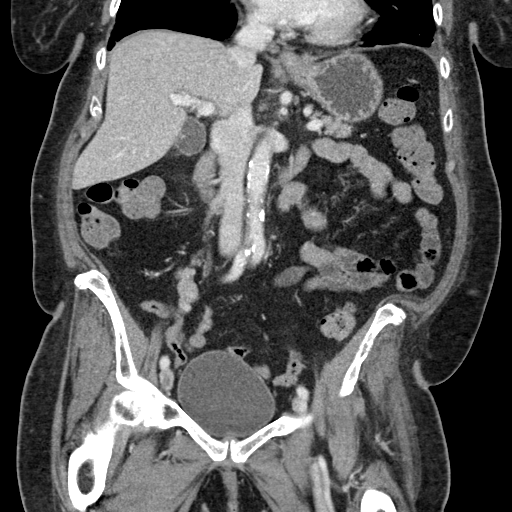
[im 51/91  soft-tissue]
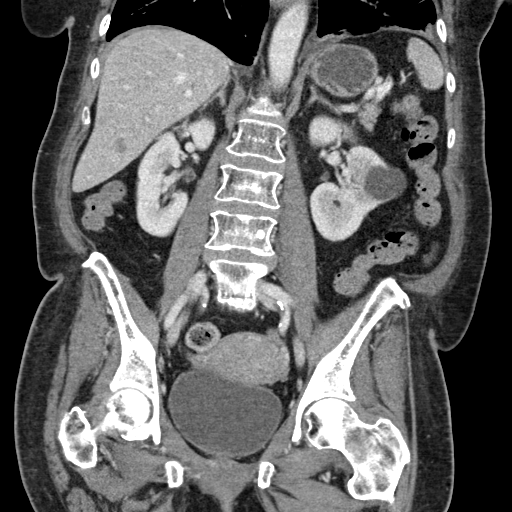

[16 of 46 positions shown; findings below may reference images not displayed]

FINDINGS: Lower chest: Small but increasing bilateral pleural effusions with
basilar atelectasis. Greater on the left. Lung base nodules again
demonstrated.

Hepatobiliary: Multiple hypoenhancing liver lesions, likely
metastatic. These are increased in conspicuity , size and number
since previous study. Largest is in segment 6 and measures about
cm diameter. Layering density in the gallbladder likely sludge. No
bile duct dilatation.

Pancreas: Unremarkable. No pancreatic ductal dilatation or
surrounding inflammatory changes.

Spleen: Normal in size without focal abnormality.

Adrenals/Urinary Tract: No adrenal gland nodules. Cyst on the left
kidney. No hydronephrosis or hydroureter. Bladder is unremarkable.

Stomach/Bowel: Stomach is within normal limits. Appendix is not
identified. No evidence of bowel wall thickening, distention, or
inflammatory changes.

Vascular/Lymphatic: Aortic atherosclerosis. No enlarged abdominal or
pelvic lymph nodes. Probable filling defect in the right external
iliac and femoral veins suggesting DVT. Ultrasound correlation
suggested.

Reproductive: Uterus and bilateral adnexa are unremarkable.

Other: Small umbilical hernia containing fat. No free air or free
fluid in the abdomen.

Musculoskeletal: Multiple sclerotic bone lesions consistent with
metastases. Superior endplate compression at L1.
IMPRESSION: 1. Small but increasing bilateral pleural effusions with basilar
atelectasis, greater on the left. Lung base nodules again
demonstrated. Likely metastatic.
2. Multiple hypoenhancing liver lesions, increased in conspicuity
since previous study, likely representing metastatic disease.
3. Multiple sclerotic bone metastases.
4. Filling defect in the right external iliac and femoral veins
suggesting DVT.

Aortic Atherosclerosis ([id number].I).

These results were called by telephone at the time of interpretation
on 10/30/2018 at [DATE] to Dr. DANIEL ESTUARDO KARLITA , who verbally
acknowledged these results.
